# Patient Record
Sex: Female | Born: 1990 | Race: White | Hispanic: No | Marital: Married | Smoking: Never smoker
Health system: Southern US, Community
[De-identification: ages and names within clinical notes are randomized; demographics above are authoritative.]

## PROBLEM LIST (undated history)

## (undated) ENCOUNTER — Inpatient Hospital Stay (HOSPITAL_COMMUNITY): Payer: Self-pay

## (undated) DIAGNOSIS — M797 Fibromyalgia: Secondary | ICD-10-CM

## (undated) DIAGNOSIS — O039 Complete or unspecified spontaneous abortion without complication: Secondary | ICD-10-CM

## (undated) DIAGNOSIS — K589 Irritable bowel syndrome without diarrhea: Secondary | ICD-10-CM

## (undated) DIAGNOSIS — F53 Postpartum depression: Secondary | ICD-10-CM

## (undated) DIAGNOSIS — F3181 Bipolar II disorder: Secondary | ICD-10-CM

## (undated) DIAGNOSIS — J45909 Unspecified asthma, uncomplicated: Secondary | ICD-10-CM

## (undated) DIAGNOSIS — O009 Unspecified ectopic pregnancy without intrauterine pregnancy: Secondary | ICD-10-CM

## (undated) DIAGNOSIS — M1990 Unspecified osteoarthritis, unspecified site: Secondary | ICD-10-CM

## (undated) DIAGNOSIS — E282 Polycystic ovarian syndrome: Secondary | ICD-10-CM

## (undated) DIAGNOSIS — F32A Depression, unspecified: Secondary | ICD-10-CM

## (undated) DIAGNOSIS — F329 Major depressive disorder, single episode, unspecified: Secondary | ICD-10-CM

## (undated) DIAGNOSIS — I7300 Raynaud's syndrome without gangrene: Secondary | ICD-10-CM

## (undated) DIAGNOSIS — F419 Anxiety disorder, unspecified: Secondary | ICD-10-CM

## (undated) DIAGNOSIS — F4310 Post-traumatic stress disorder, unspecified: Secondary | ICD-10-CM

## (undated) HISTORY — DX: Postpartum depression: F53.0

## (undated) HISTORY — DX: Irritable bowel syndrome, unspecified: K58.9

## (undated) HISTORY — PX: OTHER SURGICAL HISTORY: SHX169

## (undated) HISTORY — PX: DIAGNOSTIC LAPAROSCOPY WITH REMOVAL OF ECTOPIC PREGNANCY: SHX6449

## (undated) HISTORY — DX: Complete or unspecified spontaneous abortion without complication: O03.9

## (undated) HISTORY — DX: Unspecified ectopic pregnancy without intrauterine pregnancy: O00.90

---
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
## 2011-08-08 DIAGNOSIS — E039 Hypothyroidism, unspecified: Secondary | ICD-10-CM | POA: Insufficient documentation

## 2012-03-19 ENCOUNTER — Emergency Department (HOSPITAL_COMMUNITY): Admission: EM | Admit: 2012-03-19 | Discharge: 2012-03-19 | Discharge status: Against Medical Advice | Payer: Self-pay

## 2012-03-19 ENCOUNTER — Emergency Department (HOSPITAL_COMMUNITY): Payer: BC Managed Care – PPO

## 2012-03-19 ENCOUNTER — Encounter (HOSPITAL_COMMUNITY): Payer: Self-pay | Admitting: *Deleted

## 2012-03-19 ENCOUNTER — Emergency Department (HOSPITAL_COMMUNITY)
Admission: EM | Admit: 2012-03-19 | Discharge: 2012-03-19 | Disposition: A | Discharge status: Home / Self Care | Payer: BC Managed Care – PPO | Attending: Emergency Medicine | Admitting: Emergency Medicine

## 2012-03-19 DIAGNOSIS — R1011 Right upper quadrant pain: Secondary | ICD-10-CM

## 2012-03-19 DIAGNOSIS — R079 Chest pain, unspecified: Secondary | ICD-10-CM | POA: Insufficient documentation

## 2012-03-19 DIAGNOSIS — J45909 Unspecified asthma, uncomplicated: Secondary | ICD-10-CM | POA: Insufficient documentation

## 2012-03-19 HISTORY — DX: Unspecified asthma, uncomplicated: J45.909

## 2012-03-19 LAB — POCT PREGNANCY, URINE: Preg Test, Ur: NEGATIVE

## 2012-03-19 MED ORDER — SODIUM CHLORIDE 0.9 % IV BOLUS (SEPSIS): 1000.0000 mL | Freq: Once | INTRAVENOUS | Status: DC

## 2012-03-19 MED ORDER — ONDANSETRON HCL 4 MG/2ML IJ SOLN: 4.0000 mg | Freq: Once | INTRAMUSCULAR | Status: DC

## 2012-03-19 MED ORDER — MORPHINE SULFATE 4 MG/ML IJ SOLN: 4.0000 mg | Freq: Once | INTRAMUSCULAR | Status: DC

## 2012-03-19 NOTE — ED Notes (Signed)
Onset 3 days ago, c/o right rib pain esp with movement, no cough per pt.

## 2012-03-19 NOTE — ED Provider Notes (Signed)
History   This chart was scribed for Jamie Octave, MD by Jamie Waters . The patient was seen in room APA11/APA11. Patient's care was started at 1842.    CSN: [id number]  Arrival date & time 03/19/12  1605   First MD Initiated Contact with Patient 03/19/12 1842      Chief Complaint  Patient presents with  . Chest Pain    right rib pain    (Consider location/radiation/quality/duration/timing/severity/associated sxs/prior treatment) HPI Jamie Waters is a 21 y.o. female who presents to the Emergency Department complaining of intermittent, moderate RUQ abdominal pain for the past 3 days. Pt states that the current episode of abdominal pain has been constant since last night. Pt states that her symptoms are aggravated with sitting and relieved with laying down. Pt states that her symptoms are worsening. Patient reports associated constipation and diarrhea that go back and forth. Pt denies any nausea, vomiting, chest pain, back pain, coughing, fever, SOB, dysuria or rashes. Pt states that it doesn't hurt to take a deep breath. Pt denies any b/c or h/o abdominal surgeries. Pt reports a h/o asthma.   Past Medical History  Diagnosis Date  . Asthma     Past Surgical History  Procedure Date  . Cyst removed from throat     History reviewed. No pertinent family history.  History  Substance Use Topics  . Smoking status: Never Smoker   . Smokeless tobacco: Not on file  . Alcohol Use: No    OB History    Grav Para Term Preterm Abortions TAB SAB Ect Mult Living                  Review of Systems A complete 10 system review of systems was obtained and all systems are negative except as noted in the HPI and PMH.   Allergies  Review of patient's allergies indicates no known allergies.  Home Medications   Current Outpatient Rx  Name Route Sig Dispense Refill  . ALBUTEROL SULFATE HFA 108 (90 BASE) MCG/ACT IN AERS Inhalation Inhale 2 puffs into the lungs every 6 (six)  hours as needed.      BP 134/87  Pulse 117  Temp 98.4 F (36.9 C) (Oral)  Resp 18  Ht 5' (1.524 m)  Wt 186 lb (84.369 kg)  BMI 36.33 kg/m2  SpO2 100%  Physical Exam  Nursing note and vitals reviewed. Constitutional: She is oriented to person, place, and time. She appears well-developed and well-nourished. No distress.  HENT:  Head: Normocephalic and atraumatic.  Eyes: EOM are normal. Pupils are equal, round, and reactive to light.  Neck: Normal range of motion. Neck supple. No tracheal deviation present.  Cardiovascular: Normal rate, regular rhythm and normal heart sounds.   Pulmonary/Chest: Effort normal and breath sounds normal. No respiratory distress. She has no wheezes. She exhibits no tenderness.       No rib tenderness.   Abdominal: Soft. Bowel sounds are normal. She exhibits no distension. There is tenderness in the right upper quadrant. There is positive Murphy's sign. There is no rebound, no guarding and no CVA tenderness.       RUQ tenderness with positive Murphy's sign. No CVA tenderness.   Musculoskeletal: Normal range of motion. She exhibits no edema.  Neurological: She is alert and oriented to person, place, and time. No sensory deficit.  Skin: Skin is warm and dry.  Psychiatric: She has a normal mood and affect. Her behavior is normal.    ED  Course  Procedures (including critical care time)  DIAGNOSTIC STUDIES: Oxygen Saturation is 100% on room air, normal by my interpretation.    COORDINATION OF CARE:  18:58-Discussed planned course of treatment with the patient including lab work, CT scan, IV fluids and pain medication, who is agreeable at this time.      Labs Reviewed  POCT PREGNANCY, URINE   Dg Ribs Unilateral W/chest Right  03/19/2012  *RADIOLOGY REPORT*  Clinical Data: Dull chest pain in the right lateral ribs, no injury  RIGHT RIBS AND CHEST - 3+ VIEW  Comparison: None.  Findings: The lungs are clear.  Mediastinal contours appear normal. The  heart is within normal limits in size.  Surgical clips overlie the region of the left thyroid gland.  Right rib detail films show no acute right rib fracture.  IMPRESSION:  1.  No active lung disease. 2.  Negative right rib detail.  Original Report Authenticated By: Juline Patch, M.D.     No diagnosis found.    MDM  Right upper quadrant pain for the past 3 days worse with palpation and movement. Contrary to triage note no chest pain, rib pain, shortness of breath.  Concern for gallbladder pathology. Discussed with patient plan to obtain labs, urinalysis, CT scan as ultrasound is not available.  When nurse went in to start treatment, patient stated she wanted to go home and return tomorrow. She left before I could discuss the situation with her and has left AGAINST MEDICAL ADVICE.   I personally performed the services described in this documentation, which was scribed in my presence.  The recorded information has been reviewed and considered.     Jamie Octave, MD 03/19/12 1924

## 2012-03-19 NOTE — ED Notes (Signed)
Went into patient's room to assess patient and start IV, patient stated she wanted to go home and wanted to wait to have blood work and further testing done in the morning. Notified Dr. Manus Gunning, patient signed out AMA. Notified patient of risks and benefits of the possible treatment tonight and that she would have to check in to the ER again tomorrow if she wanted to be seen. Patient verbalized understanding.

## 2013-09-03 DIAGNOSIS — I7300 Raynaud's syndrome without gangrene: Secondary | ICD-10-CM

## 2013-09-03 DIAGNOSIS — E282 Polycystic ovarian syndrome: Secondary | ICD-10-CM

## 2013-09-03 HISTORY — DX: Polycystic ovarian syndrome: E28.2

## 2013-09-03 HISTORY — DX: Raynaud's syndrome without gangrene: I73.00

## 2013-12-31 ENCOUNTER — Emergency Department (HOSPITAL_COMMUNITY)
Admission: EM | Admit: 2013-12-31 | Discharge: 2013-12-31 | Disposition: A | Discharge status: Home / Self Care | Payer: BC Managed Care – PPO | Attending: Emergency Medicine | Admitting: Emergency Medicine

## 2013-12-31 ENCOUNTER — Encounter (HOSPITAL_COMMUNITY): Payer: Self-pay | Admitting: Emergency Medicine

## 2013-12-31 ENCOUNTER — Emergency Department (HOSPITAL_COMMUNITY): Payer: BC Managed Care – PPO

## 2013-12-31 DIAGNOSIS — T50905A Adverse effect of unspecified drugs, medicaments and biological substances, initial encounter: Secondary | ICD-10-CM

## 2013-12-31 DIAGNOSIS — R0789 Other chest pain: Secondary | ICD-10-CM | POA: Insufficient documentation

## 2013-12-31 DIAGNOSIS — R062 Wheezing: Secondary | ICD-10-CM | POA: Insufficient documentation

## 2013-12-31 DIAGNOSIS — T380X5A Adverse effect of glucocorticoids and synthetic analogues, initial encounter: Secondary | ICD-10-CM | POA: Insufficient documentation

## 2013-12-31 DIAGNOSIS — Z3202 Encounter for pregnancy test, result negative: Secondary | ICD-10-CM | POA: Insufficient documentation

## 2013-12-31 DIAGNOSIS — Z79899 Other long term (current) drug therapy: Secondary | ICD-10-CM | POA: Insufficient documentation

## 2013-12-31 DIAGNOSIS — T443X5A Adverse effect of other parasympatholytics [anticholinergics and antimuscarinics] and spasmolytics, initial encounter: Secondary | ICD-10-CM | POA: Insufficient documentation

## 2013-12-31 DIAGNOSIS — J45909 Unspecified asthma, uncomplicated: Secondary | ICD-10-CM | POA: Insufficient documentation

## 2013-12-31 DIAGNOSIS — T486X5A Adverse effect of antiasthmatics, initial encounter: Secondary | ICD-10-CM | POA: Insufficient documentation

## 2013-12-31 DIAGNOSIS — I7300 Raynaud's syndrome without gangrene: Secondary | ICD-10-CM | POA: Insufficient documentation

## 2013-12-31 HISTORY — DX: Raynaud's syndrome without gangrene: I73.00

## 2013-12-31 LAB — POC URINE PREG, ED: Preg Test, Ur: NEGATIVE

## 2013-12-31 MED ORDER — PREDNISONE 50 MG PO TABS: ORAL_TABLET | ORAL | Status: DC

## 2013-12-31 MED ORDER — ALBUTEROL SULFATE (2.5 MG/3ML) 0.083% IN NEBU
2.5000 mg | INHALATION_SOLUTION | Freq: Once | RESPIRATORY_TRACT | Status: AC
  Administered 2013-12-31: 2.5 mg via RESPIRATORY_TRACT
  Filled 2013-12-31: qty 3

## 2013-12-31 MED ORDER — DIPHENHYDRAMINE HCL 25 MG PO CAPS
25.0000 mg | ORAL_CAPSULE | Freq: Once | ORAL | Status: AC
  Administered 2013-12-31: 25 mg via ORAL
  Filled 2013-12-31: qty 1

## 2013-12-31 MED ORDER — ALBUTEROL SULFATE HFA 108 (90 BASE) MCG/ACT IN AERS
2.0000 | INHALATION_SPRAY | RESPIRATORY_TRACT | Status: DC
  Administered 2013-12-31: 2 via RESPIRATORY_TRACT
  Filled 2013-12-31: qty 6.7

## 2013-12-31 MED ORDER — IPRATROPIUM-ALBUTEROL 0.5-2.5 (3) MG/3ML IN SOLN
3.0000 mL | Freq: Once | RESPIRATORY_TRACT | Status: AC
  Administered 2013-12-31: 3 mL via RESPIRATORY_TRACT
  Filled 2013-12-31: qty 3

## 2013-12-31 MED ORDER — PREDNISONE 10 MG PO TABS
60.0000 mg | ORAL_TABLET | Freq: Once | ORAL | Status: AC
  Administered 2013-12-31: 60 mg via ORAL
  Filled 2013-12-31 (×2): qty 1

## 2013-12-31 NOTE — ED Provider Notes (Signed)
CSN: [id number]     Arrival date & time 12/31/13  1439 History   First MD Initiated Contact with Patient 12/31/13 1638     Chief Complaint  Patient presents with  . Shortness of Breath     (Consider location/radiation/quality/duration/timing/severity/associated sxs/prior Treatment) HPI Comments: Jamie Waters is a 23 y.o. Female presenting with probable allergic reaction to norvasc which she started 2 days ago for treatment of her Reynauds disease.  She took her first dose 2 nights ago and within 15 minutes noticed chest tightness, shortness of breath and wheezing.  The symptoms resolved until last night when she took her next dose.  Her wheezing is currently resolved, but still feels chest tightness.  She does have a distant history of asthma but not in recent years.  She contacted her rheumatologist who advised to present here and to dc her norvasc.  She denies chest pain,  Peripheral edema and lower extremity pain.  Also denies fevers or chills, nausea or vomiting.  She has had no mouth, lip or throat swelling.     The history is provided by the patient.    Past Medical History  Diagnosis Date  . Asthma   . Raynauds syndrome    Past Surgical History  Procedure Laterality Date  . Cyst removed from throat     No family history on file. History  Substance Use Topics  . Smoking status: Never Smoker   . Smokeless tobacco: Not on file  . Alcohol Use: No   OB History   Grav Para Term Preterm Abortions TAB SAB Ect Mult Living                 Review of Systems  Constitutional: Negative for fever.  HENT: Negative for congestion and sore throat.   Eyes: Negative.   Respiratory: Positive for chest tightness, shortness of breath and wheezing.   Cardiovascular: Negative for chest pain, palpitations and leg swelling.  Gastrointestinal: Negative for nausea and abdominal pain.  Genitourinary: Negative.   Musculoskeletal: Negative for arthralgias, joint swelling and neck pain.   Skin: Negative.  Negative for rash and wound.  Neurological: Negative for dizziness, weakness, light-headedness, numbness and headaches.  Psychiatric/Behavioral: Negative.       Allergies  Review of patient's allergies indicates no known allergies.  Home Medications   Prior to Admission medications   Medication Sig Start Date End Date Taking? Authorizing Provider  ALPRAZolam Prudy Feeler(XANAX) 0.5 MG tablet Take 0.5 mg by mouth at bedtime.   Yes Historical Provider, MD  amLODipine (NORVASC) 5 MG tablet Take 5 mg by mouth daily.   Yes Historical Provider, MD  buPROPion (WELLBUTRIN SR) 150 MG 12 hr tablet Take 300 mg by mouth daily.   Yes Historical Provider, MD  cyclobenzaprine (FLEXERIL) 10 MG tablet Take 5 mg by mouth at bedtime.   Yes Historical Provider, MD  SUMAtriptan (IMITREX) 100 MG tablet Take 100 mg by mouth every 2 (two) hours as needed for migraine or headache. May repeat in 2 hours if headache persists or recurs.   Yes Historical Provider, MD  topiramate (TOPAMAX) 50 MG tablet Take 200 mg by mouth at bedtime.   Yes Historical Provider, MD  Vitamin D, Ergocalciferol, (DRISDOL) 50000 UNITS CAPS capsule Take 50,000 Units by mouth every 7 (seven) days. Takes on Thursday   Yes Historical Provider, MD   BP 123/76  Pulse 107  Temp(Src) 97.7 F (36.5 C) (Oral)  Resp 18  SpO2 99% Physical Exam  Nursing note and  vitals reviewed. Constitutional: She appears well-developed and well-nourished.  HENT:  Head: Normocephalic and atraumatic.  Mouth/Throat: Uvula is midline, oropharynx is clear and moist and mucous membranes are normal. No posterior oropharyngeal edema.  Eyes: Conjunctivae are normal.  Neck: Normal range of motion.  Cardiovascular: Normal rate, regular rhythm, normal heart sounds and intact distal pulses.   Pulmonary/Chest: Effort normal and breath sounds normal. No respiratory distress. She has no wheezes. She exhibits no tenderness.  Slightly prolonged expiratory phase  without wheeze.  Abdominal: Soft. Bowel sounds are normal. There is no tenderness.  Musculoskeletal: Normal range of motion.  Neurological: She is alert.  Skin: Skin is warm and dry.  Psychiatric: She has a normal mood and affect.    ED Course  Procedures (including critical care time) Labs Review Labs Reviewed  POC URINE PREG, ED    Imaging Review Dg Chest 2 View  12/31/2013   CLINICAL DATA:  sob  EXAM: CHEST  2 VIEW  COMPARISON:  DG RIBS UNILATERAL W/CHEST*R* dated 03/19/2012  FINDINGS: The heart size and mediastinal contours are within normal limits. Both lungs are clear. The visualized skeletal structures are unremarkable.  IMPRESSION: No active cardiopulmonary disease.   Electronically Signed   By: Salome HolmesHector  Cooper M.D.   On: 12/31/2013 18:26     EKG Interpretation None      MDM   Final diagnoses:  Medication adverse effect    Pt was given albuterol/atrovent neb, prednisone 60 mg PO with resolution of chest tightness and sob.  At re-exam her respiratory exam was normal with no wheeze, no prolonged expirations, good air movement.  Short course prednisone pulse dose,  Albuterol mdi, dc norvasc,  Call her rheumatologist tomorrow for further guidance regarding alternative medication.      Burgess AmorJulie Jala Dundon, PA-C 12/31/13 1845

## 2013-12-31 NOTE — ED Notes (Signed)
Allergic reaction to Norvasc per pt.  Reports 2 days . Feels tightness in chest and " cant catch my breath" speak complete sentences. nad noted

## 2013-12-31 NOTE — Discharge Instructions (Signed)

## 2013-12-31 NOTE — ED Provider Notes (Signed)
Medical screening examination/treatment/procedure(s) were performed by non-physician practitioner and as supervising physician I was immediately available for consultation/collaboration.   EKG Interpretation None      Devoria AlbeIva Norvel Wenker, MD, Armando GangFACEP   Ward GivensIva L Hiroko Tregre, MD 12/31/13 [phone]

## 2014-10-27 ENCOUNTER — Inpatient Hospital Stay (HOSPITAL_COMMUNITY)
Admission: AD | Admit: 2014-10-27 | Discharge: 2014-10-27 | Disposition: A | Discharge status: Home / Self Care | Payer: BLUE CROSS/BLUE SHIELD | Source: Ambulatory Visit | Attending: Obstetrics | Admitting: Obstetrics

## 2014-10-27 ENCOUNTER — Inpatient Hospital Stay (HOSPITAL_COMMUNITY): Payer: BLUE CROSS/BLUE SHIELD

## 2014-10-27 ENCOUNTER — Encounter (HOSPITAL_COMMUNITY): Payer: Self-pay | Admitting: *Deleted

## 2014-10-27 DIAGNOSIS — O4691 Antepartum hemorrhage, unspecified, first trimester: Secondary | ICD-10-CM | POA: Diagnosis not present

## 2014-10-27 DIAGNOSIS — Z3A01 Less than 8 weeks gestation of pregnancy: Secondary | ICD-10-CM | POA: Diagnosis not present

## 2014-10-27 DIAGNOSIS — O2341 Unspecified infection of urinary tract in pregnancy, first trimester: Secondary | ICD-10-CM | POA: Diagnosis not present

## 2014-10-27 DIAGNOSIS — N390 Urinary tract infection, site not specified: Secondary | ICD-10-CM

## 2014-10-27 DIAGNOSIS — O209 Hemorrhage in early pregnancy, unspecified: Secondary | ICD-10-CM | POA: Diagnosis present

## 2014-10-27 HISTORY — DX: Fibromyalgia: M79.7

## 2014-10-27 HISTORY — DX: Polycystic ovarian syndrome: E28.2

## 2014-10-27 HISTORY — DX: Unspecified osteoarthritis, unspecified site: M19.90

## 2014-10-27 LAB — CBC
HCT: 39.4 % (ref 36.0–46.0)
Hemoglobin: 13 g/dL (ref 12.0–15.0)
MCH: 30.5 pg (ref 26.0–34.0)
MCHC: 33 g/dL (ref 30.0–36.0)
MCV: 92.5 fL (ref 78.0–100.0)
Platelets: 337 10*3/uL (ref 150–400)
RBC: 4.26 MIL/uL (ref 3.87–5.11)
RDW: 13.2 % (ref 11.5–15.5)
WBC: 12 10*3/uL — ABNORMAL HIGH (ref 4.0–10.5)

## 2014-10-27 LAB — URINALYSIS, ROUTINE W REFLEX MICROSCOPIC
Bilirubin Urine: NEGATIVE
Glucose, UA: NEGATIVE mg/dL
Ketones, ur: 15 mg/dL — AB
Nitrite: NEGATIVE
Protein, ur: NEGATIVE mg/dL
Specific Gravity, Urine: 1.025 (ref 1.005–1.030)
Urobilinogen, UA: 0.2 mg/dL (ref 0.0–1.0)
pH: 6 (ref 5.0–8.0)

## 2014-10-27 LAB — WET PREP, GENITAL
Trich, Wet Prep: NONE SEEN
Yeast Wet Prep HPF POC: NONE SEEN

## 2014-10-27 LAB — URINE MICROSCOPIC-ADD ON

## 2014-10-27 LAB — POCT PREGNANCY, URINE: Preg Test, Ur: POSITIVE — AB

## 2014-10-27 LAB — HCG, QUANTITATIVE, PREGNANCY: hCG, Beta Chain, Quant, S: 401 m[IU]/mL — ABNORMAL HIGH (ref <5)

## 2014-10-27 MED ORDER — RHO D IMMUNE GLOBULIN 1500 UNIT/2ML IJ SOSY
300.0000 ug | PREFILLED_SYRINGE | Freq: Once | INTRAMUSCULAR | Status: AC
  Administered 2014-10-27: 300 ug via INTRAMUSCULAR
  Filled 2014-10-27: qty 2

## 2014-10-27 MED ORDER — CEPHALEXIN 500 MG PO CAPS: 500.0000 mg | ORAL_CAPSULE | Freq: Four times a day (QID) | ORAL | Status: DC

## 2014-10-27 MED ORDER — PHENAZOPYRIDINE HCL 200 MG PO TABS: 200.0000 mg | ORAL_TABLET | Freq: Three times a day (TID) | ORAL | Status: DC

## 2014-10-27 NOTE — MAU Provider Note (Signed)
History     CSN: [id number]  Arrival date and time: 10/27/14 1611   First Provider Initiated Contact with Patient 10/27/14 1822      Chief Complaint  Patient presents with  . Abdominal Pain  . Vaginal Bleeding   HPI   Jamie Waters is a 24 y.o. female G1P0 at [redacted]w[redacted]d who presents with vaginal bleeding. The bleeding started last night around 9:00 pm, she called the Dr. Isidore Moos today and described it as light. This afternoon the bleeding picked up to heavier than spotting and it is now bright red.   She denies abdominal pain however does have some lower back pain.   OB History    Gravida Para Term Preterm AB TAB SAB Ectopic Multiple Living   1         0      Past Medical History  Diagnosis Date  . Asthma   . Raynauds syndrome   . Raynauds syndrome 2015  . Fibromyalgia   . Arthritis   . PCOS (polycystic ovarian syndrome) 2015    Past Surgical History  Procedure Laterality Date  . Cyst removed from throat    . Thyroidectomy, partial  2010    History reviewed. No pertinent family history.  History  Substance Use Topics  . Smoking status: Never Smoker   . Smokeless tobacco: Not on file  . Alcohol Use: No    Allergies:  Allergies  Allergen Reactions  . Norvasc [Amlodipine Besylate] Shortness Of Breath and Other (See Comments)    Caused tightness in chest.    No prescriptions prior to admission   Results for orders placed or performed during the hospital encounter of 10/27/14 (from the past 48 hour(s))  Urinalysis, Routine w reflex microscopic     Status: Abnormal   Collection Time: 10/27/14  4:40 PM  Result Value Ref Range   Color, Urine YELLOW YELLOW   APPearance CLEAR CLEAR   Specific Gravity, Urine 1.025 1.005 - 1.030   pH 6.0 5.0 - 8.0   Glucose, UA NEGATIVE NEGATIVE mg/dL   Hgb urine dipstick LARGE (A) NEGATIVE   Bilirubin Urine NEGATIVE NEGATIVE   Ketones, ur 15 (A) NEGATIVE mg/dL   Protein, ur NEGATIVE NEGATIVE mg/dL   Urobilinogen, UA 0.2  0.0 - 1.0 mg/dL   Nitrite NEGATIVE NEGATIVE   Leukocytes, UA SMALL (A) NEGATIVE  Urine microscopic-add on     Status: Abnormal   Collection Time: 10/27/14  4:40 PM  Result Value Ref Range   Squamous Epithelial / LPF RARE RARE   WBC, UA 11-20 <3 WBC/hpf   RBC / HPF 21-50 <3 RBC/hpf   Bacteria, UA MANY (A) RARE  Pregnancy, urine POC     Status: Abnormal   Collection Time: 10/27/14  4:46 PM  Result Value Ref Range   Preg Test, Ur POSITIVE (A) NEGATIVE    Comment:        THE SENSITIVITY OF THIS METHODOLOGY IS >24 mIU/mL   Wet prep, genital     Status: Abnormal   Collection Time: 10/27/14  6:27 PM  Result Value Ref Range   Yeast Wet Prep HPF POC NONE SEEN NONE SEEN   Trich, Wet Prep NONE SEEN NONE SEEN   Clue Cells Wet Prep HPF POC FEW (A) NONE SEEN   WBC, Wet Prep HPF POC FEW (A) NONE SEEN    Comment: FEW BACTERIA SEEN  CBC     Status: Abnormal   Collection Time: 10/27/14  9:15 PM  Result  Value Ref Range   WBC 12.0 (H) 4.0 - 10.5 K/uL   RBC 4.26 3.87 - 5.11 MIL/uL   Hemoglobin 13.0 12.0 - 15.0 g/dL   HCT 21.3 08.6 - 57.8 %   MCV 92.5 78.0 - 100.0 fL   MCH 30.5 26.0 - 34.0 pg   MCHC 33.0 30.0 - 36.0 g/dL   RDW 46.9 62.9 - 52.8 %   Platelets 337 150 - 400 K/uL  hCG, quantitative, pregnancy     Status: Abnormal   Collection Time: 10/27/14  9:15 PM  Result Value Ref Range   hCG, Beta Chain, Quant, S 401 (H) <5 mIU/mL    Comment:          GEST. AGE      CONC.  (mIU/mL)   <=1 WEEK        5 - 50     2 WEEKS       50 - 500     3 WEEKS       100 - 10,000     4 WEEKS     1,000 - 30,000     5 WEEKS     3,500 - 115,000   6-8 WEEKS     12,000 - 270,000    12 WEEKS     15,000 - 220,000        FEMALE AND NON-PREGNANT FEMALE:     LESS THAN 5 mIU/mL   ABO/Rh     Status: None (Preliminary result)   Collection Time: 10/27/14  9:15 PM  Result Value Ref Range   ABO/RH(D) A NEG   Rh IG workup (includes ABO/Rh)     Status: None (Preliminary result)   Collection Time: 10/27/14  9:15 PM   Result Value Ref Range   Gestational Age(Wks) 5    ABO/RH(D) A NEG    Antibody Screen NEG    Unit Number [id number]    Blood Component Type RHIG    Unit division 00    Status of Unit ISSUED    Transfusion Status OK TO TRANSFUSE    US Ob Comp Less 14 Wks  10/27/2014   CLINICAL DATA:  Spotting last night and today. Bleeding heavier today. Mild cramping. Positive urine pregnancy test. Estimated gestational age by LMP is 5 weeks 4 days.  EXAM: OBSTETRIC <14 WK [place] AND TRANSVAGINAL OB US  TECHNIQUE: Both transabdominal and transvaginal ultrasound examinations were performed for complete evaluation of the gestation as well as the maternal uterus, adnexal regions, and pelvic cul-de-sac. Transvaginal technique was performed to assess early pregnancy.  COMPARISON:  None.  FINDINGS: Intrauterine gestational sac: There is a tiny intrauterine fluid collection which may represent an early intrauterine gestational sac. This is equivocal. No definite decidual reaction is noted.  Yolk sac:  Not identified  Embryo:  Not identified  Cardiac Activity: Not identified  MSD: 3.5  mm   4 w   6  d                [place] EDC: 06/30/2015  Maternal uterus/adnexae: Uterus is anteverted. No myometrial masses. Cervix is normal. Both ovaries are identified. Complex cystic structure in the right ovary with surrounding flow on color flow Doppler imaging likely represents a corpus luteal cyst. No free fluid.  IMPRESSION: Possible early intrauterine gestational sac, but no yolk sac, fetal pole, or cardiac activity yet visualized. Recommend follow-up quantitative B-HCG levels and follow-up US in 14 days to confirm and assess viability. This recommendation follows SRU consensus guidelines:  Diagnostic Criteria for Nonviable Pregnancy Early in the First Trimester. Malva Limes Engl J Med 2013; 161:0960-45; 369:1443-51.   Electronically Signed   By: Burman NievesWilliam  Stevens M.D.   On: 10/27/2014 20:07   [place] Ob Transvaginal  10/27/2014   CLINICAL DATA:  Spotting last night  and today. Bleeding heavier today. Mild cramping. Positive urine pregnancy test. Estimated gestational age by LMP is 5 weeks 4 days.  EXAM: OBSTETRIC <14 WK US AND TRANSVAGINAL OB US  TECHNIQUE: Both transabdominal and transvaginal ultrasound examinations were performed for complete evaluation of the gestation as well as the maternal uterus, adnexal regions, and pelvic cul-de-sac. Transvaginal technique was performed to assess early pregnancy.  COMPARISON:  None.  FINDINGS: Intrauterine gestational sac: There is a tiny intrauterine fluid collection which may represent an early intrauterine gestational sac. This is equivocal. No definite decidual reaction is noted.  Yolk sac:  Not identified  Embryo:  Not identified  Cardiac Activity: Not identified  MSD: 3.5  mm   4 w   6  d                [place] EDC: 06/30/2015  Maternal uterus/adnexae: Uterus is anteverted. No myometrial masses. Cervix is normal. Both ovaries are identified. Complex cystic structure in the right ovary with surrounding flow on color flow Doppler imaging likely represents a corpus luteal cyst. No free fluid.  IMPRESSION: Possible early intrauterine gestational sac, but no yolk sac, fetal pole, or cardiac activity yet visualized. Recommend follow-up quantitative B-HCG levels and follow-up US in 14 days to confirm and assess viability. This recommendation follows SRU consensus guidelines: Diagnostic Criteria for Nonviable Pregnancy Early in the First Trimester. Malva Limes Engl J Med 2013; 409:8119-14; 369:1443-51.   Electronically Signed   By: Burman NievesWilliam  Stevens M.D.   On: 10/27/2014 20:07     Review of Systems  Constitutional: Negative for fever and chills.  Gastrointestinal: Positive for nausea. Negative for abdominal pain.  Genitourinary: Negative for dysuria, urgency and frequency.  Musculoskeletal: Positive for back pain (Both sides of lower back. ).   Physical Exam   Blood pressure 129/71, pulse 125, temperature 98.9 F (37.2 C), temperature source Oral, resp.  rate 16, height 5' (1.524 m), weight 177 lb (80.287 kg), last menstrual period 09/18/2014, SpO2 100 %.  Physical Exam  Constitutional: She is oriented to person, place, and time. She appears well-developed and well-nourished. No distress.  HENT:  Head: Normocephalic.  Eyes: Pupils are equal, round, and reactive to light.  Neck: Neck supple.  Respiratory: Effort normal.  GI: Soft. She exhibits no distension. There is tenderness in the suprapubic area. There is no rigidity and no guarding.  Genitourinary:  Speculum exam: Vagina - Moderate amount of dark red blood pooling in the vagina.  Cervix -+ active bleeding from cervix  Bimanual exam: Cervix closed, no CMT  Uterus non tender, normal size Adnexa non tender, no masses bilaterally, +suprapubic tenderness  GC/Chlam, wet prep done Chaperone present for exam.   Musculoskeletal: Normal range of motion.  Neurological: She is alert and oriented to person, place, and time.  Skin: Skin is warm. She is not diaphoretic.  Psychiatric: Her behavior is normal.    MAU Course  Procedures  None  MDM UA Urine culture sent  US ABO Quant CBC Wet prep GC HIV   Report given to Vonzella NippleJulie Katianne Barre [facility] who resumes care of the patient; patient is currently in US.   Iona HansenJennifer Irene Rasch, NP 10/27/2014 7:51 PM  MDM Discussed patient with Dr. Chestine Sporelark,  advised of delay in lab due to difficult draw. She recommends that we at least check ABO/Rh and quant hCG.  A negative blood type - Rhogam work-up and injection ordered Rhogam given today  Assessment and Plan  A: [redacted]w[redacted]d IUGS without YS or FP Vaginal bleeding in pregnancy UTI in pregnancy   P: Discharge home Rhogam given in MAU today  Bleeding precautions discussed Patient advised to return to MAU for follow-up labs on Saturday morning or sooner PRN  Marny Lowenstein, PA-C 10/28/2014 12:32 AM

## 2014-10-27 NOTE — MAU Note (Signed)
Noted light spotting last night, still spotting this a.m.  Mild cramping today.  States bleeding is heavier this afternoon.  Nauseated, but no vomiting or diarrhea today.  Feels flushed, hasn't taken temp @ home.

## 2014-10-27 NOTE — Discharge Instructions (Signed)
Pelvic Rest °Pelvic rest is sometimes recommended for women when:  °· The placenta is partially or completely covering the opening of the cervix (placenta previa). °· There is bleeding between the uterine wall and the amniotic sac in the first trimester (subchorionic hemorrhage). °· The cervix begins to open without labor starting (incompetent cervix, cervical insufficiency). °· The labor is too early (preterm labor). °HOME CARE INSTRUCTIONS °· Do not have sexual intercourse, stimulation, or an orgasm. °· Do not use tampons, douche, or put anything in the vagina. °· Do not lift anything over 10 pounds (4.5 kg). °· Avoid strenuous activity or straining your pelvic muscles. °SEEK MEDICAL CARE IF:  °· You have any vaginal bleeding during pregnancy. Treat this as a potential emergency. °· You have cramping pain felt low in the stomach (stronger than menstrual cramps). °· You notice vaginal discharge (watery, mucus, or bloody). °· You have a low, dull backache. °· There are regular contractions or uterine tightening. °SEEK IMMEDIATE MEDICAL CARE IF: °You have vaginal bleeding and have placenta previa.  °Document Released: 12/15/2010 Document Revised: 11/12/2011 Document Reviewed: 12/15/2010 °ExitCare® Patient Information ©2015 [employer]. This information is not intended to replace advice given to you by your health care provider. Make sure you discuss any questions you have with your health care provider. ° °Vaginal Bleeding During Pregnancy, First Trimester °A small amount of bleeding (spotting) from the vagina is common in early pregnancy. Sometimes the bleeding is normal and is not a problem, and sometimes it is a sign of something serious. Be sure to tell your doctor about any bleeding from your vagina right away. °HOME CARE °· Watch your condition for any changes. °· Follow your doctor's instructions about how active you can be. °· If you are on bed rest: °¨ You may need to stay in bed and only get up to use the  bathroom. °¨ You may be allowed to do some activities. °¨ If you need help, make plans for someone to help you. °· Write down: °¨ The number of pads you use each day. °¨ How often you change pads. °¨ How soaked (saturated) your pads are. °· Do not use tampons. °· Do not douche. °· Do not have sex or orgasms until your doctor says it is okay. °· If you pass any tissue from your vagina, save the tissue so you can show it to your doctor. °· Only take medicines as told by your doctor. °· Do not take aspirin because it can make you bleed. °· Keep all follow-up visits as told by your doctor. °GET HELP IF:  °· You bleed from your vagina. °· You have cramps. °· You have labor pains. °· You have a fever that does not go away after you take medicine. °GET HELP RIGHT AWAY IF:  °· You have very bad cramps in your back or belly (abdomen). °· You pass large clots or tissue from your vagina. °· You bleed more. °· You feel light-headed or weak. °· You pass out (faint). °· You have chills. °· You are leaking fluid or have a gush of fluid from your vagina. °· You pass out while pooping (having a bowel movement). °MAKE SURE YOU: °· Understand these instructions. °· Will watch your condition. °· Will get help right away if you are not doing well or get worse. °Document Released: 01/04/2014 Document Reviewed: 04/27/2013 °ExitCare® Patient Information ©2015 [employer]. This information is not intended to replace advice given to you by your health care provider. Make   sure you discuss any questions you have with your health care provider. ° °

## 2014-10-27 NOTE — MAU Note (Signed)
Pos HPT on Sunday, also pos UPT @ PCP office.

## 2014-10-28 LAB — GC/CHLAMYDIA PROBE AMP (~~LOC~~) NOT AT ARMC
Chlamydia: NEGATIVE
Neisseria Gonorrhea: NEGATIVE

## 2014-10-28 LAB — ABO/RH: ABO/RH(D): A NEG

## 2014-10-29 ENCOUNTER — Observation Stay (HOSPITAL_COMMUNITY)
Admission: AD | Admit: 2014-10-29 | Discharge: 2014-10-30 | Disposition: A | Discharge status: Home / Self Care | Payer: BLUE CROSS/BLUE SHIELD | Source: Ambulatory Visit | Attending: Obstetrics and Gynecology | Admitting: Obstetrics and Gynecology

## 2014-10-29 ENCOUNTER — Encounter (HOSPITAL_COMMUNITY): Payer: Self-pay | Admitting: *Deleted

## 2014-10-29 DIAGNOSIS — R109 Unspecified abdominal pain: Secondary | ICD-10-CM | POA: Insufficient documentation

## 2014-10-29 DIAGNOSIS — O99511 Diseases of the respiratory system complicating pregnancy, first trimester: Secondary | ICD-10-CM | POA: Insufficient documentation

## 2014-10-29 DIAGNOSIS — J45909 Unspecified asthma, uncomplicated: Secondary | ICD-10-CM | POA: Insufficient documentation

## 2014-10-29 DIAGNOSIS — O9989 Other specified diseases and conditions complicating pregnancy, childbirth and the puerperium: Secondary | ICD-10-CM | POA: Insufficient documentation

## 2014-10-29 DIAGNOSIS — Z3A01 Less than 8 weeks gestation of pregnancy: Secondary | ICD-10-CM | POA: Insufficient documentation

## 2014-10-29 DIAGNOSIS — I7300 Raynaud's syndrome without gangrene: Secondary | ICD-10-CM | POA: Diagnosis not present

## 2014-10-29 DIAGNOSIS — R0602 Shortness of breath: Secondary | ICD-10-CM | POA: Diagnosis not present

## 2014-10-29 DIAGNOSIS — O99281 Endocrine, nutritional and metabolic diseases complicating pregnancy, first trimester: Secondary | ICD-10-CM | POA: Diagnosis not present

## 2014-10-29 DIAGNOSIS — E282 Polycystic ovarian syndrome: Secondary | ICD-10-CM | POA: Insufficient documentation

## 2014-10-29 DIAGNOSIS — M1990 Unspecified osteoarthritis, unspecified site: Secondary | ICD-10-CM | POA: Insufficient documentation

## 2014-10-29 DIAGNOSIS — O26891 Other specified pregnancy related conditions, first trimester: Secondary | ICD-10-CM | POA: Diagnosis not present

## 2014-10-29 DIAGNOSIS — R102 Pelvic and perineal pain: Secondary | ICD-10-CM | POA: Insufficient documentation

## 2014-10-29 DIAGNOSIS — O99411 Diseases of the circulatory system complicating pregnancy, first trimester: Secondary | ICD-10-CM | POA: Insufficient documentation

## 2014-10-29 DIAGNOSIS — O209 Hemorrhage in early pregnancy, unspecified: Secondary | ICD-10-CM | POA: Diagnosis not present

## 2014-10-29 DIAGNOSIS — O009 Unspecified ectopic pregnancy without intrauterine pregnancy: Secondary | ICD-10-CM | POA: Diagnosis present

## 2014-10-29 LAB — CBC
HCT: 36.8 % (ref 36.0–46.0)
Hemoglobin: 12.3 g/dL (ref 12.0–15.0)
MCH: 30.7 pg (ref 26.0–34.0)
MCHC: 33.4 g/dL (ref 30.0–36.0)
MCV: 91.8 fL (ref 78.0–100.0)
Platelets: 332 10*3/uL (ref 150–400)
RBC: 4.01 MIL/uL (ref 3.87–5.11)
RDW: 13.1 % (ref 11.5–15.5)
WBC: 12.9 10*3/uL — ABNORMAL HIGH (ref 4.0–10.5)

## 2014-10-29 LAB — URINALYSIS, ROUTINE W REFLEX MICROSCOPIC
Bilirubin Urine: NEGATIVE
Glucose, UA: 100 mg/dL — AB
Ketones, ur: NEGATIVE mg/dL
Nitrite: POSITIVE — AB
Protein, ur: 30 mg/dL — AB
Specific Gravity, Urine: 1.015 (ref 1.005–1.030)
Urobilinogen, UA: 1 mg/dL (ref 0.0–1.0)
pH: 7 (ref 5.0–8.0)

## 2014-10-29 LAB — CULTURE, OB URINE
Colony Count: 10000
Special Requests: NORMAL

## 2014-10-29 LAB — RH IG WORKUP (INCLUDES ABO/RH)
ABO/RH(D): A NEG
Antibody Screen: NEGATIVE
Gestational Age(Wks): 5
Unit division: 0

## 2014-10-29 LAB — URINE MICROSCOPIC-ADD ON

## 2014-10-29 LAB — HCG, QUANTITATIVE, PREGNANCY: hCG, Beta Chain, Quant, S: 449 m[IU]/mL — ABNORMAL HIGH (ref <5)

## 2014-10-29 MED ORDER — HYDROXYZINE HCL 50 MG/ML IM SOLN
50.0000 mg | Freq: Once | INTRAMUSCULAR | Status: AC
  Administered 2014-10-29: 50 mg via INTRAMUSCULAR
  Filled 2014-10-29: qty 1

## 2014-10-29 NOTE — MAU Provider Note (Signed)
History     CSN: [id number]  Arrival date and time: 10/29/14 2211   First Provider Initiated Contact with Patient 10/29/14 2236      Chief Complaint  Patient presents with  . Shortness of Breath  . Abdominal Cramping  . Vaginal Bleeding  . Back Pain   HPI   Ms. Jamie Waters is a 24 y.o. female G1P0 at [redacted]w[redacted]d who presents with worsening abdominal pain.  She sas seen 2 days ago with abdominal pain and vaginal bleeding; she was sent home with ectopic precautions.  She presents with pain that has worsened in her right side; it radiates to her back. The pain worsened earlier today. She describes the pain as "come and go." She currently rates her pain 8/10; pain is worse in her lower back .  Her bleeding is light; she notices it only when she wipes.  She also complains of shortness of breath that she has had all day. She feels like she cannot catch her breath. She has a history of anxiety and anxiety attacks. She feels the SOB is related to anxiety because she is very worried about the pregnancy.   OB History    Gravida Para Term Preterm AB TAB SAB Ectopic Multiple Living   1         0      Past Medical History  Diagnosis Date  . Asthma   . Raynauds syndrome   . Raynauds syndrome 2015  . Fibromyalgia   . Arthritis   . PCOS (polycystic ovarian syndrome) 2015  . Raynaud disease     Past Surgical History  Procedure Laterality Date  . Cyst removed from throat    . Thyroidectomy, partial  2010    Family History  Problem Relation Age of Onset  . Lupus Maternal Grandmother   . Cancer Maternal Grandmother   . Cancer Paternal Grandfather     History  Substance Use Topics  . Smoking status: Never Smoker   . Smokeless tobacco: Not on file  . Alcohol Use: No    Allergies:  Allergies  Allergen Reactions  . Norvasc [Amlodipine Besylate] Shortness Of Breath and Other (See Comments)    Caused tightness in chest.    Prescriptions prior to admission  Medication Sig  Dispense Refill Last Dose  . ALPRAZolam (XANAX) 0.5 MG tablet Take 0.5 mg by mouth at bedtime.   Past Week at Unknown time  . cephALEXin (KEFLEX) 500 MG capsule Take 1 capsule (500 mg total) by mouth 4 (four) times daily. 20 capsule 0 10/29/2014 at Unknown time  . cyclobenzaprine (FLEXERIL) 10 MG tablet Take 5 mg by mouth at bedtime.   Past Week at Unknown time  . hydroxychloroquine (PLAQUENIL) 200 MG tablet Take 200 mg by mouth 2 (two) times daily.   Past Week at Unknown time  . phenazopyridine (PYRIDIUM) 200 MG tablet Take 1 tablet (200 mg total) by mouth 3 (three) times daily. 6 tablet 0 10/29/2014 at Unknown time  . Prenatal Vit-Fe Fumarate-FA (PRENATAL MULTIVITAMIN) TABS tablet Take 1 tablet by mouth daily at 12 noon.   10/29/2014 at Unknown time  . traMADol (ULTRAM) 50 MG tablet Take 50 mg by mouth 3 (three) times daily as needed for moderate pain.   Past Week at Unknown time  . medroxyPROGESTERone (PROVERA) 10 MG tablet Take 10 mg by mouth daily.   Past Month at Unknown time   Results for orders placed or performed during the hospital encounter of 10/29/14 (from the  past 48 hour(s))  Urinalysis, Routine w reflex microscopic     Status: Abnormal   Collection Time: 10/29/14 10:23 PM  Result Value Ref Range   Color, Urine YELLOW YELLOW   APPearance HAZY (A) CLEAR   Specific Gravity, Urine 1.015 1.005 - 1.030   pH 7.0 5.0 - 8.0   Glucose, UA 100 (A) NEGATIVE mg/dL   Hgb urine dipstick LARGE (A) NEGATIVE   Bilirubin Urine NEGATIVE NEGATIVE   Ketones, ur NEGATIVE NEGATIVE mg/dL   Protein, ur 30 (A) NEGATIVE mg/dL   Urobilinogen, UA 1.0 0.0 - 1.0 mg/dL   Nitrite POSITIVE (A) NEGATIVE   Leukocytes, UA MODERATE (A) NEGATIVE  Urine microscopic-add on     Status: Abnormal   Collection Time: 10/29/14 10:23 PM  Result Value Ref Range   Squamous Epithelial / LPF FEW (A) RARE   WBC, UA 7-10 <3 WBC/hpf   RBC / HPF 11-20 <3 RBC/hpf   Bacteria, UA MANY (A) RARE   Urine-Other AMORPHOUS  URATES/PHOSPHATES   hCG, quantitative, pregnancy     Status: Abnormal   Collection Time: 10/29/14 10:25 PM  Result Value Ref Range   hCG, Beta Chain, Quant, S 449 (H) <5 mIU/mL    Comment:          GEST. AGE      CONC.  (mIU/mL)   <=1 WEEK        5 - 50     2 WEEKS       50 - 500     3 WEEKS       100 - 10,000     4 WEEKS     1,000 - 30,000     5 WEEKS     3,500 - 115,000   6-8 WEEKS     12,000 - 270,000    12 WEEKS     15,000 - 220,000        FEMALE AND NON-PREGNANT FEMALE:     LESS THAN 5 mIU/mL   CBC     Status: Abnormal   Collection Time: 10/29/14 10:44 PM  Result Value Ref Range   WBC 12.9 (H) 4.0 - 10.5 K/uL   RBC 4.01 3.87 - 5.11 MIL/uL   Hemoglobin 12.3 12.0 - 15.0 g/dL   HCT 16.1 09.6 - 04.5 %   MCV 91.8 78.0 - 100.0 fL   MCH 30.7 26.0 - 34.0 pg   MCHC 33.4 30.0 - 36.0 g/dL   RDW 40.9 81.1 - 91.4 %   Platelets 332 150 - 400 K/uL    US Ob Comp Less 14 Wks  10/27/2014   CLINICAL DATA:  Spotting last night and today. Bleeding heavier today. Mild cramping. Positive urine pregnancy test. Estimated gestational age by LMP is 5 weeks 4 days.  EXAM: OBSTETRIC <14 WK [place] AND TRANSVAGINAL OB US  TECHNIQUE: Both transabdominal and transvaginal ultrasound examinations were performed for complete evaluation of the gestation as well as the maternal uterus, adnexal regions, and pelvic cul-de-sac. Transvaginal technique was performed to assess early pregnancy.  COMPARISON:  None.  FINDINGS: Intrauterine gestational sac: There is a tiny intrauterine fluid collection which may represent an early intrauterine gestational sac. This is equivocal. No definite decidual reaction is noted.  Yolk sac:  Not identified  Embryo:  Not identified  Cardiac Activity: Not identified  MSD: 3.5  mm   4 w   6  d                [place]  EDC: 06/30/2015  Maternal uterus/adnexae: Uterus is anteverted. No myometrial masses. Cervix is normal. Both ovaries are identified. Complex cystic structure in the right ovary with  surrounding flow on color flow Doppler imaging likely represents a corpus luteal cyst. No free fluid.  IMPRESSION: Possible early intrauterine gestational sac, but no yolk sac, fetal pole, or cardiac activity yet visualized. Recommend follow-up quantitative B-HCG levels and follow-up US in 14 days to confirm and assess viability. This recommendation follows SRU consensus guidelines: Diagnostic Criteria for Nonviable Pregnancy Early in the First Trimester. Malva Limes Engl J Med 2013; 454:0981-19; 369:1443-51.   Electronically Signed   By: Burman NievesWilliam  Stevens M.D.   On: 10/27/2014 20:07   [place] Ob Transvaginal  10/30/2014   CLINICAL DATA:  Worsening pain and vaginal bleeding since Wednesday. Estimated gestational age by LMP is 6 weeks 0 days. Quantitative beta HCG 3 days ago was 401 and today is 449.  EXAM: TRANSVAGINAL OB ULTRASOUND  TECHNIQUE: Transvaginal ultrasound was performed for complete evaluation of the gestation as well as the maternal uterus, adnexal regions, and pelvic cul-de-sac.  COMPARISON:  10/27/2014  FINDINGS: Intrauterine gestational sac: No intrauterine gestational sac identified.  Yolk sac:  Not identified  Embryo:  Not identified  Cardiac Activity: Not identified  Maternal uterus/adnexae: Uterine myometrium appears homogeneous. Endometrial stripe thickness is normal. Endometrium is uniform. The questionable gestational sac seen previously is not identified today. Small nabothian cysts in the cervix. Left ovary is visualized and appears normal. Left ovary measures 2.8 x 1.6 x 1.6 cm. Right ovary is visualized. Right ovary measures 3.2 x 2.9 x 2.6 cm. There is a focal complex cyst with surrounding flow on color flow Doppler imaging likely representing a corpus luteum cyst. In addition, there is a mass with central cystic change measuring 1.8 cm diameter, located lateral to the right ovary, and with limited central and peripheral flow. No ectopic fetal pole or yolk sac is identified. Appearance is worrisome for ectopic  pregnancy. No free pelvic fluid collections.  IMPRESSION: No intrauterine pregnancy demonstrated. Mass adjacent to the right ovary with central cystic change suggests ectopic pregnancy. Additional corpus luteum cyst on the right. No free pelvic fluid.  These results were called by telephone at the time of interpretation on 10/30/2014 at 12:33 am to Dr. Abundio MiuHorowitz , who verbally acknowledged these results.   Electronically Signed   By: Burman NievesWilliam  Stevens M.D.   On: 10/30/2014 00:38   [place] Ob Transvaginal  10/27/2014   CLINICAL DATA:  Spotting last night and today. Bleeding heavier today. Mild cramping. Positive urine pregnancy test. Estimated gestational age by LMP is 5 weeks 4 days.  EXAM: OBSTETRIC <14 WK US AND TRANSVAGINAL OB US  TECHNIQUE: Both transabdominal and transvaginal ultrasound examinations were performed for complete evaluation of the gestation as well as the maternal uterus, adnexal regions, and pelvic cul-de-sac. Transvaginal technique was performed to assess early pregnancy.  COMPARISON:  None.  FINDINGS: Intrauterine gestational sac: There is a tiny intrauterine fluid collection which may represent an early intrauterine gestational sac. This is equivocal. No definite decidual reaction is noted.  Yolk sac:  Not identified  Embryo:  Not identified  Cardiac Activity: Not identified  MSD: 3.5  mm   4 w   6  d                US EDC: 06/30/2015  Maternal uterus/adnexae: Uterus is anteverted. No myometrial masses. Cervix is normal. Both ovaries are identified. Complex cystic structure in the right ovary with  surrounding flow on color flow Doppler imaging likely represents a corpus luteal cyst. No free fluid.  IMPRESSION: Possible early intrauterine gestational sac, but no yolk sac, fetal pole, or cardiac activity yet visualized. Recommend follow-up quantitative B-HCG levels and follow-up US in 14 days to confirm and assess viability. This recommendation follows SRU consensus guidelines: Diagnostic Criteria  for Nonviable Pregnancy Early in the First Trimester. Malva Limes Med 2013; 161:0960-45.   Electronically Signed   By: Burman Nieves M.D.   On: 10/27/2014 20:07    Review of Systems  Constitutional: Negative for fever and chills.  Gastrointestinal: Positive for nausea and abdominal pain.  Genitourinary: Negative for dysuria, urgency and frequency.   Physical Exam   Blood pressure 146/74, pulse 110, temperature 98.2 F (36.8 C), resp. rate 18, height 5' (1.524 m), weight 81.92 kg (180 lb 9.6 oz), last menstrual period 09/18/2014, SpO2 100 %.  Physical Exam  Constitutional: She is oriented to person, place, and time. She appears well-developed and well-nourished. No distress.  Patient is tearful   HENT:  Head: Normocephalic.  Eyes: Pupils are equal, round, and reactive to light.  Cardiovascular: Normal rate and normal heart sounds.   Respiratory: Effort normal and breath sounds normal. No respiratory distress. She has no wheezes.  GI: Normal appearance. There is tenderness in the right lower quadrant and suprapubic area. There is rigidity. There is no rebound, no guarding and no CVA tenderness.  Musculoskeletal: Normal range of motion.  Neurological: She is alert and oriented to person, place, and time.  Skin: Skin is warm. She is not diaphoretic.  Psychiatric: Her behavior is normal.    MAU Course  Procedures  None  MDM  100% on RA UA Urine culture.   Discussed patient with Dr. Henderson Cloud 1205: patient in [place]  Dr .Henderson Cloud in MAU.   Assessment and Plan   A:  Right ovarian mass; ectopic pregnancy  P:  Admit to 3rd floor per Dr. Henderson Cloud.  MTX treatment    Iona Hansen Rasch, NP 10/30/2014 1:01 AM

## 2014-10-29 NOTE — MAU Note (Signed)
Was here Weds with mild cramping and vag bleeding. Was to return tomorrow for repeat labs. Bleeding today irreg but more tonight. SOB today and pain R side that goes to back.

## 2014-10-30 ENCOUNTER — Inpatient Hospital Stay (HOSPITAL_COMMUNITY): Payer: BLUE CROSS/BLUE SHIELD

## 2014-10-30 ENCOUNTER — Encounter (HOSPITAL_COMMUNITY): Payer: Self-pay | Admitting: *Deleted

## 2014-10-30 DIAGNOSIS — O009 Unspecified ectopic pregnancy without intrauterine pregnancy: Secondary | ICD-10-CM | POA: Diagnosis present

## 2014-10-30 LAB — DIFFERENTIAL
Basophils Absolute: 0 10*3/uL (ref 0.0–0.1)
Basophils Relative: 0 % (ref 0–1)
Eosinophils Absolute: 0.2 10*3/uL (ref 0.0–0.7)
Eosinophils Relative: 2 % (ref 0–5)
Lymphocytes Relative: 26 % (ref 12–46)
Lymphs Abs: 3.3 10*3/uL (ref 0.7–4.0)
Monocytes Absolute: 1 10*3/uL (ref 0.1–1.0)
Monocytes Relative: 8 % (ref 3–12)
Neutro Abs: 8.3 10*3/uL — ABNORMAL HIGH (ref 1.7–7.7)
Neutrophils Relative %: 64 % (ref 43–77)

## 2014-10-30 LAB — CBC WITH DIFFERENTIAL/PLATELET

## 2014-10-30 LAB — AST: AST: 22 U/L (ref 0–37)

## 2014-10-30 LAB — CREATININE, SERUM
Creatinine, Ser: 0.54 mg/dL (ref 0.50–1.10)
GFR calc Af Amer: >90 mL/min (ref >90)
GFR calc non Af Amer: >90 mL/min (ref >90)

## 2014-10-30 LAB — BUN: BUN: 7 mg/dL (ref 6–23)

## 2014-10-30 MED ORDER — PHENAZOPYRIDINE HCL 100 MG PO TABS
200.0000 mg | ORAL_TABLET | Freq: Three times a day (TID) | ORAL | Status: DC
Start: 2014-10-30 — End: 2014-10-30
  Administered 2014-10-30: 200 mg via ORAL
  Filled 2014-10-30: qty 2

## 2014-10-30 MED ORDER — CEPHALEXIN 500 MG PO CAPS
500.0000 mg | ORAL_CAPSULE | Freq: Four times a day (QID) | ORAL | Status: DC
  Administered 2014-10-30: 500 mg via ORAL
  Filled 2014-10-30 (×2): qty 1

## 2014-10-30 MED ORDER — KETOROLAC TROMETHAMINE 30 MG/ML IJ SOLN
30.0000 mg | Freq: Four times a day (QID) | INTRAMUSCULAR | Status: DC
  Filled 2014-10-30: qty 1

## 2014-10-30 MED ORDER — METHOTREXATE INJECTION FOR WOMEN'S HOSPITAL
50.0000 mg/m2 | Freq: Once | INTRAMUSCULAR | Status: AC
  Administered 2014-10-30: 95 mg via INTRAMUSCULAR
  Filled 2014-10-30: qty 1.9

## 2014-10-30 MED ORDER — OXYCODONE-ACETAMINOPHEN 5-325 MG PO TABS: 1.0000 | ORAL_TABLET | ORAL | Status: DC | PRN

## 2014-10-30 MED ORDER — ALPRAZOLAM 0.25 MG PO TABS: 0.5000 mg | ORAL_TABLET | Freq: Every day | ORAL | Status: DC

## 2014-10-30 MED ORDER — ONDANSETRON HCL 4 MG/2ML IJ SOLN: 4.0000 mg | Freq: Four times a day (QID) | INTRAMUSCULAR | Status: DC | PRN

## 2014-10-30 MED ORDER — SODIUM CHLORIDE 0.9 % IV SOLN: 250.0000 mL | INTRAVENOUS | Status: DC | PRN

## 2014-10-30 MED ORDER — SODIUM CHLORIDE 0.9 % IJ SOLN: 3.0000 mL | INTRAMUSCULAR | Status: DC | PRN

## 2014-10-30 MED ORDER — SODIUM CHLORIDE 0.9 % IJ SOLN
3.0000 mL | Freq: Two times a day (BID) | INTRAMUSCULAR | Status: DC
  Administered 2014-10-30: 3 mL via INTRAVENOUS

## 2014-10-30 MED ORDER — HYDROXYCHLOROQUINE SULFATE 200 MG PO TABS
200.0000 mg | ORAL_TABLET | Freq: Two times a day (BID) | ORAL | Status: DC
  Administered 2014-10-30: 200 mg via ORAL
  Filled 2014-10-30: qty 1

## 2014-10-30 MED ORDER — PRENATAL MULTIVITAMIN CH
1.0000 | ORAL_TABLET | Freq: Every day | ORAL | Status: DC
Start: 2014-10-30 — End: 2014-10-30

## 2014-10-30 MED ORDER — TRAMADOL HCL 50 MG PO TABS: 50.0000 mg | ORAL_TABLET | Freq: Three times a day (TID) | ORAL | Status: DC | PRN

## 2014-10-30 MED ORDER — CYCLOBENZAPRINE HCL 5 MG PO TABS: 5.0000 mg | ORAL_TABLET | Freq: Every day | ORAL | Status: DC

## 2014-10-30 MED ORDER — PNEUMOCOCCAL VAC POLYVALENT 25 MCG/0.5ML IJ INJ: 0.5000 mL | INJECTION | INTRAMUSCULAR | Status: DC

## 2014-10-30 MED ORDER — ONDANSETRON HCL 4 MG PO TABS: 4.0000 mg | ORAL_TABLET | Freq: Four times a day (QID) | ORAL | Status: DC | PRN

## 2014-10-30 MED ORDER — KETOROLAC TROMETHAMINE 30 MG/ML IJ SOLN: 30.0000 mg | Freq: Four times a day (QID) | INTRAMUSCULAR | Status: DC

## 2014-10-30 MED ORDER — PRENATAL MULTIVITAMIN CH: 1.0000 | ORAL_TABLET | Freq: Every day | ORAL | Status: DC

## 2014-10-30 MED ORDER — SIMETHICONE 80 MG PO CHEW: 80.0000 mg | CHEWABLE_TABLET | Freq: Four times a day (QID) | ORAL | Status: DC | PRN

## 2014-10-30 MED ORDER — ALUM & MAG HYDROXIDE-SIMETH 200-200-20 MG/5ML PO SUSP: 30.0000 mL | ORAL | Status: DC | PRN

## 2014-10-30 NOTE — Progress Notes (Signed)
Discharge teaching complete. Pt understood all instructions and did not have any questions. Pt ambulated out of the hospital and discharged home to family.  

## 2014-10-30 NOTE — Progress Notes (Signed)
Utilization Review completed.  

## 2014-10-30 NOTE — Progress Notes (Signed)
Pt to 305 via w/c. Update called to Liborio NixonJanice RN on Women's Unit.

## 2014-10-30 NOTE — Progress Notes (Signed)
Patient is eating, ambulating, and voiding.  Pain control is good.  BP 104/39 mmHg  Pulse 94  Temp(Src) 98.4 F (36.9 C) (Oral)  Resp 16  Ht 5' (1.524 m)  Wt 81.92 kg (180 lb 9.6 oz)  BMI 35.27 kg/m2  SpO2 98%  LMP 09/18/2014 (Approximate)  lungs:   clear to auscultation cor:    RRR Abdomen:  soft, no rebound or guarding. ex:    no cords   Results for orders placed or performed during the hospital encounter of 10/29/14 (from the past 24 hour(s))  Urinalysis, Routine w reflex microscopic     Status: Abnormal   Collection Time: 10/29/14 10:23 PM  Result Value Ref Range   Color, Urine YELLOW YELLOW   APPearance HAZY (A) CLEAR   Specific Gravity, Urine 1.015 1.005 - 1.030   pH 7.0 5.0 - 8.0   Glucose, UA 100 (A) NEGATIVE mg/dL   Hgb urine dipstick LARGE (A) NEGATIVE   Bilirubin Urine NEGATIVE NEGATIVE   Ketones, ur NEGATIVE NEGATIVE mg/dL   Protein, ur 30 (A) NEGATIVE mg/dL   Urobilinogen, UA 1.0 0.0 - 1.0 mg/dL   Nitrite POSITIVE (A) NEGATIVE   Leukocytes, UA MODERATE (A) NEGATIVE  Urine microscopic-add on     Status: Abnormal   Collection Time: 10/29/14 10:23 PM  Result Value Ref Range   Squamous Epithelial / LPF FEW (A) RARE   WBC, UA 7-10 <3 WBC/hpf   RBC / HPF 11-20 <3 RBC/hpf   Bacteria, UA MANY (A) RARE   Urine-Other AMORPHOUS URATES/PHOSPHATES   hCG, quantitative, pregnancy     Status: Abnormal   Collection Time: 10/29/14 10:25 PM  Result Value Ref Range   hCG, Beta Chain, Quant, S 449 (H) <5 mIU/mL  CBC WITH DIFFERENTIAL     Status: None (Preliminary result)   Collection Time: 10/29/14 10:25 PM  Result Value Ref Range   WBC  4.0 - 10.5 K/uL    DUPLICATE REQUEST  SEE [id number] FROM 2244 ON 10/30/2014   RBC  3.87 - 5.11 MIL/uL    DUPLICATE REQUEST  SEE [id number] FROM 2244 ON 10/30/2014   Hemoglobin  12.0 - 15.0 g/dL    DUPLICATE REQUEST  SEE [id number] FROM 2244 ON 10/30/2014   HCT  36.0 - 46.0 %    DUPLICATE REQUEST  SEE [id number] FROM 2244 ON 10/30/2014   MCV  78.0 -  100.0 fL    DUPLICATE REQUEST  SEE [id number] FROM 2244 ON 10/30/2014   MCH  26.0 - 34.0 pg    DUPLICATE REQUEST  SEE [id number] FROM 2244 ON 10/30/2014   MCHC  30.0 - 36.0 g/dL    DUPLICATE REQUEST  SEE [id number] FROM 2244 ON 10/30/2014   RDW  11.5 - 15.5 %    DUPLICATE REQUEST  SEE [id number] FROM 2244 ON 10/30/2014   Platelets  150 - 400 K/uL    DUPLICATE REQUEST  SEE [id number] FROM 2244 ON 10/30/2014   Neutrophils Relative % PENDING 43 - 77 %   Neutro Abs PENDING 1.7 - 7.7 K/uL   Band Neutrophils PENDING 0 - 10 %   Lymphocytes Relative PENDING 12 - 46 %   Lymphs Abs PENDING 0.7 - 4.0 K/uL   Monocytes Relative PENDING 3 - 12 %   Monocytes Absolute PENDING 0.1 - 1.0 K/uL   Eosinophils Relative PENDING 0 - 5 %   Eosinophils Absolute PENDING 0.0 - 0.7 K/uL   Basophils Relative PENDING 0 - 1 %   Basophils Absolute PENDING  0.0 - 0.1 K/uL   LUCs, % PENDING 0 - 4 %   LUC, Absolute PENDING 0.0 - 0.5 K/uL   WBC Morphology PENDING    RBC Morphology PENDING    Smear Review PENDING    Other PENDING %   Other 2 PENDING %   nRBC PENDING 0 /100 WBC   Metamyelocytes Relative PENDING %   Myelocytes PENDING %   Promyelocytes Absolute PENDING %   Blasts PENDING %  BUN     Status: None   Collection Time: 10/29/14 10:25 PM  Result Value Ref Range   BUN 7 6 - 23 mg/dL  AST     Status: None   Collection Time: 10/29/14 10:25 PM  Result Value Ref Range   AST 22 0 - 37 U/L  Creatinine, serum     Status: None   Collection Time: 10/29/14 10:25 PM  Result Value Ref Range   Creatinine, Ser 0.54 0.50 - 1.10 mg/dL   GFR calc non Af Amer >90 >90 mL/min   GFR calc Af Amer >90 >90 mL/min  CBC     Status: Abnormal   Collection Time: 10/29/14 10:44 PM  Result Value Ref Range   WBC 12.9 (H) 4.0 - 10.5 K/uL   RBC 4.01 3.87 - 5.11 MIL/uL   Hemoglobin 12.3 12.0 - 15.0 g/dL   HCT 16.136.8 09.636.0 - 04.546.0 %   MCV 91.8 78.0 - 100.0 fL   MCH 30.7 26.0 - 34.0 pg   MCHC 33.4 30.0 - 36.0 g/dL   RDW 40.913.1 81.111.5 - 91.415.5 %   Platelets 332  150 - 400 K/uL  Differential     Status: Abnormal   Collection Time: 10/29/14 10:55 PM  Result Value Ref Range   Neutrophils Relative % 64 43 - 77 %   Neutro Abs 8.3 (H) 1.7 - 7.7 K/uL   Lymphocytes Relative 26 12 - 46 %   Lymphs Abs 3.3 0.7 - 4.0 K/uL   Monocytes Relative 8 3 - 12 %   Monocytes Absolute 1.0 0.1 - 1.0 K/uL   Eosinophils Relative 2 0 - 5 %   Eosinophils Absolute 0.2 0.0 - 0.7 K/uL   Basophils Relative 0 0 - 1 %   Basophils Absolute 0.0 0.0 - 0.1 K/uL  CBC WITH DIFFERENTIAL     Status: None (Preliminary result)   Collection Time: 10/30/14  1:10 AM  Result Value Ref Range   WBC DUPLICATE  SEE [id number] PER RN JOP K/uL   RBC DUPLICATE  SEE [id number] PER RN JOP MIL/uL   Hemoglobin DUPLICATE  SEE [id number] PER RN JOP g/dL   HCT DUPLICATE  SEE [id number] PER RN JOP %   MCV DUPLICATE  SEE [id number] PER RN JOP fL   MCH DUPLICATE  SEE [id number] PER RN JOP pg   MCHC DUPLICATE  SEE [id number] PER RN JOP g/dL   RDW DUPLICATE  SEE [id number] PER RN JOP %   Platelets DUPLICATE  SEE [id number] PER RN JOP K/uL   Neutrophils Relative % PENDING %   Neutro Abs PENDING K/uL   Band Neutrophils PENDING %   Lymphocytes Relative PENDING %   Lymphs Abs PENDING K/uL   Monocytes Relative PENDING %   Monocytes Absolute PENDING K/uL   Eosinophils Relative PENDING %   Eosinophils Absolute PENDING K/uL   Basophils Relative PENDING %   Basophils Absolute PENDING K/uL   LUCs, % PENDING %   LUC, Absolute PENDING K/uL   WBC Morphology PENDING  RBC Morphology PENDING    Smear Review PENDING    Other PENDING %   Other 2 PENDING %   nRBC PENDING /100 WBC   Metamyelocytes Relative PENDING %   Myelocytes PENDING %   Promyelocytes Absolute PENDING %   Blasts PENDING %    A/P  Routine care.  Expect d/c with instructions for f/u for ectopic and MTX.

## 2014-10-30 NOTE — Discharge Summary (Signed)
Physician Discharge Summary  Patient ID: Jamie Waters MRN: [id number] DOB/AGE: September 06, 1990 24 y.o.  Admit date: 10/29/2014 Discharge date: 10/30/2014  Admission Diagnoses:ectopic pregnancy  Discharge Diagnoses: same s/p methotrexate Active Problems:   Ectopic pregnancy   Discharged Condition: good  Hospital Course: Uncompicated administration of MTX and obs ON for pain.  Consults: None  Significant Diagnostic Studies: labs:  Results for orders placed or performed during the hospital encounter of 10/29/14 (from the past 24 hour(s))  Urinalysis, Routine w reflex microscopic     Status: Abnormal   Collection Time: 10/29/14 10:23 PM  Result Value Ref Range   Color, Urine YELLOW YELLOW   APPearance HAZY (A) CLEAR   Specific Gravity, Urine 1.015 1.005 - 1.030   pH 7.0 5.0 - 8.0   Glucose, UA 100 (A) NEGATIVE mg/dL   Hgb urine dipstick LARGE (A) NEGATIVE   Bilirubin Urine NEGATIVE NEGATIVE   Ketones, ur NEGATIVE NEGATIVE mg/dL   Protein, ur 30 (A) NEGATIVE mg/dL   Urobilinogen, UA 1.0 0.0 - 1.0 mg/dL   Nitrite POSITIVE (A) NEGATIVE   Leukocytes, UA MODERATE (A) NEGATIVE  Urine microscopic-add on     Status: Abnormal   Collection Time: 10/29/14 10:23 PM  Result Value Ref Range   Squamous Epithelial / LPF FEW (A) RARE   WBC, UA 7-10 <3 WBC/hpf   RBC / HPF 11-20 <3 RBC/hpf   Bacteria, UA MANY (A) RARE   Urine-Other AMORPHOUS URATES/PHOSPHATES   hCG, quantitative, pregnancy     Status: Abnormal   Collection Time: 10/29/14 10:25 PM  Result Value Ref Range   hCG, Beta Chain, Quant, S 449 (H) <5 mIU/mL  CBC WITH DIFFERENTIAL     Status: None (Preliminary result)   Collection Time: 10/29/14 10:25 PM  Result Value Ref Range   WBC  4.0 - 10.5 K/uL    DUPLICATE REQUEST  SEE [id number] FROM 2244 ON 10/30/2014   RBC  3.87 - 5.11 MIL/uL    DUPLICATE REQUEST  SEE [id number] FROM 2244 ON 10/30/2014   Hemoglobin  12.0 - 15.0 g/dL    DUPLICATE REQUEST  SEE [id number] FROM 2244 ON 10/30/2014   HCT   36.0 - 46.0 %    DUPLICATE REQUEST  SEE [id number] FROM 2244 ON 10/30/2014   MCV  78.0 - 100.0 fL    DUPLICATE REQUEST  SEE [id number] FROM 2244 ON 10/30/2014   MCH  26.0 - 34.0 pg    DUPLICATE REQUEST  SEE [id number] FROM 2244 ON 10/30/2014   MCHC  30.0 - 36.0 g/dL    DUPLICATE REQUEST  SEE [id number] FROM 2244 ON 10/30/2014   RDW  11.5 - 15.5 %    DUPLICATE REQUEST  SEE [id number] FROM 2244 ON 10/30/2014   Platelets  150 - 400 K/uL    DUPLICATE REQUEST  SEE [id number] FROM 2244 ON 10/30/2014   Neutrophils Relative % PENDING 43 - 77 %   Neutro Abs PENDING 1.7 - 7.7 K/uL   Band Neutrophils PENDING 0 - 10 %   Lymphocytes Relative PENDING 12 - 46 %   Lymphs Abs PENDING 0.7 - 4.0 K/uL   Monocytes Relative PENDING 3 - 12 %   Monocytes Absolute PENDING 0.1 - 1.0 K/uL   Eosinophils Relative PENDING 0 - 5 %   Eosinophils Absolute PENDING 0.0 - 0.7 K/uL   Basophils Relative PENDING 0 - 1 %   Basophils Absolute PENDING 0.0 - 0.1 K/uL   LUCs, % PENDING 0 - 4 %  LUC, Absolute PENDING 0.0 - 0.5 K/uL   WBC Morphology PENDING    RBC Morphology PENDING    Smear Review PENDING    Other PENDING %   Other 2 PENDING %   nRBC PENDING 0 /100 WBC   Metamyelocytes Relative PENDING %   Myelocytes PENDING %   Promyelocytes Absolute PENDING %   Blasts PENDING %  BUN     Status: None   Collection Time: 10/29/14 10:25 PM  Result Value Ref Range   BUN 7 6 - 23 mg/dL  AST     Status: None   Collection Time: 10/29/14 10:25 PM  Result Value Ref Range   AST 22 0 - 37 U/L  Creatinine, serum     Status: None   Collection Time: 10/29/14 10:25 PM  Result Value Ref Range   Creatinine, Ser 0.54 0.50 - 1.10 mg/dL   GFR calc non Af Amer >90 >90 mL/min   GFR calc Af Amer >90 >90 mL/min  CBC     Status: Abnormal   Collection Time: 10/29/14 10:44 PM  Result Value Ref Range   WBC 12.9 (H) 4.0 - 10.5 K/uL   RBC 4.01 3.87 - 5.11 MIL/uL   Hemoglobin 12.3 12.0 - 15.0 g/dL   HCT 30.8 65.7 - 84.6 %   MCV 91.8 78.0 - 100.0 fL   MCH 30.7  26.0 - 34.0 pg   MCHC 33.4 30.0 - 36.0 g/dL   RDW 96.2 95.2 - 84.1 %   Platelets 332 150 - 400 K/uL  Differential     Status: Abnormal   Collection Time: 10/29/14 10:55 PM  Result Value Ref Range   Neutrophils Relative % 64 43 - 77 %   Neutro Abs 8.3 (H) 1.7 - 7.7 K/uL   Lymphocytes Relative 26 12 - 46 %   Lymphs Abs 3.3 0.7 - 4.0 K/uL   Monocytes Relative 8 3 - 12 %   Monocytes Absolute 1.0 0.1 - 1.0 K/uL   Eosinophils Relative 2 0 - 5 %   Eosinophils Absolute 0.2 0.0 - 0.7 K/uL   Basophils Relative 0 0 - 1 %   Basophils Absolute 0.0 0.0 - 0.1 K/uL  CBC WITH DIFFERENTIAL     Status: None (Preliminary result)   Collection Time: 10/30/14  1:10 AM  Result Value Ref Range   WBC DUPLICATE  SEE [id number] PER RN JOP K/uL   RBC DUPLICATE  SEE [id number] PER RN JOP MIL/uL   Hemoglobin DUPLICATE  SEE [id number] PER RN JOP g/dL   HCT DUPLICATE  SEE [id number] PER RN JOP %   MCV DUPLICATE  SEE [id number] PER RN JOP fL   MCH DUPLICATE  SEE [id number] PER RN JOP pg   MCHC DUPLICATE  SEE [id number] PER RN JOP g/dL   RDW DUPLICATE  SEE [id number] PER RN JOP %   Platelets DUPLICATE  SEE [id number] PER RN JOP K/uL   Neutrophils Relative % PENDING %   Neutro Abs PENDING K/uL   Band Neutrophils PENDING %   Lymphocytes Relative PENDING %   Lymphs Abs PENDING K/uL   Monocytes Relative PENDING %   Monocytes Absolute PENDING K/uL   Eosinophils Relative PENDING %   Eosinophils Absolute PENDING K/uL   Basophils Relative PENDING %   Basophils Absolute PENDING K/uL   LUCs, % PENDING %   LUC, Absolute PENDING K/uL   WBC Morphology PENDING    RBC Morphology PENDING    Smear Review PENDING    Other  PENDING %   Other 2 PENDING %   nRBC PENDING /100 WBC   Metamyelocytes Relative PENDING %   Myelocytes PENDING %   Promyelocytes Absolute PENDING %   Blasts PENDING %    Treatments: MTX  Discharge Exam: Blood pressure 104/39, pulse 94, temperature 98.4 F (36.9 C), temperature source Oral, resp. rate 16, height 5' (1.524 m),  weight 81.92 kg (180 lb 9.6 oz), last menstrual period 09/18/2014, SpO2 98 %.   Disposition: 01-Home or Self Care  Discharge Instructions    Call MD for:  temperature >100.4    Complete by:  As directed      Diet - low sodium heart healthy    Complete by:  As directed      Discharge instructions    Complete by:  As directed   No driving on narcotics, no sexual activity for 2 weeks.     Increase activity slowly    Complete by:  As directed      May shower / Bathe    Complete by:  As directed   Shower, no bath for 2 weeks.     Remove dressing in 24 hours    Complete by:  As directed      Sexual Activity Restrictions    Complete by:  As directed   No sexual activity for 2 weeks.            Medication List    TAKE these medications        ALPRAZolam 0.5 MG tablet  Commonly known as:  XANAX  Take 0.5 mg by mouth at bedtime.     cephALEXin 500 MG capsule  Commonly known as:  KEFLEX  Take 1 capsule (500 mg total) by mouth 4 (four) times daily.     cyclobenzaprine 10 MG tablet  Commonly known as:  FLEXERIL  Take 5 mg by mouth at bedtime.     hydroxychloroquine 200 MG tablet  Commonly known as:  PLAQUENIL  Take 200 mg by mouth 2 (two) times daily.     medroxyPROGESTERone 10 MG tablet  Commonly known as:  PROVERA  Take 10 mg by mouth daily.     oxyCODONE-acetaminophen 5-325 MG per tablet  Commonly known as:  PERCOCET/ROXICET  Take 1-2 tablets by mouth every 3 (three) hours as needed for severe pain (moderate to severe pain (when tolerating fluids)).     phenazopyridine 200 MG tablet  Commonly known as:  PYRIDIUM  Take 1 tablet (200 mg total) by mouth 3 (three) times daily.     prenatal multivitamin Tabs tablet  Take 1 tablet by mouth daily at 12 noon.     traMADol 50 MG tablet  Commonly known as:  ULTRAM  Take 50 mg by mouth 3 (three) times daily as needed for moderate pain.           Follow-up Information    Follow up with mau In 4 days.   Contact  information:   need to return to [facility]U for blood work in 4 days.        Signed: Tomaz Janis A 10/30/2014, 8:54 AM

## 2014-10-30 NOTE — H&P (Signed)
Chief Complaint  Patient presents with  . Shortness of Breath  . Abdominal Cramping  . Vaginal Bleeding  . Back Pain   Please see NP note below for full details:   Briefly this is a 24 y.o. G1P0 [redacted]w[redacted]d who had presented 2 days ago with vaginal bleeding and abdominal pain.  HCG at that time was 400 and only a potential GS was seen in uterus.  Today she returns with worsening pain.  HCG today is still 400.  Pt sent for another [place] and now no sac in endometrium and possible small cystic structure in R adnexa suspicious for ectopic.  He reported no free fluid or blood in the pelvis.  Pt received Rhogam a few days ago.  She was also treated for a UTI then as well.  Filed Vitals:   10/29/14 2217  BP: 146/74  Pulse: 110  Temp: 98.2 F (36.8 C)  Resp: 18  Height: 5' (1.524 m)  Weight: 81.92 kg (180 lb 9.6 oz)  SpO2: 100%    Lungs CTA Cor RRR Abd soft, no rebound or guarding. Pelvic deferred  A/p  Probable early ectopic.  R/B/Alt of methotrexate d/w pt and she agrees.  Will observe over night to make sure pain does not substantially worsen.   HPI   Ms. KALEISHA BHARGAVA is a 24 y.o. female G1P0 at [redacted]w[redacted]d who presents with worsening abdominal pain.  Was seen 2 days ago with abdominal pain and vaginal bleeding She presents with pain that has worsened in her right side; it radiates to her back. The pain worsened earlier today. She describes the pain as "come and go." She currently rates her pain 8/10; pain is worse in her lower back .  Her bleeding is light; she notices it only when she wipes.  She also complains of shortness of breath that she has had all day. She feels like she cannot catch her breath. She has a history of anxiety and anxiety attacks. She feels the SOB is related to anxiety because she is very worried about   the pregnancy.   OB History    Gravida Para Term Preterm AB TAB SAB Ectopic Multiple Living   1         0      Past  Medical History  Diagnosis Date  . Asthma   . Raynauds syndrome   . Raynauds syndrome 2015  . Fibromyalgia   . Arthritis   . PCOS (polycystic ovarian syndrome) 2015  . Raynaud disease     Past Surgical History  Procedure Laterality Date  . Cyst removed from throat    . Thyroidectomy, partial  2010    Family History  Problem Relation Age of Onset  . Lupus Maternal Grandmother   . Cancer Maternal Grandmother   . Cancer Paternal Grandfather     History  Substance Use Topics  . Smoking status: Never Smoker   . Smokeless tobacco: Not on file  . Alcohol Use: No    Allergies:  Allergies  Allergen Reactions  . Norvasc [Amlodipine Besylate] Shortness Of Breath and Other (See Comments)    Caused tightness in chest.    Prescriptions prior to admission  Medication Sig Dispense Refill Last Dose  . ALPRAZolam (XANAX) 0.5 MG tablet Take 0.5 mg by mouth at bedtime.   Past Week at Unknown time  . cephALEXin (KEFLEX) 500 MG capsule Take 1 capsule (500 mg total) by mouth 4 (four) times daily. 20 capsule 0 10/29/2014 at Unknown  time  . cyclobenzaprine (FLEXERIL) 10 MG tablet Take 5 mg by mouth at bedtime.   Past Week at Unknown time  . hydroxychloroquine (PLAQUENIL) 200 MG tablet Take 200 mg by mouth 2 (two) times daily.   Past Week at Unknown time  . phenazopyridine (PYRIDIUM) 200 MG tablet Take 1 tablet (200 mg total) by mouth 3 (three) times daily. 6 tablet 0 10/29/2014 at Unknown time  . Prenatal Vit-Fe Fumarate-FA (PRENATAL MULTIVITAMIN) TABS tablet Take 1 tablet by mouth daily at 12 noon.   10/29/2014 at Unknown time  . traMADol (ULTRAM) 50 MG tablet Take 50 mg by mouth 3 (three) times daily as needed for moderate pain.   Past Week at Unknown time  . medroxyPROGESTERone (PROVERA) 10 MG tablet Take 10 mg by mouth daily.   Past Month at Unknown time     Lab Results Last 48 Hours    Results for orders placed or performed during the hospital encounter of 10/29/14 (from the past 48 hour(s))  Urinalysis, Routine w reflex microscopic Status: Abnormal   Collection Time: 10/29/14 10:23 PM  Result Value Ref Range   Color, Urine YELLOW YELLOW   APPearance HAZY (A) CLEAR   Specific Gravity, Urine 1.015 1.005 - 1.030   pH 7.0 5.0 - 8.0   Glucose, UA 100 (A) NEGATIVE mg/dL   Hgb urine dipstick LARGE (A) NEGATIVE   Bilirubin Urine NEGATIVE NEGATIVE   Ketones, ur NEGATIVE NEGATIVE mg/dL   Protein, ur 30 (A) NEGATIVE mg/dL   Urobilinogen, UA 1.0 0.0 - 1.0 mg/dL   Nitrite POSITIVE (A) NEGATIVE   Leukocytes, UA MODERATE (A) NEGATIVE  Urine microscopic-add on Status: Abnormal   Collection Time: 10/29/14 10:23 PM  Result Value Ref Range   Squamous Epithelial / LPF FEW (A) RARE   WBC, UA 7-10 <3 WBC/hpf   RBC / HPF 11-20 <3 RBC/hpf   Bacteria, UA MANY (A) RARE   Urine-Other AMORPHOUS URATES/PHOSPHATES       Review of Systems  Constitutional: Negative for fever and chills.  Gastrointestinal: Positive for nausea and abdominal pain.  Genitourinary: Negative for dysuria, urgency and frequency.   Physical Exam   Blood pressure 146/74, pulse 110, temperature 98.2 F (36.8 C), resp. rate 18, height 5' (1.524 m), weight 81.92 kg (180 lb 9.6 oz), last menstrual period 09/18/2014, SpO2 100 %.  Physical Exam  MAU Course  Procedures  MDM UA Urine culture.   Discussed patient with Dr. Henderson CloudHorvath 1205: patient in [place]

## 2014-10-31 LAB — CULTURE, OB URINE
Colony Count: NO GROWTH
Culture: NO GROWTH
Special Requests: NORMAL

## 2014-11-02 ENCOUNTER — Inpatient Hospital Stay (HOSPITAL_COMMUNITY)
Admission: AD | Admit: 2014-11-02 | Discharge: 2014-11-02 | Disposition: A | Discharge status: Home / Self Care | Payer: BLUE CROSS/BLUE SHIELD | Source: Ambulatory Visit | Attending: Obstetrics and Gynecology | Admitting: Obstetrics and Gynecology

## 2014-11-02 DIAGNOSIS — O001 Tubal pregnancy: Secondary | ICD-10-CM | POA: Diagnosis not present

## 2014-11-02 DIAGNOSIS — O009 Unspecified ectopic pregnancy without intrauterine pregnancy: Secondary | ICD-10-CM

## 2014-11-02 LAB — COMPREHENSIVE METABOLIC PANEL
ALT: 77 U/L — ABNORMAL HIGH (ref 0–35)
AST: 36 U/L (ref 0–37)
Albumin: 4.1 g/dL (ref 3.5–5.2)
Alkaline Phosphatase: 48 U/L (ref 39–117)
Anion gap: 2 — ABNORMAL LOW (ref 5–15)
BUN: 7 mg/dL (ref 6–23)
CO2: 26 mmol/L (ref 19–32)
Calcium: 8.6 mg/dL (ref 8.4–10.5)
Chloride: 106 mmol/L (ref 96–112)
Creatinine, Ser: 0.42 mg/dL — ABNORMAL LOW (ref 0.50–1.10)
GFR calc Af Amer: >90 mL/min (ref >90)
GFR calc non Af Amer: >90 mL/min (ref >90)
Glucose, Bld: 93 mg/dL (ref 70–99)
Potassium: 4 mmol/L (ref 3.5–5.1)
Sodium: 134 mmol/L — ABNORMAL LOW (ref 135–145)
Total Bilirubin: 0.6 mg/dL (ref 0.3–1.2)
Total Protein: 7.2 g/dL (ref 6.0–8.3)

## 2014-11-02 LAB — CBC
HCT: 36.5 % (ref 36.0–46.0)
Hemoglobin: 11.9 g/dL — ABNORMAL LOW (ref 12.0–15.0)
MCH: 30.4 pg (ref 26.0–34.0)
MCHC: 32.6 g/dL (ref 30.0–36.0)
MCV: 93.1 fL (ref 78.0–100.0)
Platelets: 300 10*3/uL (ref 150–400)
RBC: 3.92 MIL/uL (ref 3.87–5.11)
RDW: 13.2 % (ref 11.5–15.5)
WBC: 6.7 10*3/uL (ref 4.0–10.5)

## 2014-11-02 LAB — HCG, QUANTITATIVE, PREGNANCY: hCG, Beta Chain, Quant, S: 121 m[IU]/mL — ABNORMAL HIGH (ref <5)

## 2014-11-02 NOTE — MAU Provider Note (Signed)
Results for orders placed or performed during the hospital encounter of 11/02/14 (from the past 24 hour(s))  hCG, quantitative, pregnancy     Status: Abnormal   Collection Time: 11/02/14  8:40 AM  Result Value Ref Range   hCG, Beta Chain, Quant, S 121 (H) <5 mIU/mL  CBC     Status: Abnormal   Collection Time: 11/02/14  8:41 AM  Result Value Ref Range   WBC 6.7 4.0 - 10.5 K/uL   RBC 3.92 3.87 - 5.11 MIL/uL   Hemoglobin 11.9 (L) 12.0 - 15.0 g/dL   HCT 13.236.5 44.036.0 - 10.246.0 %   MCV 93.1 78.0 - 100.0 fL   MCH 30.4 26.0 - 34.0 pg   MCHC 32.6 30.0 - 36.0 g/dL   RDW 72.513.2 36.611.5 - 44.015.5 %   Platelets 300 150 - 400 K/uL   Results for orders placed or performed during the hospital encounter of 11/02/14 (from the past 24 hour(s))  hCG, quantitative, pregnancy     Status: Abnormal   Collection Time: 11/02/14  8:40 AM  Result Value Ref Range   hCG, Beta Chain, Quant, S 121 (H) <5 mIU/mL  CBC     Status: Abnormal   Collection Time: 11/02/14  8:41 AM  Result Value Ref Range   WBC 6.7 4.0 - 10.5 K/uL   RBC 3.92 3.87 - 5.11 MIL/uL   Hemoglobin 11.9 (L) 12.0 - 15.0 g/dL   HCT 34.736.5 42.536.0 - 95.646.0 %   MCV 93.1 78.0 - 100.0 fL   MCH 30.4 26.0 - 34.0 pg   MCHC 32.6 30.0 - 36.0 g/dL   RDW 38.713.2 56.411.5 - 33.215.5 %   Platelets 300 150 - 400 K/uL  Comprehensive metabolic panel     Status: Abnormal (Preliminary result)   Collection Time: 11/02/14  8:41 AM  Result Value Ref Range   Sodium 134 (L) 135 - 145 mmol/L   Potassium PENDING 3.5 - 5.1 mmol/L   Chloride 106 96 - 112 mmol/L   CO2 26 19 - 32 mmol/L   Glucose, Bld 93 70 - 99 mg/dL   BUN 7 6 - 23 mg/dL   Creatinine, Ser 9.510.42 (L) 0.50 - 1.10 mg/dL   Calcium 8.6 8.4 - 88.410.5 mg/dL   Total Protein 7.2 6.0 - 8.3 g/dL   Albumin 4.1 3.5 - 5.2 g/dL   AST 36 0 - 37 U/L   ALT 77 (H) 0 - 35 U/L   Alkaline Phosphatase 48 39 - 117 U/L   Total Bilirubin 0.6 0.3 - 1.2 mg/dL   GFR calc non Af Amer >90 >90 mL/min   GFR calc Af Amer >90 >90 mL/min   Anion gap PENDING 5 -  15      S: 24 y.o. G1P0 @[redacted]w[redacted]d  with ectopic pregnancy presents for 4 day follow up after Methotrexate  O. BP 112/43 mmHg  Pulse 95  Temp(Src) 98.5 F (36.9 C) (Oral)  Resp 18  LMP 09/18/2014 (Approximate)  A:  Ectopic Pregnancy  P: Return to MAU on Friday for repeat Beta HCG and CMP- Spoke with Dr Henderson CloudHorvath

## 2014-11-02 NOTE — Discharge Instructions (Signed)
°Ectopic Pregnancy °An ectopic pregnancy is when the fertilized egg attaches (implants) outside the uterus. Most ectopic pregnancies occur in the fallopian tube. Rarely do ectopic pregnancies occur on the ovary, intestine, pelvis, or cervix. In an ectopic pregnancy, the fertilized egg does not have the ability to develop into a normal, healthy baby.  °A ruptured ectopic pregnancy is one in which the fallopian tube gets torn or bursts and results in internal bleeding. Often there is intense abdominal pain, and sometimes, vaginal bleeding. Having an ectopic pregnancy can be life threatening. If left untreated, this dangerous condition can lead to a blood transfusion, abdominal surgery, or even death. °CAUSES  °Damage to the fallopian tubes is the suspected cause in most ectopic pregnancies.  °RISK FACTORS °Depending on your circumstances, the risk of having an ectopic pregnancy will vary. The level of risk can be divided into three categories. °High Risk °· You have gone through infertility treatment. °· You have had a previous ectopic pregnancy. °· You have had previous tubal surgery. °· You have had previous surgery to have the fallopian tubes tied (tubal ligation). °· You have tubal problems or diseases. °· You have been exposed to DES. DES is a medicine that was used until 1971 and had effects on babies whose mothers took the medicine. °· You become pregnant while using an intrauterine device (IUD) for birth control.  °Moderate Risk °· You have a history of infertility. °· You have a history of a sexually transmitted infection (STI). °· You have a history of pelvic inflammatory disease (PID). °· You have scarring from endometriosis. °· You have multiple sexual partners. °· You smoke.  °Low Risk °· You have had previous pelvic surgery. °· You use vaginal douching. °· You became sexually active before 23 years of age. °SIGNS AND SYMPTOMS  °An ectopic pregnancy should be suspected in anyone who has missed a period  and has abdominal pain or bleeding. °· You may experience normal pregnancy symptoms, such as: °¨ Nausea. °¨ Tiredness. °¨ Breast tenderness. °· Other symptoms may include: °¨ Pain with intercourse. °¨ Irregular vaginal bleeding or spotting. °¨ Cramping or pain on one side or in the lower abdomen. °¨ Fast heartbeat. °¨ Passing out while having a bowel movement. °· Symptoms of a ruptured ectopic pregnancy and internal bleeding may include: °¨ Sudden, severe pain in the abdomen and pelvis. °¨ Dizziness or fainting. °¨ Pain in the shoulder area. °DIAGNOSIS  °Tests that may be performed include: °· A pregnancy test. °· An ultrasound test. °· Testing the specific level of pregnancy hormone in the bloodstream. °· Taking a sample of uterus tissue (dilation and curettage, D&C). °· Surgery to perform a visual exam of the inside of the abdomen using a thin, lighted tube with a tiny camera on the end (laparoscope). °TREATMENT  °An injection of a medicine called methotrexate may be given. This medicine causes the pregnancy tissue to be absorbed. It is given if: °· The diagnosis is made early. °· The fallopian tube has not ruptured. °· You are considered to be a good candidate for the medicine. °Usually, pregnancy hormone blood levels are checked after methotrexate treatment. This is to be sure the medicine is effective. It may take 4-6 weeks for the pregnancy to be absorbed (though most pregnancies will be absorbed by 3 weeks). °Surgical treatment may be needed. A laparoscope may be used to remove the pregnancy tissue. If severe internal bleeding occurs, a cut (incision) may be made in the lower abdomen (laparotomy), and the ectopic   pregnancy is removed. This stops the bleeding. Part of the fallopian tube, or the whole tube, may be removed as well (salpingectomy). After surgery, pregnancy hormone tests may be done to be sure there is no pregnancy tissue left. You may receive a Rho (D) immune globulin shot if you are Rh negative  and the father is Rh positive, or if you do not know the Rh type of the father. This is to prevent problems with any future pregnancy. °SEEK IMMEDIATE MEDICAL CARE IF:  °You have any symptoms of an ectopic pregnancy. This is a medical emergency. °MAKE SURE YOU: °· Understand these instructions. °· Will watch your condition. °· Will get help right away if you are not doing well or get worse. °Document Released: 09/27/2004 Document Revised: 01/04/2014 Document Reviewed: 03/19/2013 °ExitCare® Patient Information ©2015 [employer]. This information is not intended to replace advice given to you by your health care provider. Make sure you discuss any questions you have with your health care provider. ° ° °

## 2014-11-02 NOTE — MAU Note (Signed)
Pt here for Day 4 F/U BHCG post MTX.  Pt states she had pain last night & took percocet, no pain this a.m.  Pt states she is bleeding like a period, has had on one pad this morning.

## 2014-11-05 ENCOUNTER — Inpatient Hospital Stay (HOSPITAL_COMMUNITY)
Admission: AD | Admit: 2014-11-05 | Discharge: 2014-11-05 | Disposition: A | Discharge status: Home / Self Care | Payer: BLUE CROSS/BLUE SHIELD | Source: Ambulatory Visit | Attending: Obstetrics and Gynecology | Admitting: Obstetrics and Gynecology

## 2014-11-05 DIAGNOSIS — O001 Tubal pregnancy: Secondary | ICD-10-CM | POA: Insufficient documentation

## 2014-11-05 DIAGNOSIS — O009 Unspecified ectopic pregnancy without intrauterine pregnancy: Secondary | ICD-10-CM

## 2014-11-05 LAB — COMPREHENSIVE METABOLIC PANEL
ALT: 47 U/L — ABNORMAL HIGH (ref 0–35)
AST: 21 U/L (ref 0–37)
Albumin: 3.9 g/dL (ref 3.5–5.2)
Alkaline Phosphatase: 48 U/L (ref 39–117)
Anion gap: 7 (ref 5–15)
BUN: 8 mg/dL (ref 6–23)
CO2: 29 mmol/L (ref 19–32)
Calcium: 9.1 mg/dL (ref 8.4–10.5)
Chloride: 103 mmol/L (ref 96–112)
Creatinine, Ser: 0.47 mg/dL — ABNORMAL LOW (ref 0.50–1.10)
GFR calc Af Amer: >90 mL/min (ref >90)
GFR calc non Af Amer: >90 mL/min (ref >90)
Glucose, Bld: 88 mg/dL (ref 70–99)
Potassium: 3.8 mmol/L (ref 3.5–5.1)
Sodium: 139 mmol/L (ref 135–145)
Total Bilirubin: 0.4 mg/dL (ref 0.3–1.2)
Total Protein: 6.5 g/dL (ref 6.0–8.3)

## 2014-11-05 LAB — HCG, QUANTITATIVE, PREGNANCY: hCG, Beta Chain, Quant, S: 27 m[IU]/mL — ABNORMAL HIGH (ref <5)

## 2014-11-05 NOTE — Discharge Instructions (Signed)
Methotrexate Treatment for an Ectopic Pregnancy, Care After °Refer to this sheet in the next few weeks. These instructions provide you with information on caring for yourself after your procedure. Your health care provider may also give you more specific instructions. Your treatment has been planned according to current medical practices, but problems sometimes occur. Call your health care provider if you have any problems or questions after your procedure. °WHAT TO EXPECT AFTER THE PROCEDURE °You may have some abdominal cramping, vaginal bleeding, and fatigue in the first few days after taking methotrexate. Some other possible side effects of methotrexate include: °· Nausea. °· Vomiting. °· Diarrhea. °· Mouth sores. °· Swelling or irritation of the lining of your lungs (pneumonitis). °· Liver damage. °· Hair loss. °HOME CARE INSTRUCTIONS  °After you have received the methotrexate medicine, you need to be careful of your activities and watch your condition for several weeks. It may take 1 week before your hormone levels return to normal. °· Keep all follow-up appointments as directed by your health care provider. °· Avoid traveling too far away from your health care provider. °· Do not have sexual intercourse until your health care provider says it is safe to do so. °· You may resume your usual diet. °· Limit strenuous activity. °· Do not take folic acid, prenatal vitamins, or other vitamins that contain folic acid. °· Do not take aspirin, ibuprofen, or naproxen (nonsteroidal anti-inflammatory drugs [NSAIDs]). °· Do not drink alcohol. °SEEK MEDICAL CARE IF:  °· You cannot control your nausea and vomiting. °· You cannot control your diarrhea. °· You have sores in your mouth and want treatment. °· You need pain medicine for your abdominal pain. °· You have a rash. °· You are having a reaction to the medicine. °SEEK IMMEDIATE MEDICAL CARE IF:  °· You have increasing abdominal or pelvic pain. °· You notice increased  bleeding. °· You feel light-headed, or you faint. °· You have shortness of breath. °· Your heart rate increases. °· You have a cough. °· You have chills. °· You have a fever. °Document Released: 08/09/2011 Document Revised: 08/25/2013 Document Reviewed: 06/08/2013 °ExitCare® Patient Information ©2015 [employer]. This information is not intended to replace advice given to you by your health care provider. Make sure you discuss any questions you have with your health care provider. ° °

## 2014-11-05 NOTE — MAU Provider Note (Signed)
Ms. Jamie Waters is a 24 y.o. G1P0 at [id number] who presents to MAU today for Day #7 MTX follow up labs. The patient states that she has noted a slight increase in bleeding with few small clots. She denies pain now. She states some cramping like with a period last night that resolved with Percocet. She denies fever.   BP 108/65 mmHg  Pulse 87  Temp(Src) 98.6 F (37 C) (Oral)  Resp 16  LMP 09/18/2014 (Approximate) GENERAL: Well-developed, well-nourished female in no acute distress.  HEENT: Normocephalic, atraumatic.   LUNGS: Effort normal HEART: Regular rate  SKIN: Warm, dry and without erythema PSYCH: Normal mood and affect  Results for Jamie ReadyMANUEL, Jamie Waters (MRN [id number]) as of 11/05/2014 08:34  Ref. Range 10/29/2014 22:25 10/29/2014 22:30 10/29/2014 22:44 10/29/2014 22:55 10/30/2014 00:20 10/30/2014 01:10 11/02/2014 08:40 11/02/2014 08:41 11/05/2014 07:28  hCG, Beta Chain, Quant, S Latest Range: <5 mIU/mL 449 (H)      121 (H)  27 (H)   MDM Discussed patient and labs with Dr. Tenny Crawoss. Ok to follow-up for labs only in the office next Friday.   A: Ectopic pregnancy s/p MTX  P: Discharge home Bleeding precautions discussed Patient advised to follow-up for labs only in the office next Friday or sooner PRN Patient may return to MAU as needed or if her condition were to change or worsen,  Marny LowensteinJulie N Wenzel, PA-C 11/05/2014 8:35 AM

## 2014-11-05 NOTE — MAU Note (Signed)
Pt here for F/U BHCG post MTX.  Pt denies pain this a.m. But states she had pain last night which was relieved by percocet.  States she is bleeding more than previously passing small clots.  Changed one pad during the night.

## 2014-12-14 ENCOUNTER — Emergency Department (HOSPITAL_COMMUNITY): Payer: BLUE CROSS/BLUE SHIELD

## 2014-12-14 ENCOUNTER — Encounter (HOSPITAL_COMMUNITY): Payer: Self-pay

## 2014-12-14 ENCOUNTER — Emergency Department (HOSPITAL_COMMUNITY)
Admission: EM | Admit: 2014-12-14 | Discharge: 2014-12-15 | Disposition: A | Discharge status: Home / Self Care | Payer: BLUE CROSS/BLUE SHIELD | Attending: Emergency Medicine | Admitting: Emergency Medicine

## 2014-12-14 DIAGNOSIS — Z8639 Personal history of other endocrine, nutritional and metabolic disease: Secondary | ICD-10-CM | POA: Diagnosis not present

## 2014-12-14 DIAGNOSIS — N201 Calculus of ureter: Secondary | ICD-10-CM | POA: Diagnosis not present

## 2014-12-14 DIAGNOSIS — M1990 Unspecified osteoarthritis, unspecified site: Secondary | ICD-10-CM | POA: Diagnosis not present

## 2014-12-14 DIAGNOSIS — R102 Pelvic and perineal pain: Secondary | ICD-10-CM

## 2014-12-14 DIAGNOSIS — R109 Unspecified abdominal pain: Secondary | ICD-10-CM | POA: Diagnosis present

## 2014-12-14 DIAGNOSIS — Z79899 Other long term (current) drug therapy: Secondary | ICD-10-CM | POA: Insufficient documentation

## 2014-12-14 DIAGNOSIS — Z3202 Encounter for pregnancy test, result negative: Secondary | ICD-10-CM | POA: Diagnosis not present

## 2014-12-14 DIAGNOSIS — M797 Fibromyalgia: Secondary | ICD-10-CM | POA: Insufficient documentation

## 2014-12-14 DIAGNOSIS — Z8679 Personal history of other diseases of the circulatory system: Secondary | ICD-10-CM | POA: Insufficient documentation

## 2014-12-14 DIAGNOSIS — J45909 Unspecified asthma, uncomplicated: Secondary | ICD-10-CM | POA: Diagnosis not present

## 2014-12-14 DIAGNOSIS — Z791 Long term (current) use of non-steroidal anti-inflammatories (NSAID): Secondary | ICD-10-CM | POA: Insufficient documentation

## 2014-12-14 DIAGNOSIS — N23 Unspecified renal colic: Secondary | ICD-10-CM

## 2014-12-14 HISTORY — DX: Unspecified ectopic pregnancy without intrauterine pregnancy: O00.90

## 2014-12-14 LAB — COMPREHENSIVE METABOLIC PANEL
ALT: 13 U/L (ref 0–35)
AST: 19 U/L (ref 0–37)
Albumin: 4.6 g/dL (ref 3.5–5.2)
Alkaline Phosphatase: 59 U/L (ref 39–117)
Anion gap: 12 (ref 5–15)
BUN: 10 mg/dL (ref 6–23)
CO2: 17 mmol/L — ABNORMAL LOW (ref 19–32)
Calcium: 9.2 mg/dL (ref 8.4–10.5)
Chloride: 108 mmol/L (ref 96–112)
Creatinine, Ser: 0.57 mg/dL (ref 0.50–1.10)
GFR calc Af Amer: >90 mL/min (ref >90)
GFR calc non Af Amer: >90 mL/min (ref >90)
Glucose, Bld: 83 mg/dL (ref 70–99)
Potassium: 3.4 mmol/L — ABNORMAL LOW (ref 3.5–5.1)
Sodium: 137 mmol/L (ref 135–145)
Total Bilirubin: 0.4 mg/dL (ref 0.3–1.2)
Total Protein: 8 g/dL (ref 6.0–8.3)

## 2014-12-14 LAB — URINALYSIS, ROUTINE W REFLEX MICROSCOPIC
Bilirubin Urine: NEGATIVE
Glucose, UA: NEGATIVE mg/dL
Ketones, ur: NEGATIVE mg/dL
Nitrite: NEGATIVE
Protein, ur: NEGATIVE mg/dL
Specific Gravity, Urine: >1.03 — ABNORMAL HIGH (ref 1.005–1.030)
Urobilinogen, UA: 0.2 mg/dL (ref 0.0–1.0)
pH: 5.5 (ref 5.0–8.0)

## 2014-12-14 LAB — URINE MICROSCOPIC-ADD ON

## 2014-12-14 LAB — CBC WITH DIFFERENTIAL/PLATELET
Basophils Absolute: 0 10*3/uL (ref 0.0–0.1)
Basophils Relative: 0 % (ref 0–1)
Eosinophils Absolute: 0.3 10*3/uL (ref 0.0–0.7)
Eosinophils Relative: 2 % (ref 0–5)
HCT: 37.9 % (ref 36.0–46.0)
Hemoglobin: 12.8 g/dL (ref 12.0–15.0)
Lymphocytes Relative: 30 % (ref 12–46)
Lymphs Abs: 3.1 10*3/uL (ref 0.7–4.0)
MCH: 30.5 pg (ref 26.0–34.0)
MCHC: 33.8 g/dL (ref 30.0–36.0)
MCV: 90.5 fL (ref 78.0–100.0)
Monocytes Absolute: 0.6 10*3/uL (ref 0.1–1.0)
Monocytes Relative: 6 % (ref 3–12)
Neutro Abs: 6.4 10*3/uL (ref 1.7–7.7)
Neutrophils Relative %: 62 % (ref 43–77)
Platelets: 362 10*3/uL (ref 150–400)
RBC: 4.19 MIL/uL (ref 3.87–5.11)
RDW: 12.7 % (ref 11.5–15.5)
WBC: 10.4 10*3/uL (ref 4.0–10.5)

## 2014-12-14 LAB — HCG, QUANTITATIVE, PREGNANCY: hCG, Beta Chain, Quant, S: <1 m[IU]/mL (ref <5)

## 2014-12-14 LAB — PREGNANCY, URINE: Preg Test, Ur: NEGATIVE

## 2014-12-14 LAB — LIPASE, BLOOD: Lipase: 32 U/L (ref 11–59)

## 2014-12-14 MED ORDER — OXYCODONE-ACETAMINOPHEN 5-325 MG PO TABS
2.0000 | ORAL_TABLET | Freq: Once | ORAL | Status: AC
  Administered 2014-12-15: 2 via ORAL
  Filled 2014-12-14: qty 2

## 2014-12-14 MED ORDER — KETOROLAC TROMETHAMINE 60 MG/2ML IM SOLN
60.0000 mg | Freq: Once | INTRAMUSCULAR | Status: AC
  Administered 2014-12-15: 60 mg via INTRAMUSCULAR
  Filled 2014-12-14: qty 2

## 2014-12-14 NOTE — ED Notes (Signed)
Patient had an ectopic pregnancy on the right side in February and had a medication assisted procedure done to end the pregnancy.

## 2014-12-14 NOTE — ED Provider Notes (Signed)
CSN: [id number]     Arrival date & time 12/14/14  1945 History   First MD Initiated Contact with Patient 12/14/14 2030     Chief Complaint  Patient presents with  . Abdominal Pain      HPI Pt was seen at 2045. Per pt, c/o sudden onset and persistence of waxing and waning right sided low back "pain" that began last night.  Pt describes the pain as "sharp," and radiating into the right side of her abd.  Has been associated with nausea. Pain worsens with body position changes. Pt currently has menses. Pt endorses tx of ectopic pregnancy in 10/2014, but states she did f/u with her OB/GYN MD and was told "everything was ok." Denies vaginal discharge, no dysuria/hematuria, no abd pain, no diarrhea, no black or blood in emesis, no CP/SOB.    Past Medical History  Diagnosis Date  . Asthma   . Raynauds syndrome   . Raynauds syndrome 2015  . Fibromyalgia   . Arthritis   . PCOS (polycystic ovarian syndrome) 2015  . Raynaud disease   . Ectopic fetus    Past Surgical History  Procedure Laterality Date  . Cyst removed from throat    . Thyroidectomy, partial  2010   Family History  Problem Relation Age of Onset  . Lupus Maternal Grandmother   . Cancer Maternal Grandmother   . Cancer Paternal Grandfather    History  Substance Use Topics  . Smoking status: Never Smoker   . Smokeless tobacco: Not on file  . Alcohol Use: No   OB History    Gravida Para Term Preterm AB TAB SAB Ectopic Multiple Living   1         0     Review of Systems ROS: Statement: All systems negative except as marked or noted in the HPI; Constitutional: Negative for fever and chills. ; ; Eyes: Negative for eye pain, redness and discharge. ; ; ENMT: Negative for ear pain, hoarseness, nasal congestion, sinus pressure and sore throat. ; ; Cardiovascular: Negative for chest pain, palpitations, diaphoresis, dyspnea and peripheral edema. ; ; Respiratory: Negative for cough, wheezing and stridor. ; ; Gastrointestinal: +abd  pain, nausea. Negative for vomiting, diarrhea, blood in stool, hematemesis, jaundice and rectal bleeding. . ; ; Genitourinary: Negative for dysuria, flank pain and hematuria. ; ; GYN:  +menses, no vaginal discharge, no vulvar pain. ;; Musculoskeletal: +back pain. Negative for neck pain. Negative for swelling and trauma.; ; Skin: Negative for pruritus, rash, abrasions, blisters, bruising and skin lesion.; ; Neuro: Negative for headache, lightheadedness and neck stiffness. Negative for weakness, altered level of consciousness , altered mental status, extremity weakness, paresthesias, involuntary movement, seizure and syncope.      Allergies  Norvasc  Home Medications   Prior to Admission medications   Medication Sig Start Date End Date Taking? Authorizing Provider  ALPRAZolam Prudy Feeler) 0.5 MG tablet Take 0.5 mg by mouth 2 (two) times daily as needed. For sleep/anxiety   Yes Historical Provider, MD  cyclobenzaprine (FLEXERIL) 10 MG tablet Take 5 mg by mouth at bedtime. 11/18/14  Yes Historical Provider, MD  hydroxychloroquine (PLAQUENIL) 200 MG tablet Take 200 mg by mouth 2 (two) times daily. 10/13/14  Yes Historical Provider, MD  MINASTRIN 24 FE 1-20 MG-MCG(24) CHEW Chew 1 tablet by mouth daily. 11/16/14  Yes Historical Provider, MD  naproxen sodium (ANAPROX) 550 MG tablet Take 550 mg by mouth 2 (two) times daily. *May take twice daily 11/16/14  Yes Historical Provider,  MD  SUMAtriptan (IMITREX) 100 MG tablet Take 100 mg by mouth every 2 (two) hours as needed for migraine.    Yes Historical Provider, MD  topiramate (TOPAMAX) 100 MG tablet Take 100-200 mg by mouth daily.  11/18/14  Yes Historical Provider, MD   BP 122/89 mmHg  Pulse 104  Temp(Src) 98.8 F (37.1 C) (Oral)  Resp 16  Ht 5' (1.524 m)  Wt 180 lb (81.647 kg)  BMI 35.15 kg/m2  SpO2 100%  LMP 12/03/2014 (Exact Date)  Breastfeeding? Unknown Physical Exam  2050: Physical examination:  Nursing notes reviewed; Vital signs and O2 SAT  reviewed;  Constitutional: Well developed, Well nourished, Well hydrated, In no acute distress; Head:  Normocephalic, atraumatic; Eyes: EOMI, PERRL, No scleral icterus; ENMT: Mouth and pharynx normal, Mucous membranes moist; Neck: Supple, Full range of motion, No lymphadenopathy; Cardiovascular: Regular rate and rhythm, No murmur, rub, or gallop; Respiratory: Breath sounds clear & equal bilaterally, No rales, rhonchi, wheezes.  Speaking full sentences with ease, Normal respiratory effort/excursion; Chest: Nontender, Movement normal; Abdomen: Soft, Nontender, Nondistended, Normal bowel sounds; Genitourinary: No CVA tenderness; Spine:  No midline CS, TS, LS tenderness.;; Extremities: Pulses normal, No tenderness, No edema, No calf edema or asymmetry.; Neuro: AA&Ox3, Major CN grossly intact.  Speech clear. No gross focal motor or sensory deficits in extremities.; Skin: Color normal, Warm, Dry.   ED Course  Procedures     EKG Interpretation None      MDM  MDM Reviewed: previous chart, nursing note and vitals Reviewed previous: labs Interpretation: labs, CT scan and ultrasound     Results for orders placed or performed during the hospital encounter of 12/14/14  Urinalysis, Routine w reflex microscopic  Result Value Ref Range   Color, Urine YELLOW YELLOW   APPearance CLEAR CLEAR   Specific Gravity, Urine >1.030 (H) 1.005 - 1.030   pH 5.5 5.0 - 8.0   Glucose, UA NEGATIVE NEGATIVE mg/dL   Hgb urine dipstick LARGE (A) NEGATIVE   Bilirubin Urine NEGATIVE NEGATIVE   Ketones, ur NEGATIVE NEGATIVE mg/dL   Protein, ur NEGATIVE NEGATIVE mg/dL   Urobilinogen, UA 0.2 0.0 - 1.0 mg/dL   Nitrite NEGATIVE NEGATIVE   Leukocytes, UA MODERATE (A) NEGATIVE  Pregnancy, urine  Result Value Ref Range   Preg Test, Ur NEGATIVE NEGATIVE  Comprehensive metabolic panel  Result Value Ref Range   Sodium 137 135 - 145 mmol/L   Potassium 3.4 (L) 3.5 - 5.1 mmol/L   Chloride 108 96 - 112 mmol/L   CO2 17 (L) 19 -  32 mmol/L   Glucose, Bld 83 70 - 99 mg/dL   BUN 10 6 - 23 mg/dL   Creatinine, Ser 4.09 0.50 - 1.10 mg/dL   Calcium 9.2 8.4 - 81.1 mg/dL   Total Protein 8.0 6.0 - 8.3 g/dL   Albumin 4.6 3.5 - 5.2 g/dL   AST 19 0 - 37 U/L   ALT 13 0 - 35 U/L   Alkaline Phosphatase 59 39 - 117 U/L   Total Bilirubin 0.4 0.3 - 1.2 mg/dL   GFR calc non Af Amer >90 >90 mL/min   GFR calc Af Amer >90 >90 mL/min   Anion gap 12 5 - 15  Lipase, blood  Result Value Ref Range   Lipase 32 11 - 59 U/L  CBC with Differential  Result Value Ref Range   WBC 10.4 4.0 - 10.5 K/uL   RBC 4.19 3.87 - 5.11 MIL/uL   Hemoglobin 12.8 12.0 - 15.0  g/dL   HCT 16.137.9 09.636.0 - 04.546.0 %   MCV 90.5 78.0 - 100.0 fL   MCH 30.5 26.0 - 34.0 pg   MCHC 33.8 30.0 - 36.0 g/dL   RDW 40.912.7 81.111.5 - 91.415.5 %   Platelets 362 150 - 400 K/uL   Neutrophils Relative % 62 43 - 77 %   Neutro Abs 6.4 1.7 - 7.7 K/uL   Lymphocytes Relative 30 12 - 46 %   Lymphs Abs 3.1 0.7 - 4.0 K/uL   Monocytes Relative 6 3 - 12 %   Monocytes Absolute 0.6 0.1 - 1.0 K/uL   Eosinophils Relative 2 0 - 5 %   Eosinophils Absolute 0.3 0.0 - 0.7 K/uL   Basophils Relative 0 0 - 1 %   Basophils Absolute 0.0 0.0 - 0.1 K/uL  hCG, quantitative, pregnancy  Result Value Ref Range   hCG, Beta Chain, Quant, S <1 <5 mIU/mL  Urine microscopic-add on  Result Value Ref Range   Squamous Epithelial / LPF MANY (A) RARE   WBC, UA 21-50 <3 WBC/hpf   RBC / HPF 21-50 <3 RBC/hpf   Bacteria, UA MANY (A) RARE   [place] Ob Transvaginal 12/14/2014   CLINICAL DATA:  Pelvic pain. Recent ectopic pregnancy. Urine and quantitative HCG have not been collected.  EXAM: OBSTETRIC <14 WK US AND TRANSVAGINAL OB  US DOPPLER ULTRASOUND OF OVARIES  TECHNIQUE: Both transabdominal and transvaginal ultrasound examinations were performed for complete evaluation of the gestation as well as the maternal uterus, adnexal regions, and pelvic cul-de-sac. Transvaginal technique was performed to assess early pregnancy.  Color and  duplex Doppler ultrasound was utilized to evaluate blood flow to the ovaries.  COMPARISON:  10/30/2014  FINDINGS: No intra or extrauterine gestational sac is identified. No adnexal mass or free pelvic fluid. The uterus appears normal.  Pulsed Doppler evaluation of both ovaries demonstrates normal appearing low-resistance arterial and venous waveforms.  IMPRESSION: 1. Normal pelvic ultrasound.  No ovarian torsion. 2. Presumed ectopic pregnancy on the prior study is no long visible. Noted pending pregnancy test. If the patient is currently pregnant, differential considerations for this scan include intrauterine gestation too early to be sonographically visualized, spontaneous abortion, or ectopic pregnancy. Consider follow-up ultrasound in 10 days and serial quantitative beta HCG follow-up.   Electronically Signed   By: Marnee SpringJonathon  Watts M.D.   On: 12/14/2014 22:22   Ct Abdomen Pelvis Wo Contrast 12/14/2014   CLINICAL DATA:  Right flank pain with hematuria.  EXAM: CT ABDOMEN AND PELVIS WITHOUT CONTRAST  TECHNIQUE: Multidetector CT imaging of the abdomen and pelvis was performed following the standard protocol without IV contrast.  COMPARISON:  None.  FINDINGS: BODY WALL: No contributory findings.  LOWER CHEST: No contributory findings.  ABDOMEN/PELVIS:  Liver: No focal abnormality.  Biliary: No evidence of biliary obstruction or stone.  Pancreas: Unremarkable.  Spleen: Unremarkable.  Adrenals: Unremarkable.  Kidneys and ureters: 2 x 4 mm stone in the upper right ureter with moderate hydronephrosis. No additional urolithiasis.  Bladder: Unremarkable.  Reproductive: No pathologic findings.  Bowel: No obstruction. Normal appendix.  Retroperitoneum: No mass or adenopathy.  Peritoneum: No ascites or pneumoperitoneum.  Vascular: No acute abnormality.  OSSEOUS: No acute abnormalities.  IMPRESSION: Obstructing 2 x 4 mm stone in the upper right ureter.   Electronically Signed   By: Marnee SpringJonathon  Watts M.D.   On: 12/14/2014 23:57      2355:  Udip contaminated; UC pending. Will tx for ureteral colic, f/u Uro MD. Pt  states she is ready to go home now.  Dx and testing d/w pt and family.  Questions answered.  Verb understanding, agreeable to d/c home with outpt f/u.      Samuel Jester, DO 12/16/14 1717

## 2014-12-14 NOTE — ED Notes (Signed)
Patient states that she started having back pain to begin with, then she was cooking supper and started having right sided abdominal pain that was sharp and doubled her over. States that it is worse when standing. That it eases off when she sits. Nauseated at this time. Patient states that she is having chills also.

## 2014-12-15 MED ORDER — OXYCODONE-ACETAMINOPHEN 5-325 MG PO TABS: ORAL_TABLET | ORAL | Status: DC

## 2014-12-15 MED ORDER — NAPROXEN 250 MG PO TABS
250.0000 mg | ORAL_TABLET | Freq: Two times a day (BID) | ORAL | Status: DC | PRN
Start: 2014-12-15 — End: 2015-09-12

## 2014-12-15 MED ORDER — TAMSULOSIN HCL 0.4 MG PO CAPS: 0.4000 mg | ORAL_CAPSULE | Freq: Every day | ORAL | Status: DC

## 2014-12-15 MED ORDER — ONDANSETRON HCL 4 MG PO TABS: 4.0000 mg | ORAL_TABLET | Freq: Three times a day (TID) | ORAL | Status: DC | PRN

## 2014-12-15 NOTE — Discharge Instructions (Signed)
°Emergency Department Resource Guide °1) Find a Doctor and Pay Out of Pocket °Although you won't have to find out who is covered by your insurance plan, it is a good idea to ask around and get recommendations. You will then need to call the office and see if the doctor you have chosen will accept you as a new patient and what types of options they offer for patients who are self-pay. Some doctors offer discounts or will set up payment plans for their patients who do not have insurance, but you will need to ask so you aren't surprised when you get to your appointment. ° °2) Contact Your Local Health Department °Not all health departments have doctors that can see patients for sick visits, but many do, so it is worth a call to see if yours does. If you don't know where your local health department is, you can check in your phone book. The CDC also has a tool to help you locate your state's health department, and many state websites also have listings of all of their local health departments. ° °3) Find a Walk-in Clinic °If your illness is not likely to be very severe or complicated, you may want to try a walk in clinic. These are popping up all over the country in pharmacies, drugstores, and shopping centers. They're usually staffed by nurse practitioners or physician assistants that have been trained to treat common illnesses and complaints. They're usually fairly quick and inexpensive. However, if you have serious medical issues or chronic medical problems, these are probably not your best option. ° °No Primary Care Doctor: °- Call [facility] at  832-8000 - they can help you locate a primary care doctor that  accepts your insurance, provides certain services, etc. °- Physician Referral Service- 1-800-533-3463 ° °Chronic Pain Problems: °Organization         Address  Phone   Notes  °[facility]  (336) 297-2271 Patients need to be referred by their primary care doctor.  ° °Medication  Assistance: °Organization         Address  Phone   Notes  °[place] [facility] [address] °[place], [place] [address] (336) 641-8030 --Must be a resident of [place] °-- Must have NO insurance coverage whatsoever (no Medicaid/ Medicare, etc.) °-- The pt. MUST have a primary care doctor that directs their care regularly and follows them in the community °  °[facility]  (866) 331-1348   °[employer]  (888) 892-1162   ° °Agencies that provide inexpensive medical care: °Organization         Address  Phone   Notes  °[facility]  (336) 832-8035   °[facility]    (336) 832-7272   °[facility] [address] °[place], [place] [address] (336) 832-4777   °[facility] [address], °[place] (336) 271-4999   °[facility]    (336) 373-0678   °[facility]    (336) 272-1050   °[facility] ° [address], [place] Phone:  (336) 832-4444, Fax:  (336) 832-4440 Hours of Operation:  9 am - 6 pm, M-F.  Also accepts Medicaid/Medicare and self-pay.  °[facility] ° [address], [place] Phone: (336) 832-3150, Fax: (336) 832-3151. Hours of Operation:  8:30 am - 5:30 pm, M-F.  Also accepts Medicaid and self-pay.  °[facility] [place] [address], [place] Phone: (336) 878-6027   °[facility] [address], [place], [place] (336)723-1848, Ext. 123 Mondays & Thursdays: 7-9 AM.  First 15 patients are seen on a first come, first serve basis. °  ° °Medicaid-accepting [place] Providers: ° °Organization         Address  Phone   Notes  °[facility] [address], [place] (336) 641-2100 Also accepts self-pay patients.  °[facility] [address], [place] ° (336) 856-9996   °[facility] [address], [place]  (336) 288-8857   °[facility] [address] [place] [address], [place] (336) 299-7000   °Veita Bland [address], [place]  ° (336) 373-1557 Only accepts [place] Access Medicaid patients after they have their name applied to their card.  ° °Self-Pay (no insurance) in [place]: ° °Organization         Address  Phone   Notes  °Sickle Cell Patients, [facility] [address], [place] (336) 832-1970   °[facility] [address], [place] (336) 832-4400   °[facility] ° [address], [place] (336) 992-4800   °[facility]/Dr. Osei-Bonsu ° [address] [place] [address], [place] or [address], [place] (336) 841-8500 Phone number for both [place] and [place] locations is the same.  °[facility] [address], [place] (336) 299-0000   °[facility] [address] [place] [address],  [place] or [address] (336) 852-7530 °(336) 878-2260   °[facility] [address], [place] (336) 350-1642, phone; (336) 294-5005, fax Sees patients 1st and 3rd Saturday of every month.  Must not qualify for public or private insurance (i.e. Medicaid, Medicare, [place] Health Choice, Veterans' Benefits) • Household income should be no more than 200% of the poverty level •The clinic cannot treat you if you are pregnant or think you are pregnant • Sexually transmitted diseases are not treated at the clinic.  ° ° °Dental Care: °Organization         Address  Phone  Notes  °[place] [facility] [address], [place] (336) 641-6152 Accepts children up to age 21 who are enrolled in Medicaid or [place] Health Choice; pregnant women with a Medicaid card; and children who have applied for Medicaid or [place] Health Choice, but were declined, whose parents can pay a reduced fee at time of service.  °[place]  [facility] [place]  [address], [place] (336) 641-7733 Accepts children up to age 21 who are enrolled in Medicaid or [place] Health Choice; pregnant women with a Medicaid card; and children who have applied for Medicaid or [place] Health Choice, but were declined, whose parents can pay a reduced fee at time of service.  °[facility] ° [address],  (336) 641-4533 Patients are seen by appointment only. Walk-ins are not accepted. [facility] will see patients 18 years of age and older. °Monday - Tuesday (8am-5pm) °Most Wednesdays (8:30-5pm) °$30 per visit, cash only  °[facility] ° [address], [place] (336) 641-4533 Patients are seen by appointment only. Walk-ins are not accepted. [facility] will see patients 18 years of age and older. °One   Wednesday Evening (Monthly: Volunteer Based).  $30 per visit, cash only  °[facility]  (919) 537-3737 for adults; Children under age 4, call Graduate Pediatric Dentistry at (919) [phone]. Children aged 4-14, please call (919) [phone] to request a pediatric application. ° Dental services are provided in all areas of dental care including fillings, crowns and bridges, complete and partial dentures, implants, gum treatment, root canals, and extractions. Preventive care is also provided. Treatment is provided to both adults and children. °Patients are selected via a lottery and there is often a waiting list. °  °[facility] [address], °[place] ° (336) 763-8833 [email or web address] °  °[facility] [address], [place], [place] (336)723-1848, Ext. 123 Second and Fourth Thursday of each month, opens at 6:30 AM; Clinic ends at 9 AM.  Patients are seen on a first-come first-served basis, and a limited number are seen during each clinic.  ° °[facility] ° [address], [place], [place] (336) 723-7904    Eligibility Requirements °You must have lived in [place], [place], or [place] counties for at least the last three months. °  You cannot be eligible for state or federal sponsored healthcare insurance, including Veterans Administration, Medicaid, or Medicare. °  You generally cannot be eligible for healthcare insurance through your employer.  °  How to apply: °Eligibility screenings are held every Tuesday and Wednesday afternoon from 1:00 pm until 4:00 pm. You do not need an appointment for the interview!  °[facility] [address], [place], [place] 336-631-2330   °[facility]  336-342-8273   °[facility]  336-703-3100   °[facility]  336-570-6415   ° °Behavioral Health Resources in the Community: °Intensive Outpatient Programs °Organization         Address  Phone  Notes  °[facility] [address], [place], [place] 336-878-6098   °[facility] [address], [place], [place] 336-832-9800   °[facility] [address], [place], [place] ° 336-882-2125   °[facility] [address],  °[place], [place] 1-800-853-5163 or 336-641-4981   °Substance Abuse Resources °Organization         Address  Phone  Notes  °[facility]  336-882-2125   °[facility]  336-784-9470   °[facility]  336-285-9073   °[facility]  336-845-3988   °Residential & Outpatient Substance Abuse Program  1-800-659-3381   °Psychological Services °Organization         Address  Phone  Notes  °[facility]  336- 832-9600   °[facility]  336- 378-7881   °[facility] [address], [place] 1-800-853-5163 or 336-641-4981   ° °Mobile Crisis Teams °Organization         Address  Phone  Notes  °[facility], Mobile Crisis Care Unit  1-877-626-1772   °Assertive °[facility] ° [address]  [place], [place] 336-834-9664   °Sharon DeEsch [address] °[place] 336-554-5454   ° °Self-Help/Support Groups °Organization         Address  Phone             Notes  °[facility] [place] [facility] - variety of support groups  336- 373-1402 Call for more information  °Narcotics Anonymous (NA), [facility] [address], °[place]  2 meetings at this location  ° °  Residential Treatment Programs °Organization         Address  Phone  Notes  °[facility] [address],    °[place]  1-866-801-8205   °[facility] ° [address], [place], [place] 704-293-8524   °[facility] [address], [place] 336-845-3988 Admissions: 8am-3pm M-F  °[facility] [address]    °[place], [place] 336-841-1104   °[facility] [address], [place], [place] 336-379-7146   °[facility] [address]  °[place], [place] 336-285-9073   °[facility] - Intensive Outpatient [address], [place], [place] 336-852-3033   °[facility] [address]  °[place], [place] 1-877-615-2722 or 336-784-9470   °[facility] [address] [place], [place] 336-227-7417 Accepts Medicaid  °[facility] [address]  °[place] 1-800-659-3381 Substance Abuse/Addiction Treatment  ° °[place] Behavioral Health Resources °Organization         Address  Phone  Notes  °[facility]  (888) 581-9988   °Julie Brannon, PhD [address] [place], [place]   (336) 349-5553 or (336) 951-0000   °[facility]   [address] °[place], [place] (336) 349-4454   °[facility] [address], [place], [place] (336) 342-8316 Insurance/Medicaid/sponsorship through [facility]  °[facility] [address]                                    [place], [place] (336) 342-8316 Therapy/tele-psych/case    °[facility] [address]  ° [place], [place] (336) 349-2233    °Dr. Arfeen  (336) 349-4544   °[facility] [place]  [employer] [place] [facility] 1) [address], [place] °2) [address], [place] °3)  [address], [place] (336) 349-3220 °(336) 342-7768 ° °(336) 342-8140   °[place] [facility] (336) 342-1394 or (336) 342-3537 (After Hours)    ° ° °Take the prescriptions as directed. Call the Urologist tomorrow to schedule a follow up appointment this week.  Return to the Emergency Department immediately if worsening. ° °

## 2014-12-16 LAB — URINE CULTURE

## 2015-03-01 ENCOUNTER — Other Ambulatory Visit (HOSPITAL_COMMUNITY): Payer: Self-pay | Admitting: Obstetrics and Gynecology

## 2015-03-01 DIAGNOSIS — Z8759 Personal history of other complications of pregnancy, childbirth and the puerperium: Secondary | ICD-10-CM

## 2015-03-03 ENCOUNTER — Ambulatory Visit (HOSPITAL_COMMUNITY): Admission: RE | Admit: 2015-03-03 | Payer: BLUE CROSS/BLUE SHIELD | Source: Ambulatory Visit

## 2015-03-08 ENCOUNTER — Ambulatory Visit (HOSPITAL_COMMUNITY)
Admission: RE | Admit: 2015-03-08 | Discharge: 2015-03-08 | Disposition: A | Discharge status: Home / Self Care | Payer: BLUE CROSS/BLUE SHIELD | Source: Ambulatory Visit | Attending: Obstetrics and Gynecology | Admitting: Obstetrics and Gynecology

## 2015-03-08 DIAGNOSIS — Z8759 Personal history of other complications of pregnancy, childbirth and the puerperium: Secondary | ICD-10-CM

## 2015-03-08 MED ORDER — IOHEXOL 300 MG/ML  SOLN: 30.0000 mL | Freq: Once | INTRAMUSCULAR | Status: AC | PRN

## 2015-04-25 ENCOUNTER — Encounter (HOSPITAL_COMMUNITY): Payer: Self-pay | Admitting: *Deleted

## 2015-04-25 ENCOUNTER — Emergency Department (HOSPITAL_COMMUNITY)
Admission: EM | Admit: 2015-04-25 | Discharge: 2015-04-25 | Disposition: A | Discharge status: Home / Self Care | Payer: BLUE CROSS/BLUE SHIELD | Attending: Emergency Medicine | Admitting: Emergency Medicine

## 2015-04-25 ENCOUNTER — Emergency Department (HOSPITAL_COMMUNITY): Payer: BLUE CROSS/BLUE SHIELD

## 2015-04-25 DIAGNOSIS — J45909 Unspecified asthma, uncomplicated: Secondary | ICD-10-CM | POA: Diagnosis not present

## 2015-04-25 DIAGNOSIS — R0789 Other chest pain: Secondary | ICD-10-CM | POA: Diagnosis not present

## 2015-04-25 DIAGNOSIS — R079 Chest pain, unspecified: Secondary | ICD-10-CM | POA: Diagnosis present

## 2015-04-25 DIAGNOSIS — R1011 Right upper quadrant pain: Secondary | ICD-10-CM | POA: Insufficient documentation

## 2015-04-25 DIAGNOSIS — M1990 Unspecified osteoarthritis, unspecified site: Secondary | ICD-10-CM | POA: Diagnosis not present

## 2015-04-25 DIAGNOSIS — Z3202 Encounter for pregnancy test, result negative: Secondary | ICD-10-CM | POA: Diagnosis not present

## 2015-04-25 DIAGNOSIS — Z79899 Other long term (current) drug therapy: Secondary | ICD-10-CM | POA: Diagnosis not present

## 2015-04-25 DIAGNOSIS — Z8679 Personal history of other diseases of the circulatory system: Secondary | ICD-10-CM | POA: Insufficient documentation

## 2015-04-25 DIAGNOSIS — Z8639 Personal history of other endocrine, nutritional and metabolic disease: Secondary | ICD-10-CM | POA: Diagnosis not present

## 2015-04-25 DIAGNOSIS — R1013 Epigastric pain: Secondary | ICD-10-CM | POA: Insufficient documentation

## 2015-04-25 DIAGNOSIS — R0781 Pleurodynia: Secondary | ICD-10-CM

## 2015-04-25 LAB — CBC WITH DIFFERENTIAL/PLATELET
Basophils Absolute: 0 10*3/uL (ref 0.0–0.1)
Basophils Relative: 0 % (ref 0–1)
Eosinophils Absolute: 0.3 10*3/uL (ref 0.0–0.7)
Eosinophils Relative: 4 % (ref 0–5)
HCT: 40 % (ref 36.0–46.0)
Hemoglobin: 13.1 g/dL (ref 12.0–15.0)
Lymphocytes Relative: 35 % (ref 12–46)
Lymphs Abs: 2.7 10*3/uL (ref 0.7–4.0)
MCH: 30.8 pg (ref 26.0–34.0)
MCHC: 32.8 g/dL (ref 30.0–36.0)
MCV: 94.1 fL (ref 78.0–100.0)
Monocytes Absolute: 0.7 10*3/uL (ref 0.1–1.0)
Monocytes Relative: 10 % (ref 3–12)
Neutro Abs: 4 10*3/uL (ref 1.7–7.7)
Neutrophils Relative %: 51 % (ref 43–77)
Platelets: 299 10*3/uL (ref 150–400)
RBC: 4.25 MIL/uL (ref 3.87–5.11)
RDW: 12.9 % (ref 11.5–15.5)
WBC: 7.7 10*3/uL (ref 4.0–10.5)

## 2015-04-25 LAB — URINALYSIS, ROUTINE W REFLEX MICROSCOPIC
Bilirubin Urine: NEGATIVE
Glucose, UA: NEGATIVE mg/dL
Ketones, ur: NEGATIVE mg/dL
Nitrite: NEGATIVE
Protein, ur: NEGATIVE mg/dL
Specific Gravity, Urine: >1.03 — ABNORMAL HIGH (ref 1.005–1.030)
Urobilinogen, UA: 0.2 mg/dL (ref 0.0–1.0)
pH: 5.5 (ref 5.0–8.0)

## 2015-04-25 LAB — LIPASE, BLOOD: Lipase: 34 U/L (ref 22–51)

## 2015-04-25 LAB — URINE MICROSCOPIC-ADD ON

## 2015-04-25 LAB — COMPREHENSIVE METABOLIC PANEL
ALT: 39 U/L (ref 14–54)
AST: 20 U/L (ref 15–41)
Albumin: 4.2 g/dL (ref 3.5–5.0)
Alkaline Phosphatase: 39 U/L (ref 38–126)
Anion gap: 8 (ref 5–15)
BUN: 14 mg/dL (ref 6–20)
CO2: 25 mmol/L (ref 22–32)
Calcium: 8.8 mg/dL — ABNORMAL LOW (ref 8.9–10.3)
Chloride: 104 mmol/L (ref 101–111)
Creatinine, Ser: 0.53 mg/dL (ref 0.44–1.00)
GFR calc Af Amer: >60 mL/min (ref >60)
GFR calc non Af Amer: >60 mL/min (ref >60)
Glucose, Bld: 92 mg/dL (ref 65–99)
Potassium: 3.7 mmol/L (ref 3.5–5.1)
Sodium: 137 mmol/L (ref 135–145)
Total Bilirubin: 0.5 mg/dL (ref 0.3–1.2)
Total Protein: 7.4 g/dL (ref 6.5–8.1)

## 2015-04-25 LAB — D-DIMER, QUANTITATIVE: D-Dimer, Quant: <0.27 ug/mL-FEU (ref 0.00–0.48)

## 2015-04-25 LAB — PREGNANCY, URINE: Preg Test, Ur: NEGATIVE

## 2015-04-25 MED ORDER — KETOROLAC TROMETHAMINE 60 MG/2ML IM SOLN: 60.0000 mg | Freq: Once | INTRAMUSCULAR | Status: DC

## 2015-04-25 MED ORDER — IBUPROFEN 600 MG PO TABS: 600.0000 mg | ORAL_TABLET | Freq: Three times a day (TID) | ORAL | Status: DC

## 2015-04-25 MED ORDER — KETOROLAC TROMETHAMINE 30 MG/ML IJ SOLN
30.0000 mg | Freq: Once | INTRAMUSCULAR | Status: AC
  Administered 2015-04-25: 30 mg via INTRAVENOUS
  Filled 2015-04-25: qty 1

## 2015-04-25 NOTE — Discharge Instructions (Signed)

## 2015-04-25 NOTE — ED Notes (Signed)
Pt c/o right rib pain x 2 weeks with worsening of sx over the last couple of days; pt states it hurts to take a deep breath

## 2015-04-25 NOTE — ED Notes (Signed)
Pt states right side rib pain worse over the past 2 weeks. Pain worse when she takes a deep breath.

## 2015-04-25 NOTE — ED Provider Notes (Signed)
CSN: [id number]     Arrival date & time 04/25/15  0527 History   First MD Initiated Contact with Patient 04/25/15 0541     Chief Complaint  Patient presents with  . Chest Pain     (Consider location/radiation/quality/duration/timing/severity/associated sxs/prior Treatment) HPI Patient presents with 2 weeks of right lower chest pain. She denies any history of trauma. Pain is worse with deep breathing. She denies shortness of breath or cough. No fever or chills. No nausea vomiting or diarrhea. Pain is not exacerbated by eating. No new lower extremity swelling or pain. No recent extended travel. Past Medical History  Diagnosis Date  . Asthma   . Raynauds syndrome   . Raynauds syndrome 2015  . Fibromyalgia   . Arthritis   . PCOS (polycystic ovarian syndrome) 2015  . Raynaud disease   . Ectopic fetus    Past Surgical History  Procedure Laterality Date  . Cyst removed from throat    . Thyroidectomy, partial  2010   Family History  Problem Relation Age of Onset  . Lupus Maternal Grandmother   . Cancer Maternal Grandmother   . Cancer Paternal Grandfather    Social History  Substance Use Topics  . Smoking status: Never Smoker   . Smokeless tobacco: None  . Alcohol Use: No   OB History    Gravida Para Term Preterm AB TAB SAB Ectopic Multiple Living   1         0     Review of Systems  Constitutional: Negative for fever and chills.  Respiratory: Negative for cough and shortness of breath.   Cardiovascular: Positive for chest pain.  Gastrointestinal: Positive for abdominal pain. Negative for nausea, vomiting, diarrhea and constipation.  Musculoskeletal: Negative for back pain, neck pain and neck stiffness.  Skin: Negative for rash and wound.  Neurological: Negative for dizziness, weakness, light-headedness and numbness.  All other systems reviewed and are negative.     Allergies  Norvasc  Home Medications   Prior to Admission medications   Medication Sig Start Date  End Date Taking? Authorizing Provider  hydroxychloroquine (PLAQUENIL) 200 MG tablet Take 200 mg by mouth 2 (two) times daily. 10/13/14  Yes Historical Provider, MD  ibuprofen (ADVIL,MOTRIN) 600 MG tablet Take 1 tablet (600 mg total) by mouth 3 (three) times daily after meals. 04/25/15   Loren Racer, MD  naproxen (NAPROSYN) 250 MG tablet Take 1 tablet (250 mg total) by mouth 2 (two) times daily as needed for mild pain or moderate pain (take with food). 12/15/14   Samuel Jester, DO  ondansetron (ZOFRAN) 4 MG tablet Take 1 tablet (4 mg total) by mouth every 8 (eight) hours as needed for nausea or vomiting. 12/15/14   Samuel Jester, DO  oxyCODONE-acetaminophen (PERCOCET/ROXICET) 5-325 MG per tablet 1 or 2 tabs PO q6h prn pain 12/15/14   Samuel Jester, DO  tamsulosin (FLOMAX) 0.4 MG CAPS capsule Take 1 capsule (0.4 mg total) by mouth at bedtime. 12/15/14   Samuel Jester, DO   BP 102/58 mmHg  Pulse 65  Temp(Src) 98.4 F (36.9 C) (Oral)  Resp 18  Ht 5' (1.524 m)  Wt 168 lb (76.204 kg)  BMI 32.81 kg/m2  SpO2 99%  LMP 04/24/2015 Physical Exam  Constitutional: She is oriented to person, place, and time. She appears well-developed and well-nourished. No distress.  HENT:  Head: Normocephalic and atraumatic.  Mouth/Throat: Oropharynx is clear and moist.  Eyes: EOM are normal. Pupils are equal, round, and reactive to light.  Neck: Normal range of motion. Neck supple.  Cardiovascular: Normal rate and regular rhythm.  Exam reveals no gallop and no friction rub.   No murmur heard. Pulmonary/Chest: Effort normal and breath sounds normal. No respiratory distress. She has no wheezes. She has no rales. She exhibits tenderness (tenderness to palpation over the anterior inferior right sided ribs. There is no crepitance or deformity.).  Abdominal: Soft. Bowel sounds are normal. She exhibits no distension and no mass. There is tenderness (patient with tenderness to palpation in the right upper quadrants  and the epigastric regions. There is no rebound or guarding.). There is no rebound and no guarding.  Musculoskeletal: Normal range of motion. She exhibits no edema or tenderness.  No CVA tenderness bilaterally. Patient has no calf swelling or tenderness.  Neurological: She is alert and oriented to person, place, and time.  Skin: Skin is warm and dry. No rash noted. No erythema.  Psychiatric: She has a normal mood and affect. Her behavior is normal.  Nursing note and vitals reviewed.   ED Course  Procedures (including critical care time) Labs Review Labs Reviewed  COMPREHENSIVE METABOLIC PANEL - Abnormal; Notable for the following:    Calcium 8.8 (*)    All other components within normal limits  URINALYSIS, ROUTINE W REFLEX MICROSCOPIC (NOT AT [facility]) - Abnormal; Notable for the following:    Specific Gravity, Urine >1.030 (*)    Hgb urine dipstick LARGE (*)    Leukocytes, UA SMALL (*)    All other components within normal limits  URINE MICROSCOPIC-ADD ON - Abnormal; Notable for the following:    Squamous Epithelial / LPF FEW (*)    Bacteria, UA MANY (*)    All other components within normal limits  CBC WITH DIFFERENTIAL/PLATELET  LIPASE, BLOOD  PREGNANCY, URINE  D-DIMER, QUANTITATIVE (NOT AT [facility])    Imaging Review Dg Chest 2 View  04/25/2015   CLINICAL DATA:  Inspiratory right-sided chest pain  EXAM: CHEST  2 VIEW  COMPARISON:  12/31/2013  FINDINGS: The heart size and mediastinal contours are within normal limits. Both lungs are clear. The visualized skeletal structures are unremarkable.  IMPRESSION: No active cardiopulmonary disease.   Electronically Signed   By: Christiana Pellant M.D.   On: 04/25/2015 07:07   I have personally reviewed and evaluated these images and lab results as part of my medical decision-making.   EKG Interpretation None      MDM   Final diagnoses:  Pleuritic chest pain    Patient with recent CT abdomen pelvis 4/16 without any abnormality  visualized. Normal gallbladder.   Patient is currently on her period. She denies any dysuria, frequency, urgency. Questionable UTI on UA but given the patient is asymptomatic will culture and hold antibiotic treatment.  Patient likely has pleurisy versus costochondritis. We'll start on anti-inflammatory medication. Have instructed patient in use. She's been advised to follow-up with her primary physician should her symptoms not be improving. She's been encouraged to return immediately should she have worsening shortness of breath, fever, worsening pain or any concerns.  Loren Racer, MD 04/26/15 [phone]

## 2015-08-19 ENCOUNTER — Emergency Department (HOSPITAL_COMMUNITY): Payer: BLUE CROSS/BLUE SHIELD

## 2015-08-19 ENCOUNTER — Encounter (HOSPITAL_COMMUNITY): Payer: Self-pay | Admitting: *Deleted

## 2015-08-19 ENCOUNTER — Emergency Department (HOSPITAL_COMMUNITY)
Admission: EM | Admit: 2015-08-19 | Discharge: 2015-08-19 | Disposition: A | Discharge status: Home / Self Care | Payer: BLUE CROSS/BLUE SHIELD | Attending: Emergency Medicine | Admitting: Emergency Medicine

## 2015-08-19 DIAGNOSIS — Z8679 Personal history of other diseases of the circulatory system: Secondary | ICD-10-CM | POA: Diagnosis not present

## 2015-08-19 DIAGNOSIS — R1084 Generalized abdominal pain: Secondary | ICD-10-CM | POA: Insufficient documentation

## 2015-08-19 DIAGNOSIS — R112 Nausea with vomiting, unspecified: Secondary | ICD-10-CM | POA: Diagnosis not present

## 2015-08-19 DIAGNOSIS — J45909 Unspecified asthma, uncomplicated: Secondary | ICD-10-CM | POA: Insufficient documentation

## 2015-08-19 DIAGNOSIS — Z3202 Encounter for pregnancy test, result negative: Secondary | ICD-10-CM | POA: Insufficient documentation

## 2015-08-19 DIAGNOSIS — Z791 Long term (current) use of non-steroidal anti-inflammatories (NSAID): Secondary | ICD-10-CM | POA: Insufficient documentation

## 2015-08-19 DIAGNOSIS — Z79899 Other long term (current) drug therapy: Secondary | ICD-10-CM | POA: Insufficient documentation

## 2015-08-19 DIAGNOSIS — M1990 Unspecified osteoarthritis, unspecified site: Secondary | ICD-10-CM | POA: Diagnosis not present

## 2015-08-19 DIAGNOSIS — R197 Diarrhea, unspecified: Secondary | ICD-10-CM | POA: Insufficient documentation

## 2015-08-19 DIAGNOSIS — Z8639 Personal history of other endocrine, nutritional and metabolic disease: Secondary | ICD-10-CM | POA: Insufficient documentation

## 2015-08-19 DIAGNOSIS — R109 Unspecified abdominal pain: Secondary | ICD-10-CM | POA: Diagnosis present

## 2015-08-19 LAB — COMPREHENSIVE METABOLIC PANEL
ALT: 33 U/L (ref 14–54)
AST: 25 U/L (ref 15–41)
Albumin: 3.8 g/dL (ref 3.5–5.0)
Alkaline Phosphatase: 43 U/L (ref 38–126)
Anion gap: 9 (ref 5–15)
BUN: 6 mg/dL (ref 6–20)
CO2: 22 mmol/L (ref 22–32)
Calcium: 8.6 mg/dL — ABNORMAL LOW (ref 8.9–10.3)
Chloride: 107 mmol/L (ref 101–111)
Creatinine, Ser: 0.48 mg/dL (ref 0.44–1.00)
GFR calc Af Amer: >60 mL/min (ref >60)
GFR calc non Af Amer: >60 mL/min (ref >60)
Glucose, Bld: 96 mg/dL (ref 65–99)
Potassium: 3.5 mmol/L (ref 3.5–5.1)
Sodium: 138 mmol/L (ref 135–145)
Total Bilirubin: 0.4 mg/dL (ref 0.3–1.2)
Total Protein: 6.5 g/dL (ref 6.5–8.1)

## 2015-08-19 LAB — CBC
HCT: 37.1 % (ref 36.0–46.0)
Hemoglobin: 12.2 g/dL (ref 12.0–15.0)
MCH: 30.5 pg (ref 26.0–34.0)
MCHC: 32.9 g/dL (ref 30.0–36.0)
MCV: 92.8 fL (ref 78.0–100.0)
Platelets: 333 10*3/uL (ref 150–400)
RBC: 4 MIL/uL (ref 3.87–5.11)
RDW: 12.7 % (ref 11.5–15.5)
WBC: 7.7 10*3/uL (ref 4.0–10.5)

## 2015-08-19 LAB — URINALYSIS, ROUTINE W REFLEX MICROSCOPIC
Bilirubin Urine: NEGATIVE
Glucose, UA: NEGATIVE mg/dL
Hgb urine dipstick: NEGATIVE
Leukocytes, UA: NEGATIVE
Nitrite: NEGATIVE
Protein, ur: NEGATIVE mg/dL
Specific Gravity, Urine: 1.01 (ref 1.005–1.030)
pH: 7 (ref 5.0–8.0)

## 2015-08-19 LAB — PREGNANCY, URINE: Preg Test, Ur: NEGATIVE

## 2015-08-19 LAB — LIPASE, BLOOD: Lipase: 29 U/L (ref 11–51)

## 2015-08-19 MED ORDER — FENTANYL CITRATE (PF) 100 MCG/2ML IJ SOLN
50.0000 ug | Freq: Once | INTRAMUSCULAR | Status: AC
  Administered 2015-08-19: 50 ug via INTRAVENOUS
  Filled 2015-08-19: qty 2

## 2015-08-19 MED ORDER — MORPHINE SULFATE (PF) 2 MG/ML IV SOLN
2.0000 mg | Freq: Once | INTRAVENOUS | Status: AC
  Administered 2015-08-19: 2 mg via INTRAVENOUS
  Filled 2015-08-19: qty 1

## 2015-08-19 MED ORDER — IOHEXOL 300 MG/ML  SOLN
100.0000 mL | Freq: Once | INTRAMUSCULAR | Status: AC | PRN
  Administered 2015-08-19: 100 mL via INTRAVENOUS

## 2015-08-19 MED ORDER — IOHEXOL 300 MG/ML  SOLN
50.0000 mL | Freq: Once | INTRAMUSCULAR | Status: AC | PRN
  Administered 2015-08-19: 50 mL via ORAL

## 2015-08-19 MED ORDER — SODIUM CHLORIDE 0.9 % IV BOLUS (SEPSIS)
1000.0000 mL | Freq: Once | INTRAVENOUS | Status: AC
  Administered 2015-08-19: 1000 mL via INTRAVENOUS

## 2015-08-19 MED ORDER — SODIUM CHLORIDE 0.9 % IV SOLN
INTRAVENOUS | Status: DC
  Administered 2015-08-19: 20:00:00 via INTRAVENOUS

## 2015-08-19 MED ORDER — TRAMADOL HCL 50 MG PO TABS: 50.0000 mg | ORAL_TABLET | Freq: Four times a day (QID) | ORAL | Status: DC | PRN

## 2015-08-19 MED ORDER — ONDANSETRON HCL 4 MG/2ML IJ SOLN
4.0000 mg | Freq: Once | INTRAMUSCULAR | Status: AC
  Administered 2015-08-19: 4 mg via INTRAVENOUS
  Filled 2015-08-19: qty 2

## 2015-08-19 NOTE — ED Provider Notes (Addendum)
CSN: [id number]     Arrival date & time 08/19/15  1859 History   First MD Initiated Contact with Patient 08/19/15 1907     Chief Complaint  Patient presents with  . Abdominal Pain     (Consider location/radiation/quality/duration/timing/severity/associated sxs/prior Treatment) Patient is a 24 y.o. female presenting with abdominal pain. The history is provided by the patient.  Abdominal Pain Associated symptoms: diarrhea, nausea and vomiting   Associated symptoms: no chest pain, no dysuria, no fever and no shortness of breath    patient with the nausea vomiting and diarrhea on Sunday and Monday. Then that resolved. Was associated with abdominal cramping at that time. Here to day restarted with some abdominal cramping. Patient is concerned that she is constipated. No further vomiting or diarrhea did take a laxative to have a bowel movement. With abdominal cramping has continued. Patient by history no blood in the bowel movements no blood in the vomit. Denies any fevers.  Past Medical History  Diagnosis Date  . Asthma   . Raynauds syndrome   . Raynauds syndrome 2015  . Fibromyalgia   . Arthritis   . PCOS (polycystic ovarian syndrome) 2015  . Raynaud disease   . Ectopic fetus    Past Surgical History  Procedure Laterality Date  . Cyst removed from throat    . Thyroidectomy, partial  2010   Family History  Problem Relation Age of Onset  . Lupus Maternal Grandmother   . Cancer Maternal Grandmother   . Cancer Paternal Grandfather    Social History  Substance Use Topics  . Smoking status: Never Smoker   . Smokeless tobacco: None  . Alcohol Use: No   OB History    Gravida Para Term Preterm AB TAB SAB Ectopic Multiple Living   1         0     Review of Systems  Constitutional: Negative for fever.  HENT: Negative for congestion.   Eyes: Negative for visual disturbance.  Respiratory: Negative for shortness of breath.   Cardiovascular: Negative for chest pain.   Gastrointestinal: Positive for nausea, vomiting, abdominal pain and diarrhea.  Genitourinary: Negative for dysuria.  Musculoskeletal: Negative for back pain.  Skin: Negative for rash.  Neurological: Negative for headaches.  Hematological: Does not bruise/bleed easily.  Psychiatric/Behavioral: Negative for confusion.      Allergies  Norvasc  Home Medications   Prior to Admission medications   Medication Sig Start Date End Date Taking? Authorizing Provider  hydroxychloroquine (PLAQUENIL) 200 MG tablet Take 200 mg by mouth 2 (two) times daily. 10/13/14   Historical Provider, MD  ibuprofen (ADVIL,MOTRIN) 600 MG tablet Take 1 tablet (600 mg total) by mouth 3 (three) times daily after meals. 04/25/15   Loren Racer, MD  naproxen (NAPROSYN) 250 MG tablet Take 1 tablet (250 mg total) by mouth 2 (two) times daily as needed for mild pain or moderate pain (take with food). 12/15/14   Samuel Jester, DO  ondansetron (ZOFRAN) 4 MG tablet Take 1 tablet (4 mg total) by mouth every 8 (eight) hours as needed for nausea or vomiting. 12/15/14   Samuel Jester, DO  oxyCODONE-acetaminophen (PERCOCET/ROXICET) 5-325 MG per tablet 1 or 2 tabs PO q6h prn pain 12/15/14   Samuel Jester, DO  tamsulosin (FLOMAX) 0.4 MG CAPS capsule Take 1 capsule (0.4 mg total) by mouth at bedtime. 12/15/14   Samuel Jester, DO  traMADol (ULTRAM) 50 MG tablet Take 1 tablet (50 mg total) by mouth every 6 (six) hours as needed.  08/19/15   Vanetta MuldersScott Shawndell Schillaci, MD   BP 134/92 mmHg  Pulse 100  Temp(Src) 97.8 F (36.6 C) (Oral)  Resp 16  Ht 5' (1.524 m)  Wt 75.297 kg  BMI 32.42 kg/m2  SpO2 100%  LMP 08/03/2015 Physical Exam  Constitutional: She is oriented to person, place, and time. She appears well-developed and well-nourished. No distress.  HENT:  Head: Normocephalic and atraumatic.  Mouth/Throat: Oropharynx is clear and moist.  Eyes: Conjunctivae and EOM are normal. Pupils are equal, round, and reactive to light.   Neck: Normal range of motion. Neck supple.  Cardiovascular: Normal rate, regular rhythm and normal heart sounds.   No murmur heard. Pulmonary/Chest: Effort normal and breath sounds normal. No respiratory distress.  Abdominal: Soft. Bowel sounds are normal. She exhibits no distension.  Musculoskeletal: Normal range of motion.  Neurological: She is alert and oriented to person, place, and time. No cranial nerve deficit.  Skin: Skin is warm. No rash noted.  Nursing note and vitals reviewed.   ED Course  Procedures (including critical care time) Labs Review Labs Reviewed  COMPREHENSIVE METABOLIC PANEL - Abnormal; Notable for the following:    Calcium 8.6 (*)    All other components within normal limits  URINALYSIS, ROUTINE W REFLEX MICROSCOPIC (NOT AT [facility]) - Abnormal; Notable for the following:    APPearance HAZY (*)    Ketones, ur TRACE (*)    All other components within normal limits  LIPASE, BLOOD  CBC  PREGNANCY, URINE  I-STAT BETA HCG BLOOD, ED ([facility], [facility], [facility] ONLY)   Results for orders placed or performed during the hospital encounter of 08/19/15  Lipase, blood  Result Value Ref Range   Lipase 29 11 - 51 U/L  Comprehensive metabolic panel  Result Value Ref Range   Sodium 138 135 - 145 mmol/L   Potassium 3.5 3.5 - 5.1 mmol/L   Chloride 107 101 - 111 mmol/L   CO2 22 22 - 32 mmol/L   Glucose, Bld 96 65 - 99 mg/dL   BUN 6 6 - 20 mg/dL   Creatinine, Ser 6.570.48 0.44 - 1.00 mg/dL   Calcium 8.6 (L) 8.9 - 10.3 mg/dL   Total Protein 6.5 6.5 - 8.1 g/dL   Albumin 3.8 3.5 - 5.0 g/dL   AST 25 15 - 41 U/L   ALT 33 14 - 54 U/L   Alkaline Phosphatase 43 38 - 126 U/L   Total Bilirubin 0.4 0.3 - 1.2 mg/dL   GFR calc non Af Amer >60 >60 mL/min   GFR calc Af Amer >60 >60 mL/min   Anion gap 9 5 - 15  CBC  Result Value Ref Range   WBC 7.7 4.0 - 10.5 K/uL   RBC 4.00 3.87 - 5.11 MIL/uL   Hemoglobin 12.2 12.0 - 15.0 g/dL   HCT 84.637.1 96.236.0 - 95.246.0 %   MCV 92.8 78.0 - 100.0 fL   MCH 30.5  26.0 - 34.0 pg   MCHC 32.9 30.0 - 36.0 g/dL   RDW 84.112.7 32.411.5 - 40.115.5 %   Platelets 333 150 - 400 K/uL  Urinalysis, Routine w reflex microscopic (not at [facility])  Result Value Ref Range   Color, Urine YELLOW YELLOW   APPearance HAZY (A) CLEAR   Specific Gravity, Urine 1.010 1.005 - 1.030   pH 7.0 5.0 - 8.0   Glucose, UA NEGATIVE NEGATIVE mg/dL   Hgb urine dipstick NEGATIVE NEGATIVE   Bilirubin Urine NEGATIVE NEGATIVE   Ketones, ur TRACE (A) NEGATIVE mg/dL  Protein, ur NEGATIVE NEGATIVE mg/dL   Nitrite NEGATIVE NEGATIVE   Leukocytes, UA NEGATIVE NEGATIVE  Pregnancy, urine  Result Value Ref Range   Preg Test, Ur NEGATIVE NEGATIVE     Imaging Review Ct Abdomen Pelvis W Contrast  08/19/2015  CLINICAL DATA:  Abdominal pain and nausea. EXAM: CT ABDOMEN AND PELVIS WITH CONTRAST TECHNIQUE: Multidetector CT imaging of the abdomen and pelvis was performed using the standard protocol following bolus administration of intravenous contrast. CONTRAST:  50mL OMNIPAQUE IOHEXOL 300 MG/ML SOLN, OMNIPAQUE IOHEXOL 300 MG/ML SOLN COMPARISON:  12/14/2014 FINDINGS: Lower chest: No pleural or pericardial effusion. The lung bases appear clear. Hepatobiliary: No suspicious liver abnormalities identified. The gallbladder appears normal. There is no biliary dilatation. Pancreas: Negative Spleen: Negative Adrenals/Urinary Tract: Normal adrenal glands. Bilateral pelvocaliectasis is identified. No obstructing stone or mass noted. The urinary bladder appears normal. Stomach/Bowel: The stomach is normal. The small bowel loops are normal. The appendix is visualized and appears normal. Unremarkable appearance of the colon. Vascular/Lymphatic: Normal appearance of the abdominal aorta. No enlarged retroperitoneal or mesenteric adenopathy. No enlarged pelvic or inguinal lymph nodes. Reproductive: The uterus and the adnexal structures have a normal appearance. Other: No free fluid or fluid collections identified within the  abdomen or pelvis. Musculoskeletal: No acute bone abnormality. IMPRESSION: 1. No acute findings identified within the abdomen or pelvis. 2. Bilateral pelvocaliectasis without obstructing stone identified. Electronically Signed   By: Signa Kell M.D.   On: 08/19/2015 21:54   I have personally reviewed and evaluated these images and lab results as part of my medical decision-making.   EKG Interpretation None      MDM   Final diagnoses:  Generalized abdominal pain    Patient over the weekend and early first part of the week with symptoms consistent with a viral gastroenteritis on Sunday he had abdominal cramps nausea Sunday and Monday had some diarrhea and vomiting. That resolved. Patient now feels as if she's constipated still having some abdominal cramping. No blood in the bowel movements. Sort of generalized abdominal pain.  Patient's CBC without evidence of any leukocytosis.  Labs to include liver function test and lipase without evidence of any acute abnormalities. No evidence of pancreatitis. Urinalysis is negative pregnancy test is negative no evidence of urinary tract infection.  CT scan of the abdomen of has no acute findings patient can be discharged home if it shows evidence of constipation which was the patient's main concern she could be treated with the Mira lax.  CT scan is negative. Most likely symptoms are residual from the gastroenteritis symptoms from Sunday and Monday. Patient stable for discharge home.   Vanetta Mulders, MD 08/19/15 1610  Vanetta Mulders, MD 08/19/15 2201

## 2015-08-19 NOTE — Discharge Instructions (Signed)
Workup for the abdominal pain without any acute findings. Labs are normal CT scan unremarkable. No evidence of constipation. Takes tramadol as needed for pain. Probably just residual symptoms from the stomach flu that she had earlier in the week. Return for any new or worse symptoms. Make an appointment follow-up with your regular doctor if you don't improve in 2 days.

## 2015-08-19 NOTE — ED Notes (Signed)
Pt c/o abdominal pain and nausea since Sunday. Pt denies any urinary symptoms. Reports 1 episode of vomiting/diarrhea Sunday. Pt now states she is constipated.

## 2015-09-11 ENCOUNTER — Inpatient Hospital Stay (HOSPITAL_COMMUNITY): Payer: BLUE CROSS/BLUE SHIELD

## 2015-09-11 ENCOUNTER — Ambulatory Visit (HOSPITAL_COMMUNITY)
Admission: EM | Admit: 2015-09-11 | Discharge: 2015-09-12 | Disposition: A | Discharge status: Home / Self Care | Payer: BLUE CROSS/BLUE SHIELD | Source: Ambulatory Visit | Attending: Obstetrics and Gynecology | Admitting: Obstetrics and Gynecology

## 2015-09-11 ENCOUNTER — Inpatient Hospital Stay (HOSPITAL_COMMUNITY): Payer: BLUE CROSS/BLUE SHIELD | Admitting: Anesthesiology

## 2015-09-11 ENCOUNTER — Encounter (HOSPITAL_COMMUNITY)
Admission: EM | Disposition: A | Discharge status: Home / Self Care | Payer: Self-pay | Source: Ambulatory Visit | Attending: Obstetrics and Gynecology

## 2015-09-11 ENCOUNTER — Encounter (HOSPITAL_COMMUNITY): Payer: Self-pay

## 2015-09-11 DIAGNOSIS — R109 Unspecified abdominal pain: Secondary | ICD-10-CM

## 2015-09-11 DIAGNOSIS — O00109 Unspecified tubal pregnancy without intrauterine pregnancy: Secondary | ICD-10-CM | POA: Diagnosis present

## 2015-09-11 DIAGNOSIS — Z23 Encounter for immunization: Secondary | ICD-10-CM | POA: Insufficient documentation

## 2015-09-11 DIAGNOSIS — O001 Tubal pregnancy without intrauterine pregnancy: Secondary | ICD-10-CM | POA: Insufficient documentation

## 2015-09-11 DIAGNOSIS — O26899 Other specified pregnancy related conditions, unspecified trimester: Secondary | ICD-10-CM

## 2015-09-11 DIAGNOSIS — O4690 Antepartum hemorrhage, unspecified, unspecified trimester: Secondary | ICD-10-CM

## 2015-09-11 HISTORY — PX: LAPAROSCOPY: SHX197

## 2015-09-11 LAB — CBC
HCT: 37.4 % (ref 36.0–46.0)
HCT: 31.2 % — ABNORMAL LOW (ref 36.0–46.0)
Hemoglobin: 12.4 g/dL (ref 12.0–15.0)
Hemoglobin: 10.3 g/dL — ABNORMAL LOW (ref 12.0–15.0)
MCH: 30.6 pg (ref 26.0–34.0)
MCH: 30.6 pg (ref 26.0–34.0)
MCHC: 33.2 g/dL (ref 30.0–36.0)
MCHC: 33 g/dL (ref 30.0–36.0)
MCV: 92.3 fL (ref 78.0–100.0)
MCV: 92.6 fL (ref 78.0–100.0)
Platelets: 298 10*3/uL (ref 150–400)
Platelets: 244 10*3/uL (ref 150–400)
RBC: 4.05 MIL/uL (ref 3.87–5.11)
RBC: 3.37 MIL/uL — ABNORMAL LOW (ref 3.87–5.11)
RDW: 13 % (ref 11.5–15.5)
RDW: 13 % (ref 11.5–15.5)
WBC: 12.2 10*3/uL — ABNORMAL HIGH (ref 4.0–10.5)
WBC: 15.9 10*3/uL — ABNORMAL HIGH (ref 4.0–10.5)

## 2015-09-11 LAB — WET PREP, GENITAL
Sperm: NONE SEEN
Trich, Wet Prep: NONE SEEN
Yeast Wet Prep HPF POC: NONE SEEN

## 2015-09-11 LAB — URINALYSIS, ROUTINE W REFLEX MICROSCOPIC
Bilirubin Urine: NEGATIVE
Glucose, UA: NEGATIVE mg/dL
Ketones, ur: >80 mg/dL — AB
Nitrite: NEGATIVE
Protein, ur: NEGATIVE mg/dL
Specific Gravity, Urine: >1.03 — ABNORMAL HIGH (ref 1.005–1.030)
pH: 5.5 (ref 5.0–8.0)

## 2015-09-11 LAB — TYPE AND SCREEN
ABO/RH(D): A NEG
Antibody Screen: NEGATIVE

## 2015-09-11 LAB — URINE MICROSCOPIC-ADD ON

## 2015-09-11 LAB — POCT PREGNANCY, URINE: Preg Test, Ur: POSITIVE — AB

## 2015-09-11 LAB — HCG, QUANTITATIVE, PREGNANCY: hCG, Beta Chain, Quant, S: 679 m[IU]/mL — ABNORMAL HIGH (ref <5)

## 2015-09-11 SURGERY — Surgical Case
Anesthesia: *Unknown

## 2015-09-11 SURGERY — LAPAROSCOPY OPERATIVE
Anesthesia: General | Site: Abdomen

## 2015-09-11 MED ORDER — PHENYLEPHRINE HCL 10 MG/ML IJ SOLN
INTRAMUSCULAR | Status: DC | PRN
  Administered 2015-09-11 (×2): 80 ug via INTRAVENOUS
  Administered 2015-09-11: 40 ug via INTRAVENOUS
  Administered 2015-09-11: 80 ug via INTRAVENOUS

## 2015-09-11 MED ORDER — METOCLOPRAMIDE HCL 5 MG/ML IJ SOLN
INTRAMUSCULAR | Status: DC | PRN
  Administered 2015-09-11 (×2): 5 mg via INTRAVENOUS

## 2015-09-11 MED ORDER — OXYCODONE-ACETAMINOPHEN 5-325 MG PO TABS: 1.0000 | ORAL_TABLET | ORAL | Status: DC | PRN

## 2015-09-11 MED ORDER — ONDANSETRON HCL 4 MG PO TABS: 4.0000 mg | ORAL_TABLET | Freq: Four times a day (QID) | ORAL | Status: DC | PRN

## 2015-09-11 MED ORDER — LACTATED RINGERS IV SOLN
INTRAVENOUS | Status: DC
  Administered 2015-09-11 – 2015-09-12 (×2): via INTRAVENOUS

## 2015-09-11 MED ORDER — BUPIVACAINE HCL (PF) 0.25 % IJ SOLN
INTRAMUSCULAR | Status: DC | PRN
  Administered 2015-09-11: 10 mL

## 2015-09-11 MED ORDER — FENTANYL CITRATE (PF) 250 MCG/5ML IJ SOLN
INTRAMUSCULAR | Status: AC
  Filled 2015-09-11: qty 5

## 2015-09-11 MED ORDER — GLYCOPYRROLATE 0.2 MG/ML IJ SOLN
INTRAMUSCULAR | Status: DC | PRN
  Administered 2015-09-11: 0.1 mg via INTRAVENOUS
  Administered 2015-09-11: 0.6 mg via INTRAVENOUS

## 2015-09-11 MED ORDER — RHO D IMMUNE GLOBULIN 1500 UNIT/2ML IJ SOSY
300.0000 ug | PREFILLED_SYRINGE | Freq: Once | INTRAMUSCULAR | Status: AC
  Administered 2015-09-11: 300 ug via INTRAMUSCULAR
  Filled 2015-09-11: qty 2

## 2015-09-11 MED ORDER — DEXAMETHASONE SODIUM PHOSPHATE 10 MG/ML IJ SOLN
INTRAMUSCULAR | Status: AC
  Filled 2015-09-11: qty 1

## 2015-09-11 MED ORDER — HYDROMORPHONE HCL 1 MG/ML IJ SOLN
INTRAMUSCULAR | Status: AC
  Filled 2015-09-11: qty 1

## 2015-09-11 MED ORDER — ONDANSETRON HCL 4 MG/2ML IJ SOLN
4.0000 mg | Freq: Four times a day (QID) | INTRAMUSCULAR | Status: DC | PRN
  Administered 2015-09-11: 4 mg via INTRAVENOUS
  Filled 2015-09-11: qty 2

## 2015-09-11 MED ORDER — ROCURONIUM BROMIDE 100 MG/10ML IV SOLN
INTRAVENOUS | Status: AC
  Filled 2015-09-11: qty 1

## 2015-09-11 MED ORDER — SCOPOLAMINE 1 MG/3DAYS TD PT72
MEDICATED_PATCH | TRANSDERMAL | Status: DC | PRN
  Administered 2015-09-11: 1 via TRANSDERMAL

## 2015-09-11 MED ORDER — MIDAZOLAM HCL 2 MG/2ML IJ SOLN
INTRAMUSCULAR | Status: DC | PRN
Start: 2015-09-11 — End: 2015-09-11
  Administered 2015-09-11: 2 mg via INTRAVENOUS

## 2015-09-11 MED ORDER — SCOPOLAMINE 1 MG/3DAYS TD PT72
MEDICATED_PATCH | TRANSDERMAL | Status: AC
  Filled 2015-09-11: qty 1

## 2015-09-11 MED ORDER — DEXAMETHASONE SODIUM PHOSPHATE 4 MG/ML IJ SOLN
INTRAMUSCULAR | Status: DC | PRN
  Administered 2015-09-11: 4 mg via INTRAVENOUS

## 2015-09-11 MED ORDER — PROPOFOL 10 MG/ML IV BOLUS
INTRAVENOUS | Status: DC | PRN
  Administered 2015-09-11: 160 mg via INTRAVENOUS

## 2015-09-11 MED ORDER — PROMETHAZINE HCL 25 MG/ML IJ SOLN
6.2500 mg | INTRAMUSCULAR | Status: DC | PRN
  Administered 2015-09-11: 6.25 mg via INTRAVENOUS

## 2015-09-11 MED ORDER — PROMETHAZINE HCL 25 MG/ML IJ SOLN
INTRAMUSCULAR | Status: AC
  Administered 2015-09-11: 6.25 mg via INTRAVENOUS
  Filled 2015-09-11: qty 1

## 2015-09-11 MED ORDER — IBUPROFEN 600 MG PO TABS
600.0000 mg | ORAL_TABLET | Freq: Four times a day (QID) | ORAL | Status: DC | PRN
  Administered 2015-09-11 – 2015-09-12 (×2): 600 mg via ORAL
  Filled 2015-09-11 (×2): qty 1

## 2015-09-11 MED ORDER — ONDANSETRON HCL 4 MG/2ML IJ SOLN
INTRAMUSCULAR | Status: AC
  Filled 2015-09-11: qty 2

## 2015-09-11 MED ORDER — HYDROMORPHONE HCL 1 MG/ML IJ SOLN: 0.2500 mg | INTRAMUSCULAR | Status: DC | PRN

## 2015-09-11 MED ORDER — LACTATED RINGERS IR SOLN
Status: DC | PRN
  Administered 2015-09-11: 3000 mL

## 2015-09-11 MED ORDER — LIDOCAINE HCL (CARDIAC) 20 MG/ML IV SOLN
INTRAVENOUS | Status: AC
  Filled 2015-09-11: qty 5

## 2015-09-11 MED ORDER — LACTATED RINGERS IV SOLN
INTRAVENOUS | Status: DC | PRN
  Administered 2015-09-11 (×4): via INTRAVENOUS

## 2015-09-11 MED ORDER — FAMOTIDINE IN NACL 20-0.9 MG/50ML-% IV SOLN
20.0000 mg | Freq: Once | INTRAVENOUS | Status: AC
  Administered 2015-09-11: 20 mg via INTRAVENOUS
  Filled 2015-09-11: qty 50

## 2015-09-11 MED ORDER — ROCURONIUM BROMIDE 100 MG/10ML IV SOLN
INTRAVENOUS | Status: DC | PRN
  Administered 2015-09-11: 25 mg via INTRAVENOUS
  Administered 2015-09-11: 10 mg via INTRAVENOUS
  Administered 2015-09-11: 5 mg via INTRAVENOUS

## 2015-09-11 MED ORDER — METOCLOPRAMIDE HCL 5 MG/ML IJ SOLN
INTRAMUSCULAR | Status: AC
  Filled 2015-09-11: qty 2

## 2015-09-11 MED ORDER — HYDROMORPHONE HCL 1 MG/ML IJ SOLN
INTRAMUSCULAR | Status: DC | PRN
  Administered 2015-09-11: 1 mg via INTRAVENOUS
  Administered 2015-09-11: 0.5 mg via INTRAVENOUS

## 2015-09-11 MED ORDER — LACTATED RINGERS IV SOLN: INTRAVENOUS | Status: DC

## 2015-09-11 MED ORDER — LIDOCAINE HCL (CARDIAC) 20 MG/ML IV SOLN
INTRAVENOUS | Status: DC | PRN
  Administered 2015-09-11: 80 mg via INTRAVENOUS

## 2015-09-11 MED ORDER — ONDANSETRON HCL 4 MG/2ML IJ SOLN
INTRAMUSCULAR | Status: DC | PRN
  Administered 2015-09-11: 4 mg via INTRAVENOUS

## 2015-09-11 MED ORDER — IBUPROFEN 600 MG PO TABS: 600.0000 mg | ORAL_TABLET | Freq: Four times a day (QID) | ORAL | Status: DC | PRN

## 2015-09-11 MED ORDER — OXYCODONE-ACETAMINOPHEN 5-325 MG PO TABS
1.0000 | ORAL_TABLET | ORAL | Status: DC | PRN
  Administered 2015-09-11: 1 via ORAL
  Administered 2015-09-12 (×2): 2 via ORAL
  Filled 2015-09-11 (×2): qty 2
  Filled 2015-09-11: qty 1

## 2015-09-11 MED ORDER — PHENYLEPHRINE 40 MCG/ML (10ML) SYRINGE FOR IV PUSH (FOR BLOOD PRESSURE SUPPORT)
PREFILLED_SYRINGE | INTRAVENOUS | Status: AC
  Filled 2015-09-11: qty 20

## 2015-09-11 MED ORDER — HYDROMORPHONE HCL 1 MG/ML IJ SOLN
1.0000 mg | Freq: Once | INTRAMUSCULAR | Status: AC
  Administered 2015-09-11: 1 mg via INTRAVENOUS
  Filled 2015-09-11: qty 1

## 2015-09-11 MED ORDER — SUCCINYLCHOLINE CHLORIDE 20 MG/ML IJ SOLN
INTRAMUSCULAR | Status: AC
  Filled 2015-09-11: qty 1

## 2015-09-11 MED ORDER — NEOSTIGMINE METHYLSULFATE 10 MG/10ML IV SOLN
INTRAVENOUS | Status: DC | PRN
  Administered 2015-09-11: 3 mg via INTRAVENOUS

## 2015-09-11 MED ORDER — NEOSTIGMINE METHYLSULFATE 10 MG/10ML IV SOLN
INTRAVENOUS | Status: AC
  Filled 2015-09-11: qty 1

## 2015-09-11 MED ORDER — SUCCINYLCHOLINE CHLORIDE 20 MG/ML IJ SOLN
INTRAMUSCULAR | Status: DC | PRN
  Administered 2015-09-11: 120 mg via INTRAVENOUS

## 2015-09-11 MED ORDER — CITRIC ACID-SODIUM CITRATE 334-500 MG/5ML PO SOLN
30.0000 mL | Freq: Once | ORAL | Status: AC
  Administered 2015-09-11: 30 mL via ORAL
  Filled 2015-09-11: qty 15

## 2015-09-11 MED ORDER — MIDAZOLAM HCL 2 MG/2ML IJ SOLN
INTRAMUSCULAR | Status: AC
  Filled 2015-09-11: qty 2

## 2015-09-11 MED ORDER — FENTANYL CITRATE (PF) 250 MCG/5ML IJ SOLN
INTRAMUSCULAR | Status: DC | PRN
  Administered 2015-09-11 (×2): 100 ug via INTRAVENOUS
  Administered 2015-09-11: 50 ug via INTRAVENOUS

## 2015-09-11 MED ORDER — PROPOFOL 10 MG/ML IV BOLUS
INTRAVENOUS | Status: AC
  Filled 2015-09-11: qty 20

## 2015-09-11 SURGICAL SUPPLY — 27 items
CANISTER SUCT 3000ML (MISCELLANEOUS) ×2 IMPLANT
CLOTH BEACON ORANGE TIMEOUT ST (SAFETY) ×2 IMPLANT
CONT PATH 16OZ SNAP LID 3702 (MISCELLANEOUS) IMPLANT
DECANTER SPIKE VIAL GLASS SM (MISCELLANEOUS) ×2 IMPLANT
DRSG COVADERM PLUS 2X2 (GAUZE/BANDAGES/DRESSINGS) ×4 IMPLANT
DRSG OPSITE POSTOP 3X4 (GAUZE/BANDAGES/DRESSINGS) ×2 IMPLANT
GLOVE BIO SURGEON STRL SZ7 (GLOVE) ×2 IMPLANT
GLOVE BIOGEL PI IND STRL 7.0 (GLOVE) ×1 IMPLANT
GLOVE BIOGEL PI INDICATOR 7.0 (GLOVE) ×1
LIQUID BAND (GAUZE/BANDAGES/DRESSINGS) ×2 IMPLANT
NEEDLE HYPO 22GX1.5 SAFETY (NEEDLE) IMPLANT
NEEDLE SPNL 22GX3.5 QUINCKE BK (NEEDLE) IMPLANT
NS IRRIG 1000ML POUR BTL (IV SOLUTION) IMPLANT
PACK LAPAROSCOPY BASIN (CUSTOM PROCEDURE TRAY) ×2 IMPLANT
PAD POSITIONING PINK XL (MISCELLANEOUS) ×2 IMPLANT
POUCH SPECIMEN RETRIEVAL 10MM (ENDOMECHANICALS) ×2 IMPLANT
SET IRRIG TUBING LAPAROSCOPIC (IRRIGATION / IRRIGATOR) ×2 IMPLANT
SLEEVE XCEL OPT CAN 5 100 (ENDOMECHANICALS) ×2 IMPLANT
SUT VIC AB 3-0 PS2 18 (SUTURE) ×1
SUT VIC AB 3-0 PS2 18XBRD (SUTURE) ×1 IMPLANT
SUT VICRYL 0 UR6 27IN ABS (SUTURE) ×4 IMPLANT
TOWEL OR 17X24 6PK STRL BLUE (TOWEL DISPOSABLE) ×4 IMPLANT
TROCAR BALLN 12MMX100 BLUNT (TROCAR) ×2 IMPLANT
TROCAR OPTI TIP 5M 100M (ENDOMECHANICALS) ×2 IMPLANT
TROCAR XCEL DIL TIP R 11M (ENDOMECHANICALS) ×2 IMPLANT
WARMER LAPAROSCOPE (MISCELLANEOUS) ×2 IMPLANT
WATER STERILE IRR 1000ML POUR (IV SOLUTION) IMPLANT

## 2015-09-11 NOTE — Transfer of Care (Signed)
Immediate Anesthesia Transfer of Care Note  Patient: Jamie Waters  Procedure(s) Performed: Procedure(s): LAPAROSCOPY OPERATIVE, Right Salpingectomy with Removal Ectopic Pregnancy (N/A)  Patient Location: PACU  Anesthesia Type:General  Level of Consciousness: awake  Airway & Oxygen Therapy: Patient Spontanous Breathing and Patient connected to nasal cannula oxygen  Post-op Assessment: Report given to RN and Post -op Vital signs reviewed and stable  Post vital signs: stable  Last Vitals:  Filed Vitals:   09/11/15 1224  BP: 126/75  Pulse: 116  Temp: 36.7 C  Resp: 18    Complications: No apparent anesthesia complications

## 2015-09-11 NOTE — Progress Notes (Signed)
  Pt with some persistent nausea and hypotension. Will keep overnight and d/c home in am

## 2015-09-11 NOTE — Anesthesia Procedure Notes (Signed)
Procedure Name: Intubation Date/Time: 09/11/2015 5:27 PM Performed by: Graciela HusbandsFUSSELL, Bradin Mcadory O Pre-anesthesia Checklist: Patient identified, Timeout performed, Emergency Drugs available, Suction available and Patient being monitored Patient Re-evaluated:Patient Re-evaluated prior to inductionOxygen Delivery Method: Circle system utilized Preoxygenation: Pre-oxygenation with 100% oxygen Intubation Type: Rapid sequence, Cricoid Pressure applied and IV induction Laryngoscope Size: Mac and 3 Grade View: Grade I Tube type: Oral Tube size: 6.5 mm Number of attempts: 1 Airway Equipment and Method: Stylet and Patient positioned with wedge pillow Placement Confirmation: ETT inserted through vocal cords under direct vision,  positive ETCO2 and breath sounds checked- equal and bilateral Secured at: 21 cm Tube secured with: Tape Dental Injury: Teeth and Oropharynx as per pre-operative assessment

## 2015-09-11 NOTE — Anesthesia Preprocedure Evaluation (Signed)
Anesthesia Evaluation  Patient identified by MRN, date of birth, ID band Patient awake    Reviewed: Allergy & Precautions, H&P , Patient's Chart, lab work & pertinent test results  History of Anesthesia Complications Negative for: history of anesthetic complications  Airway Mallampati: II  TM Distance: >3 FB Neck ROM: full    Dental no notable dental hx.    Pulmonary asthma ,    Pulmonary exam normal breath sounds clear to auscultation       Cardiovascular + Peripheral Vascular Disease (pt has raynauds)  Normal cardiovascular exam Rhythm:regular Rate:Normal     Neuro/Psych  Neuromuscular disease    GI/Hepatic negative GI ROS, Neg liver ROS,   Endo/Other  negative endocrine ROS  Renal/GU negative Renal ROS     Musculoskeletal  (+) Arthritis , Fibromyalgia -  Abdominal   Peds  Hematology negative hematology ROS (+)   Anesthesia Other Findings Ruptured ectopic pregnancy, takes plaquenil  Reproductive/Obstetrics (+) Pregnancy PCOS                             Anesthesia Physical Anesthesia Plan  ASA: III  Anesthesia Plan: General   Post-op Pain Management:    Induction: Intravenous and Rapid sequence  Airway Management Planned: Oral ETT  Additional Equipment:   Intra-op Plan:   Post-operative Plan: Extubation in OR  Informed Consent: I have reviewed the patients History and Physical, chart, labs and discussed the procedure including the risks, benefits and alternatives for the proposed anesthesia with the patient or authorized representative who has indicated his/her understanding and acceptance.   Dental Advisory Given  Plan Discussed with: Anesthesiologist, CRNA and Surgeon  Anesthesia Plan Comments:         Anesthesia Quick Evaluation

## 2015-09-11 NOTE — Op Note (Signed)
Pre-Operative Diagnosis: 1) right ectopic pregnancy 2) hemoperitoneum Postoperative Diagnosis: same Procedure: laparoscopic right salpingectomy Surgeon: Dr. Waynard Reeds Assistant:none Operative Findings:hemoperitoneum with dilated right fallopian tube consistent with ectopic pregnancy. Normal-appearing ovaries, uterus. No evidence of adhesive disease or endometriosis. Omentum, bowels, liver all normal-appearing. Specimen: right fallopian tube EBL:  200cc  Procedure: 25 year old gravida 2 para 0010 presented to return the admissions complaining of right-sided pelvic pain. She should be 5 weeks and 4 days by her last menstrual period. She has a history of a prior right ectopic that was treated with methotrexate in February 2016. Today she presented with progressive worsening of her right lower quadrant pain. In the emergency room her quantitative hCG was 620. This was an inappropriate rise from her quantitative hCG of 519 from 09/10/2015. Additionally, pelvic ultrasound demonstrated a right ectopic pregnancy with hemoperitoneum. Given the above findings the decision was made to proceed with surgical management of right ectopic pregnancy. Risks/benefits/alternatives of the procedure were discussed at length with the patient wished to proceed. Patient was brought to the operating room where she was placed in the dorsal lithotomy position in [place] [employer] stirrups. Gen. Anesthesia was administered and the abdomen and perineum were prepped and draped in normal sterile fashion. A speculum was placed in the vagina and a single-toothed tenaculum was placed on the anterior lip of the cervix and a Hulka uterine manipulator was placed. The single-toothed tenaculum and Speculum were removed. A Foley catheter was placed in the bladder with sterile technique. Attention was then turned to the abdominal portion of the case. 10 cc of quarter percent Marcaine were infiltrated into the infraumbilical skin. The skin was elevated with  an Allis clamp and a vertical skin incision was made inferior to the umbilicus. The subcutaneous tissue was dissected down to the level of the fascia. The fascia was grasped with Coker clamps 2, elevated, entered sharply with Mayo scissors. The peritoneum was identified by direct visualization and entered sharply with scissors. A pursestring suture was placed in the fascia and the [facility] port was placed. The abdomen was insufflated and the above findings were noted. A 10 mm trocar was placed in the left lower quadrant and a 5 mm trocar was placed in the right lower quadrant. The right fallopian tube was elevated and the 5 mm LigaSure was used to perform successive bites along the mesosalpinx of the right fallopian tube and the tube was excised. The surgical site was inspected and found to be hemostatic. The 10 mm endo-bag was deployed through the left lower quadrant port site and the right fallopian tube was placed in the Endobag under direct visualization. The Endobag was then removed through the left lower quadrant port site in addition to the trocar. The abdomen was reinspected, suction irrigated. The left lower quadrant port site was inspected and found to be hemostatic. The right lower quadrant trocar was removed under direct visualization and found to be hemostatic. The abdomen was desufflated and the [facility] port site removed. The pursestring in the umbilical fascia was tied down. The fascia in the left lower quadrant port site was closed with a Vicryl interrupted suture. The skin of the left lower quadrant port site and the umbilical port site were repaired with 3-0 Vicryl Rapide in a subcutaneous fashion. Skin glue was applied to both sites. The right lower quadrant port site was closed with surgical skin glue. The Hulka manipulator was removed from the cervix. This completed the surgical case. Anesthesia was reversed, the patient was extubated in  the operating room, and transferred to the PACU in stable  condition following the procedure

## 2015-09-11 NOTE — MAU Provider Note (Signed)
History     CSN: [id number]  Arrival date and time: 09/11/15 1207   First Provider Initiated Contact with Patient 09/11/15 1249      Chief Complaint  Patient presents with  . Abdominal Pain   HPI  DARIELLA GILLIHAN is a 25 y.o. G2P0 at [redacted]w[redacted]d. She presents with low pelvic pain, right side pain since this am. She started spotting when arrived at hosptial- is bright red, not really cramping, a dull pain. She had pain with voiding this am, no frequency or urgency. Her discharge is heavier white, no odor or itching. She called Dr Tenny Craw this am c/o constipation, took MOM which is now working for her. Her BHCG on Friday 1/6 was 520. She has hx R ectopic pregnancy tx with MTX 3/16 and hx kidney stones.  OB History    Gravida Para Term Preterm AB TAB SAB Ectopic Multiple Living   2         0      Past Medical History  Diagnosis Date  . Asthma   . Raynauds syndrome   . Raynauds syndrome 2015  . Fibromyalgia   . Arthritis   . PCOS (polycystic ovarian syndrome) 2015  . Raynaud disease   . Ectopic fetus   . Raynaud disease     Past Surgical History  Procedure Laterality Date  . Cyst removed from throat    . Thyroidectomy, partial  2010    Family History  Problem Relation Age of Onset  . Lupus Maternal Grandmother   . Cancer Maternal Grandmother   . Cancer Paternal Grandfather     Social History  Substance Use Topics  . Smoking status: Never Smoker   . Smokeless tobacco: None  . Alcohol Use: No    Allergies:  Allergies  Allergen Reactions  . Norvasc [Amlodipine Besylate] Shortness Of Breath and Other (See Comments)    Caused tightness in chest.    Prescriptions prior to admission  Medication Sig Dispense Refill Last Dose  . hydroxychloroquine (PLAQUENIL) 200 MG tablet Take 200 mg by mouth 2 (two) times daily.   12/13/2014 at Unknown time  . ibuprofen (ADVIL,MOTRIN) 600 MG tablet Take 1 tablet (600 mg total) by mouth 3 (three) times daily after meals. 30 tablet 0   .  naproxen (NAPROSYN) 250 MG tablet Take 1 tablet (250 mg total) by mouth 2 (two) times daily as needed for mild pain or moderate pain (take with food). 14 tablet 0   . ondansetron (ZOFRAN) 4 MG tablet Take 1 tablet (4 mg total) by mouth every 8 (eight) hours as needed for nausea or vomiting. 8 tablet 0   . oxyCODONE-acetaminophen (PERCOCET/ROXICET) 5-325 MG per tablet 1 or 2 tabs PO q6h prn pain 25 tablet 0   . tamsulosin (FLOMAX) 0.4 MG CAPS capsule Take 1 capsule (0.4 mg total) by mouth at bedtime. 5 capsule 0   . traMADol (ULTRAM) 50 MG tablet Take 1 tablet (50 mg total) by mouth every 6 (six) hours as needed. 15 tablet 0     Review of Systems  Constitutional: Negative for fever and chills.  Gastrointestinal: Positive for nausea, abdominal pain and constipation. Negative for vomiting.  Genitourinary: Positive for dysuria and flank pain. Negative for urgency and frequency.       Spotting   Physical Exam   Blood pressure 126/75, pulse 116, temperature 98.1 F (36.7 C), temperature source Oral, resp. rate 18, height 5' (1.524 m), weight 75.751 kg (167 lb), last menstrual period 08/03/2015.  Physical Exam  Nursing note and vitals reviewed. Constitutional: She is oriented to person, place, and time. She appears well-developed and well-nourished.  Cardiovascular: Normal rate.   Respiratory: Effort normal.  GI: Soft. She exhibits no distension and no mass. There is tenderness. There is no rebound and no guarding.  Genitourinary:  Pelvic exam: Ext gen- nl anatomy, skin intact Vagina- small amt bloody mucoid discharge Cx- closed, sl tender Uterus- nl size, sl tender Adn- Tender R>L, no mass palp  Musculoskeletal: Normal range of motion.  Neurological: She is alert and oriented to person, place, and time.  Skin: Skin is warm and dry.  Psychiatric: She has a normal mood and affect. Her behavior is normal.    MAU Course  Procedures  MDM Results for orders placed or performed during the  hospital encounter of 09/11/15 (from the past 24 hour(s))  Urinalysis, Routine w reflex microscopic ([facility])     Status: Abnormal   Collection Time: 09/11/15 12:20 PM  Result Value Ref Range   Color, Urine YELLOW YELLOW   APPearance CLEAR CLEAR   Specific Gravity, Urine >1.030 (H) 1.005 - 1.030   pH 5.5 5.0 - 8.0   Glucose, UA NEGATIVE NEGATIVE mg/dL   Hgb urine dipstick TRACE (A) NEGATIVE   Bilirubin Urine NEGATIVE NEGATIVE   Ketones, ur >80 (A) NEGATIVE mg/dL   Protein, ur NEGATIVE NEGATIVE mg/dL   Nitrite NEGATIVE NEGATIVE   Leukocytes, UA SMALL (A) NEGATIVE  Urine microscopic-add on     Status: Abnormal   Collection Time: 09/11/15 12:20 PM  Result Value Ref Range   Squamous Epithelial / LPF 0-5 (A) NONE SEEN   WBC, UA 6-30 0 - 5 WBC/hpf   RBC / HPF 0-5 0 - 5 RBC/hpf   Bacteria, UA RARE (A) NONE SEEN   Urine-Other MUCOUS PRESENT   Pregnancy, urine POC     Status: Abnormal   Collection Time: 09/11/15  1:00 PM  Result Value Ref Range   Preg Test, Ur POSITIVE (A) NEGATIVE  Wet prep, genital     Status: Abnormal   Collection Time: 09/11/15  1:30 PM  Result Value Ref Range   Yeast Wet Prep HPF POC NONE SEEN NONE SEEN   Trich, Wet Prep NONE SEEN NONE SEEN   Clue Cells Wet Prep HPF POC PRESENT (A) NONE SEEN   WBC, Wet Prep HPF POC MANY (A) NONE SEEN   Sperm NONE SEEN   hCG, quantitative, pregnancy     Status: Abnormal   Collection Time: 09/11/15  1:35 PM  Result Value Ref Range   hCG, Beta Chain, Quant, S 679 (H) <5 mIU/mL  CBC     Status: Abnormal   Collection Time: 09/11/15  1:35 PM  Result Value Ref Range   WBC 12.2 (H) 4.0 - 10.5 K/uL   RBC 4.05 3.87 - 5.11 MIL/uL   Hemoglobin 12.4 12.0 - 15.0 g/dL   HCT 16.137.4 09.636.0 - 04.546.0 %   MCV 92.3 78.0 - 100.0 fL   MCH 30.6 26.0 - 34.0 pg   MCHC 33.2 30.0 - 36.0 g/dL   RDW 40.913.0 81.111.5 - 91.415.5 %   Platelets 298 150 - 400 K/uL  Rh IG workup (includes ABO/Rh)     Status: None (Preliminary result)   Collection Time: 09/11/15  1:35  PM  Result Value Ref Range   Gestational Age(Wks) 5    ABO/RH(D) A NEG    Antibody Screen NEG    Unit Number [id number]  Blood Component Type RHIG    Unit division 00    Status of Unit ISSUED    Transfusion Status OK TO TRANSFUSE    US Ob Comp Less 14 Wks  09/11/2015  CLINICAL DATA:  Pain, bleeding. History of a previous right ectopic pregnancy. EXAM: OBSTETRIC <14 WK [place] AND TRANSVAGINAL OB US TECHNIQUE: Both transabdominal and transvaginal ultrasound examinations were performed for complete evaluation of the gestation as well as the maternal uterus, adnexal regions, and pelvic cul-de-sac. Transvaginal technique was performed to assess early pregnancy. COMPARISON:  None. FINDINGS: Intrauterine gestational sac: None.  Uterus is unremarkable. Maternal uterus/adnexae: Rounded mass is identified within the right adnexa, separate from the right ovary, measuring 3.3 x 2.4 x 2.9 cm, with hypoechoic focus centrally suggestive of a gestational sac of early pregnancy, highly suggestive of adnexal ectopic pregnancy. Small amount of complex fluid is seen within the adjacent right adnexa, suspicious for associated blood products. Additional simple-appearing free fluid in the cul-de-sac. Left ovary appears normal. No mass or free fluid identified in the left adnexal region. IMPRESSION: 1. Right adnexal mass measuring 3.3 x 2.9 cm, separate from the right ovary, highly suggestive of adnexal ectopic pregnancy. Small amount of complex free fluid in the adjacent right adnexa, suspicious for associated blood products. 2. No IUP seen. These results were called by telephone at the time of interpretation on 09/11/2015 at 2:40 pm to Dr. Eveline Keto , who verbally acknowledged these results. Electronically Signed   By: Bary Richard M.D.   On: 09/11/2015 14:40   US Ob Transvaginal  09/11/2015  CLINICAL DATA:  Pain, bleeding. History of a previous right ectopic pregnancy. EXAM: OBSTETRIC <14 WK [place] AND TRANSVAGINAL OB US  TECHNIQUE: Both transabdominal and transvaginal ultrasound examinations were performed for complete evaluation of the gestation as well as the maternal uterus, adnexal regions, and pelvic cul-de-sac. Transvaginal technique was performed to assess early pregnancy. COMPARISON:  None. FINDINGS: Intrauterine gestational sac: None.  Uterus is unremarkable. Maternal uterus/adnexae: Rounded mass is identified within the right adnexa, separate from the right ovary, measuring 3.3 x 2.4 x 2.9 cm, with hypoechoic focus centrally suggestive of a gestational sac of early pregnancy, highly suggestive of adnexal ectopic pregnancy. Small amount of complex fluid is seen within the adjacent right adnexa, suspicious for associated blood products. Additional simple-appearing free fluid in the cul-de-sac. Left ovary appears normal. No mass or free fluid identified in the left adnexal region. IMPRESSION: 1. Right adnexal mass measuring 3.3 x 2.9 cm, separate from the right ovary, highly suggestive of adnexal ectopic pregnancy. Small amount of complex free fluid in the adjacent right adnexa, suspicious for associated blood products. 2. No IUP seen. These results were called by telephone at the time of interpretation on 09/11/2015 at 2:40 pm to Dr. Eveline Keto , who verbally acknowledged these results. Electronically Signed   By: Bary Richard M.D.   On: 09/11/2015 14:40     BHCG was 520 on 1/6, is 679 today- inappropriate rise U/S-right adnexal mass 3.3x2.4x2.9 cm c/w early gestation with sma;; amt complex fluid in right adnaxe adjacent to mass, small amt free fluid in cul de sac Dr Tenny Craw notified, she will come see the pt  Assessment and Plan  R ectopic pregnancy Rhophylac to be given C&S on urine To OR   Chrystel Barefield M. 09/11/2015, 12:50 PM

## 2015-09-11 NOTE — Anesthesia Postprocedure Evaluation (Signed)
Anesthesia Post Note  Patient: Jamie Waters  Procedure(s) Performed: Procedure(s) (LRB): LAPAROSCOPY OPERATIVE, Right Salpingectomy with Removal Ectopic Pregnancy (N/A)  Patient location during evaluation: PACU Anesthesia Type: General Level of consciousness: awake and alert Pain management: pain level controlled Vital Signs Assessment: post-procedure vital signs reviewed and stable Respiratory status: spontaneous breathing, nonlabored ventilation, respiratory function stable and patient connected to nasal cannula oxygen Cardiovascular status: blood pressure returned to baseline and stable Postop Assessment: no signs of nausea or vomiting Anesthetic complications: no    Last Vitals:  Filed Vitals:   09/11/15 1945 09/11/15 2000  BP: 121/52 120/51  Pulse: 95 117  Temp:    Resp: 19 16    Last Pain:  Filed Vitals:   09/11/15 2008  PainSc: 3                  Reino KentJudd, Lino Wickliff J

## 2015-09-11 NOTE — Discharge Instructions (Signed)

## 2015-09-11 NOTE — H&P (Signed)
Jamie Waters is an 25 y.o. female.  25 yo G2P0010 @ 5+4 weeks presents for evaluation of progressive worsening of right sided abdominal pain. The patient has a h/o a prior ectopic pregnancy on the right back in 10/2014. She was treated with methotrexate at that time and had complete resolution. She became pregnant with clomiphene citrate.She had a quantitative HCG performed in our office on 1/7 that was 519. Today she has had worsening right sided pain and came for evaluation. Quant today was 620 and pelvic ultrasound showed suspected right sided ectopic with free fluid in the pelvis. Given that this is the 2nd ectopic on the right and that she has evidence of hemoperitoneum it was recommended to proceed with definitive surgical management. R/B/A of the procedure were reviewed and informed consent was obtained.   Patient's last menstrual period was 08/03/2015.    Past Medical History  Diagnosis Date  . Asthma   . Raynauds syndrome   . Raynauds syndrome 2015  . Fibromyalgia   . Arthritis   . PCOS (polycystic ovarian syndrome) 2015  . Raynaud disease   . Ectopic fetus   . Raynaud disease     Past Surgical History  Procedure Laterality Date  . Cyst removed from throat    . Thyroidectomy, partial  2010    Family History  Problem Relation Age of Onset  . Lupus Maternal Grandmother   . Cancer Maternal Grandmother   . Cancer Paternal Grandfather     Social History:  reports that she has never smoked. She does not have any smokeless tobacco history on file. She reports that she does not drink alcohol or use illicit drugs.  Allergies:  Allergies  Allergen Reactions  . Norvasc [Amlodipine Besylate] Shortness Of Breath and Other (See Comments)    Caused tightness in chest.    Prescriptions prior to admission  Medication Sig Dispense Refill Last Dose  . hydroxychloroquine (PLAQUENIL) 200 MG tablet Take 200 mg by mouth 2 (two) times daily.   Past Week at Unknown time  . Prenatal  Vit-Fe Fumarate-FA (MULTIVITAMIN-PRENATAL) 27-0.8 MG TABS tablet Take 1 tablet by mouth daily at 12 noon.   09/10/2015 at Unknown time  . ibuprofen (ADVIL,MOTRIN) 600 MG tablet Take 1 tablet (600 mg total) by mouth 3 (three) times daily after meals. (Patient not taking: Reported on 09/11/2015) 30 tablet 0 Not Taking at Unknown time  . naproxen (NAPROSYN) 250 MG tablet Take 1 tablet (250 mg total) by mouth 2 (two) times daily as needed for mild pain or moderate pain (take with food). (Patient not taking: Reported on 09/11/2015) 14 tablet 0 Not Taking at Unknown time  . ondansetron (ZOFRAN) 4 MG tablet Take 1 tablet (4 mg total) by mouth every 8 (eight) hours as needed for nausea or vomiting. (Patient not taking: Reported on 09/11/2015) 8 tablet 0 Not Taking at Unknown time  . oxyCODONE-acetaminophen (PERCOCET/ROXICET) 5-325 MG per tablet 1 or 2 tabs PO q6h prn pain (Patient not taking: Reported on 09/11/2015) 25 tablet 0 Not Taking at Unknown time  . tamsulosin (FLOMAX) 0.4 MG CAPS capsule Take 1 capsule (0.4 mg total) by mouth at bedtime. (Patient not taking: Reported on 09/11/2015) 5 capsule 0 Not Taking at Unknown time  . traMADol (ULTRAM) 50 MG tablet Take 1 tablet (50 mg total) by mouth every 6 (six) hours as needed. (Patient not taking: Reported on 09/11/2015) 15 tablet 0 Not Taking at Unknown time    ROS: as above  Blood pressure  126/75, pulse 116, temperature 98.1 F (36.7 C), temperature source Oral, resp. rate 18, height 5' (1.524 m), weight 75.751 kg (167 lb), last menstrual period 08/03/2015. Physical Exam   Aox3, nad Abdomen tender with rebound and guarding  Results for orders placed or performed during the hospital encounter of 09/11/15 (from the past 24 hour(s))  Urinalysis, Routine w reflex microscopic (not at [facility])     Status: Abnormal   Collection Time: 09/11/15 12:20 PM  Result Value Ref Range   Color, Urine YELLOW YELLOW   APPearance CLEAR CLEAR   Specific Gravity, Urine >1.030 (H) 1.005  - 1.030   pH 5.5 5.0 - 8.0   Glucose, UA NEGATIVE NEGATIVE mg/dL   Hgb urine dipstick TRACE (A) NEGATIVE   Bilirubin Urine NEGATIVE NEGATIVE   Ketones, ur >80 (A) NEGATIVE mg/dL   Protein, ur NEGATIVE NEGATIVE mg/dL   Nitrite NEGATIVE NEGATIVE   Leukocytes, UA SMALL (A) NEGATIVE  Urine microscopic-add on     Status: Abnormal   Collection Time: 09/11/15 12:20 PM  Result Value Ref Range   Squamous Epithelial / LPF 0-5 (A) NONE SEEN   WBC, UA 6-30 0 - 5 WBC/hpf   RBC / HPF 0-5 0 - 5 RBC/hpf   Bacteria, UA RARE (A) NONE SEEN   Urine-Other MUCOUS PRESENT   Pregnancy, urine POC     Status: Abnormal   Collection Time: 09/11/15  1:00 PM  Result Value Ref Range   Preg Test, Ur POSITIVE (A) NEGATIVE  Wet prep, genital     Status: Abnormal   Collection Time: 09/11/15  1:30 PM  Result Value Ref Range   Yeast Wet Prep HPF POC NONE SEEN NONE SEEN   Trich, Wet Prep NONE SEEN NONE SEEN   Clue Cells Wet Prep HPF POC PRESENT (A) NONE SEEN   WBC, Wet Prep HPF POC MANY (A) NONE SEEN   Sperm NONE SEEN   hCG, quantitative, pregnancy     Status: Abnormal   Collection Time: 09/11/15  1:35 PM  Result Value Ref Range   hCG, Beta Chain, Quant, S 679 (H) <5 mIU/mL  CBC     Status: Abnormal   Collection Time: 09/11/15  1:35 PM  Result Value Ref Range   WBC 12.2 (H) 4.0 - 10.5 K/uL   RBC 4.05 3.87 - 5.11 MIL/uL   Hemoglobin 12.4 12.0 - 15.0 g/dL   HCT 16.1 09.6 - 04.5 %   MCV 92.3 78.0 - 100.0 fL   MCH 30.6 26.0 - 34.0 pg   MCHC 33.2 30.0 - 36.0 g/dL   RDW 40.9 81.1 - 91.4 %   Platelets 298 150 - 400 K/uL  Rh IG workup (includes ABO/Rh)     Status: None (Preliminary result)   Collection Time: 09/11/15  1:35 PM  Result Value Ref Range   Gestational Age(Wks) 5    ABO/RH(D) A NEG    Antibody Screen NEG    Unit Number [id number]    Blood Component Type RHIG    Unit division 00    Status of Unit ISSUED    Transfusion Status OK TO TRANSFUSE     US Ob Comp Less 14 Wks  09/11/2015  CLINICAL  DATA:  Pain, bleeding. History of a previous right ectopic pregnancy. EXAM: OBSTETRIC <14 WK [place] AND TRANSVAGINAL OB US TECHNIQUE: Both transabdominal and transvaginal ultrasound examinations were performed for complete evaluation of the gestation as well as the maternal uterus, adnexal regions, and pelvic cul-de-sac. Transvaginal technique was performed  to assess early pregnancy. COMPARISON:  None. FINDINGS: Intrauterine gestational sac: None.  Uterus is unremarkable. Maternal uterus/adnexae: Rounded mass is identified within the right adnexa, separate from the right ovary, measuring 3.3 x 2.4 x 2.9 cm, with hypoechoic focus centrally suggestive of a gestational sac of early pregnancy, highly suggestive of adnexal ectopic pregnancy. Small amount of complex fluid is seen within the adjacent right adnexa, suspicious for associated blood products. Additional simple-appearing free fluid in the cul-de-sac. Left ovary appears normal. No mass or free fluid identified in the left adnexal region. IMPRESSION: 1. Right adnexal mass measuring 3.3 x 2.9 cm, separate from the right ovary, highly suggestive of adnexal ectopic pregnancy. Small amount of complex free fluid in the adjacent right adnexa, suspicious for associated blood products. 2. No IUP seen. These results were called by telephone at the time of interpretation on 09/11/2015 at 2:40 pm to Dr. Eveline KetoKATHY HARRIS , who verbally acknowledged these results. Electronically Signed   By: Bary RichardStan  Maynard M.D.   On: 09/11/2015 14:40   [place] Ob Transvaginal  09/11/2015  CLINICAL DATA:  Pain, bleeding. History of a previous right ectopic pregnancy. EXAM: OBSTETRIC <14 WK US AND TRANSVAGINAL OB US TECHNIQUE: Both transabdominal and transvaginal ultrasound examinations were performed for complete evaluation of the gestation as well as the maternal uterus, adnexal regions, and pelvic cul-de-sac. Transvaginal technique was performed to assess early pregnancy. COMPARISON:  None. FINDINGS:  Intrauterine gestational sac: None.  Uterus is unremarkable. Maternal uterus/adnexae: Rounded mass is identified within the right adnexa, separate from the right ovary, measuring 3.3 x 2.4 x 2.9 cm, with hypoechoic focus centrally suggestive of a gestational sac of early pregnancy, highly suggestive of adnexal ectopic pregnancy. Small amount of complex fluid is seen within the adjacent right adnexa, suspicious for associated blood products. Additional simple-appearing free fluid in the cul-de-sac. Left ovary appears normal. No mass or free fluid identified in the left adnexal region. IMPRESSION: 1. Right adnexal mass measuring 3.3 x 2.9 cm, separate from the right ovary, highly suggestive of adnexal ectopic pregnancy. Small amount of complex free fluid in the adjacent right adnexa, suspicious for associated blood products. 2. No IUP seen. These results were called by telephone at the time of interpretation on 09/11/2015 at 2:40 pm to Dr. Eveline KetoKATHY HARRIS , who verbally acknowledged these results. Electronically Signed   By: Bary RichardStan  Maynard M.D.   On: 09/11/2015 14:40    Assessment/Plan: 1) Admit 2) NPO 3) Proceed with L/S right ectopic 4) SCDs for DVT prophylaxis  Wilfred Siverson H. 09/11/2015, 4:41 PM

## 2015-09-11 NOTE — MAU Note (Signed)
Upon calling patient from lobby was told she went to the bathroom unable to obtain urine sample for testing at this time. Patient presents with lower abdominal pain and right side pain since early this morning, history of right side ectopic pregnancy last night, having vaginal pain, hurts to pass her urine.

## 2015-09-11 NOTE — MAU Note (Signed)
C/o paIn in R flank with some shooting pain down into the vagina that started this AM around 0600; hx of ectopic pregnancy last year(Feb);

## 2015-09-12 ENCOUNTER — Encounter (HOSPITAL_COMMUNITY): Payer: Self-pay | Admitting: Obstetrics and Gynecology

## 2015-09-12 LAB — HIV ANTIBODY (ROUTINE TESTING W REFLEX): HIV Screen 4th Generation wRfx: NONREACTIVE

## 2015-09-12 LAB — RH IG WORKUP (INCLUDES ABO/RH)
ABO/RH(D): A NEG
Antibody Screen: NEGATIVE
Gestational Age(Wks): 5
Unit division: 0

## 2015-09-12 LAB — GC/CHLAMYDIA PROBE AMP (~~LOC~~) NOT AT ARMC
Chlamydia: NEGATIVE
Neisseria Gonorrhea: NEGATIVE

## 2015-09-12 MED ORDER — OXYCODONE-ACETAMINOPHEN 5-325 MG PO TABS: 1.0000 | ORAL_TABLET | ORAL | Status: DC | PRN

## 2015-09-12 MED ORDER — INFLUENZA VAC SPLIT QUAD 0.5 ML IM SUSY
0.5000 mL | PREFILLED_SYRINGE | Freq: Once | INTRAMUSCULAR | Status: AC
  Administered 2015-09-12: 0.5 mL via INTRAMUSCULAR
  Filled 2015-09-12: qty 0.5

## 2015-09-12 NOTE — Progress Notes (Signed)
POD#1 Pt states that she has some discomfort in her RUQ of abd. This is c/w diaphragmatic discomfort from gas. ABd - soft , non distended. Incisions healing well. Has received rhogam VSSAF IMP/ Stable Plan/ Will discharge to home

## 2015-09-12 NOTE — Anesthesia Postprocedure Evaluation (Signed)
Anesthesia Post Note  Patient: Jamie Waters  Procedure(s) Performed: Procedure(s) (LRB): LAPAROSCOPY OPERATIVE, Right Salpingectomy with Removal Ectopic Pregnancy (N/A)  Patient location during evaluation: [facility] Anesthesia Type: General Level of consciousness: awake and alert Pain management: pain level controlled Vital Signs Assessment: post-procedure vital signs reviewed and stable Respiratory status: spontaneous breathing Cardiovascular status: stable Postop Assessment: no headache, no backache, no signs of nausea or vomiting and adequate PO intake Anesthetic complications: no    Last Vitals:  Filed Vitals:   09/12/15 0235 09/12/15 0541  BP: 109/52 105/44  Pulse: 99 71  Temp: 36.7 C 37.1 C  Resp: 20     Last Pain:  Filed Vitals:   09/12/15 0708  PainSc: 2                  Eason Housman Hristova

## 2015-09-12 NOTE — Plan of Care (Signed)
Problem: Safety: Goal: Ability to remain free from injury will improve Outcome: Completed/Met Date Met:  09/12/15 Patient is aware and has agreed not to get out of bed without assist.  Problem: Pain Managment: Goal: General experience of comfort will improve Outcome: Completed/Met Date Met:  09/12/15 Good pain control on po Motrin and Percocet.  Problem: Physical Regulation: Goal: Ability to maintain clinical measurements within normal limits will improve Outcome: Completed/Met Date Met:  09/12/15 Tachy heart rate has resolved.  Problem: Activity: Goal: Risk for activity intolerance will decrease Outcome: Completed/Met Date Met:  09/12/15 Tolerates walking to bathroom well.  Problem: Nutrition: Goal: Adequate nutrition will be maintained Outcome: Completed/Met Date Met:  09/12/15 Tolerates clear liquids well.

## 2015-09-12 NOTE — Addendum Note (Signed)
Addendum  created 09/12/15 0810 by Elgie CongoNataliya H Lovene Maret, CRNA   Modules edited: Clinical Notes   Clinical Notes:  File: [id number]

## 2015-09-12 NOTE — Progress Notes (Signed)
Pt discharged to home with husband.  Pt and husband declined spiritual care visit prior to discharge this morning. Condition stable.  Pt to car via wheelchair with Cherlyn LabellaS. Bertrand, NT.  No equipment for home ordered at discharge.

## 2015-09-12 NOTE — Discharge Summary (Signed)
  Pt is a 25 y/o white female, G2P0 at 5+ weeks who presented with a second ectopic in here right fallopian tube with hematoperitoneum. She underwent a right salingectomy by Dr. Tenny Crawoss. Please read op note. Post op she did well. At the time of discharge she had adequate pain control, was tolerating a diet, and her exam was wnl. She will return to the office in  4 weeks . Post op discharge instructions were given

## 2015-09-14 LAB — CULTURE, OB URINE: Special Requests: NORMAL

## 2016-02-14 ENCOUNTER — Ambulatory Visit (INDEPENDENT_AMBULATORY_CARE_PROVIDER_SITE_OTHER): Payer: BLUE CROSS/BLUE SHIELD | Admitting: Clinical

## 2016-02-14 ENCOUNTER — Encounter (HOSPITAL_COMMUNITY): Payer: Self-pay | Admitting: Clinical

## 2016-02-14 DIAGNOSIS — F3181 Bipolar II disorder: Secondary | ICD-10-CM | POA: Diagnosis not present

## 2016-02-14 DIAGNOSIS — F4321 Adjustment disorder with depressed mood: Secondary | ICD-10-CM

## 2016-02-14 DIAGNOSIS — F411 Generalized anxiety disorder: Secondary | ICD-10-CM
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
## 2016-03-13 ENCOUNTER — Ambulatory Visit (INDEPENDENT_AMBULATORY_CARE_PROVIDER_SITE_OTHER): Payer: BLUE CROSS/BLUE SHIELD | Admitting: Clinical

## 2016-03-13 ENCOUNTER — Encounter (HOSPITAL_COMMUNITY): Payer: Self-pay | Admitting: Clinical

## 2016-03-13 DIAGNOSIS — F3181 Bipolar II disorder: Secondary | ICD-10-CM

## 2016-03-13 DIAGNOSIS — F411 Generalized anxiety disorder: Secondary | ICD-10-CM | POA: Diagnosis not present

## 2016-03-13 DIAGNOSIS — F4321 Adjustment disorder with depressed mood: Secondary | ICD-10-CM

## 2016-03-13 NOTE — Progress Notes (Signed)
   THERAPIST PROGRESS NOTE  Session Time: 7:09 -8:02  Participation Level: Active  Behavioral Response: CasualAlertAnxious and Depressed  Type of Therapy: Individual Therapy  Treatment Goals addressed: improve psychiatric symptoms, improve unhelpful thought patterns, emotional regulation  Interventions: CBT and Motivational Interviewing, psycho education, Grounding and mindfulness techniques  Summary: Jamie Waters is a 25 y.o. female who presents with Bipolar II disorder, Generalized Anxiety Disorder, Grief.   Suicidal/Homicidal: Nowithout intent/plan  Therapist Response: Caryl Pina met with clinician for an individual session. [name] discuss  her psychiatric symptoms, her current life event, and her goal for therapy. [name] shared about her life, her stressors, and her current coping skills. She shared that she feels overwhelmed with emotions. She shared that she feels especially sadden by her lost pregnancies.Clinician agreed that it is very sad to lose a child you are expecting. [name] shared about others in her life that have children but have lost them to the system due to their own behaviors. She cried while discussing the unfairness of this. Clinician introduced some basic cbt concepts. Client and clinician discussed how our thoughts affect our perceptions and emotions. Client and clinician used the topic being discussed as an example. [name] identified her negative automatic thoughts. Caryl Pina and clinician discussed the evidence for and against some of the thoughts. [name] the insight that others having children did not make her loss better or worse, that it was sad in an of itself. Client and clinician agreed to discuss the process of challenging negative thoughts and beliefs in future session. Clinician introduced grounding and mindfulness techniques. Clinician explained the process, purpose and practice of the techniques. Client and clinician practiced the techniques together. [name] to complete a homework packet and to practice the grounding techniques daily.    Plan: Return again in 1-2 weeks.  Diagnosis: Axis I: Bipolar II disorder, Generalized Anxiety Disorder, Grief    [name] A, LCSW 03/13/2016

## 2016-03-16 ENCOUNTER — Other Ambulatory Visit: Payer: Self-pay | Admitting: Obstetrics and Gynecology

## 2016-03-16 DIAGNOSIS — Z113 Encounter for screening for infections with a predominantly sexual mode of transmission: Secondary | ICD-10-CM | POA: Diagnosis not present

## 2016-03-16 DIAGNOSIS — N909 Noninflammatory disorder of vulva and perineum, unspecified: Secondary | ICD-10-CM | POA: Diagnosis not present

## 2016-04-03 ENCOUNTER — Ambulatory Visit (HOSPITAL_COMMUNITY): Payer: Self-pay | Admitting: Clinical

## 2016-04-20 ENCOUNTER — Other Ambulatory Visit: Payer: Self-pay | Admitting: Obstetrics and Gynecology

## 2016-04-20 DIAGNOSIS — Z6832 Body mass index (BMI) 32.0-32.9, adult: Secondary | ICD-10-CM | POA: Diagnosis not present

## 2016-04-20 DIAGNOSIS — Z124 Encounter for screening for malignant neoplasm of cervix: Secondary | ICD-10-CM | POA: Diagnosis not present

## 2016-04-20 DIAGNOSIS — Z01419 Encounter for gynecological examination (general) (routine) without abnormal findings: Secondary | ICD-10-CM | POA: Diagnosis not present

## 2016-04-23 LAB — CYTOLOGY - PAP

## 2016-04-24 ENCOUNTER — Ambulatory Visit (INDEPENDENT_AMBULATORY_CARE_PROVIDER_SITE_OTHER): Payer: BLUE CROSS/BLUE SHIELD | Admitting: Clinical

## 2016-04-24 ENCOUNTER — Encounter (INDEPENDENT_AMBULATORY_CARE_PROVIDER_SITE_OTHER): Payer: Self-pay

## 2016-04-24 DIAGNOSIS — F411 Generalized anxiety disorder: Secondary | ICD-10-CM

## 2016-04-24 DIAGNOSIS — F3181 Bipolar II disorder: Secondary | ICD-10-CM | POA: Diagnosis not present

## 2016-04-24 DIAGNOSIS — F4321 Adjustment disorder with depressed mood: Secondary | ICD-10-CM

## 2016-04-24 NOTE — Progress Notes (Signed)
   THERAPIST PROGRESS NOTE  Session Time: 9:00 - 9:55  Participation Level: Active  Behavioral Response: CasualAlertAnxious and Depressed  Type of Therapy: Individual Therapy  Treatment Goals addressed: improve psychiatric symptoms, improve unhelpful thought patterns, emotional regulation  Interventions: CBT and Motivational Interviewing, psycho education, Grounding and mindfulness techniques  Summary: Jamie Waters is a 25 y.o. female who presents with Bipolar II disorder, Generalized Anxiety Disorder, Grief.   Suicidal/Homicidal: Nowithout intent/plan  Therapist Response: Caryl Pina met with clinician for an individual session. Jamie Waters discuss  her psychiatric symptoms, her current life event, and her Homework. CliniciaAshley shared that she has had increased depression and also anxiety. She the fact that tated that she had 3 "major" panic attacks. She stated that politics was the cause of it. Client and clinician discussed the thought emotion connection. Client and clinician discussed her thoughts and emotions. Clinician asked open ended questions and Jamie Waters identified ways to decrease her anxiety about politics. She shared she could decrease her news intake while still staying informed, she could participate in a way that has meaning for her, she could personally act in the ways that she believes embody her values. Jamie Waters also shared about getting very upset and snappy with her husband and then very upset with her self.  Clinician asked open ended questions and Jamie Waters shared her behaviors would be different if it were someone else. She had the insight that it was because her perspective would different. Client and clinician discussed ways to challenge and change her perspective (thoughts). Client and clinician discussed her homework. She shared she has been doing her mindfulness and grounding techniques. Client and clinician discussed her practice and clinician introduced an additional one  to practice. Clinician also gave Jamie Waters a homework packet which she agreed to complete and bring back with her to next session      Plan: Return again in 1-2 weeks.  Diagnosis:                Axis I: Bipolar II disorder, Generalized Anxiety Disorder, Grief     Lonna Rabold A, LCSW 04/24/2016

## 2016-04-28 ENCOUNTER — Encounter (HOSPITAL_COMMUNITY): Payer: Self-pay | Admitting: Clinical

## 2016-04-30 DIAGNOSIS — R1310 Dysphagia, unspecified: Secondary | ICD-10-CM | POA: Diagnosis not present

## 2016-04-30 DIAGNOSIS — Z888 Allergy status to other drugs, medicaments and biological substances status: Secondary | ICD-10-CM | POA: Diagnosis not present

## 2016-04-30 DIAGNOSIS — Z9089 Acquired absence of other organs: Secondary | ICD-10-CM | POA: Diagnosis not present

## 2016-04-30 DIAGNOSIS — F319 Bipolar disorder, unspecified: Secondary | ICD-10-CM | POA: Diagnosis not present

## 2016-04-30 DIAGNOSIS — E039 Hypothyroidism, unspecified: Secondary | ICD-10-CM | POA: Diagnosis not present

## 2016-04-30 DIAGNOSIS — E890 Postprocedural hypothyroidism: Secondary | ICD-10-CM | POA: Diagnosis not present

## 2016-04-30 DIAGNOSIS — R221 Localized swelling, mass and lump, neck: Secondary | ICD-10-CM | POA: Diagnosis not present

## 2016-05-08 DIAGNOSIS — R7301 Impaired fasting glucose: Secondary | ICD-10-CM | POA: Diagnosis not present

## 2016-05-08 DIAGNOSIS — E282 Polycystic ovarian syndrome: Secondary | ICD-10-CM | POA: Diagnosis not present

## 2016-05-10 ENCOUNTER — Ambulatory Visit (HOSPITAL_COMMUNITY): Payer: Self-pay | Admitting: Clinical

## 2016-05-14 DIAGNOSIS — R7301 Impaired fasting glucose: Secondary | ICD-10-CM | POA: Diagnosis not present

## 2016-05-14 DIAGNOSIS — L680 Hirsutism: Secondary | ICD-10-CM | POA: Diagnosis not present

## 2016-05-14 DIAGNOSIS — R635 Abnormal weight gain: Secondary | ICD-10-CM | POA: Diagnosis not present

## 2016-05-14 DIAGNOSIS — N926 Irregular menstruation, unspecified: Secondary | ICD-10-CM | POA: Diagnosis not present

## 2016-05-21 ENCOUNTER — Ambulatory Visit (INDEPENDENT_AMBULATORY_CARE_PROVIDER_SITE_OTHER): Payer: BLUE CROSS/BLUE SHIELD | Admitting: Clinical

## 2016-05-21 DIAGNOSIS — F411 Generalized anxiety disorder: Secondary | ICD-10-CM

## 2016-05-21 DIAGNOSIS — F4321 Adjustment disorder with depressed mood: Secondary | ICD-10-CM

## 2016-05-21 DIAGNOSIS — F3181 Bipolar II disorder: Secondary | ICD-10-CM

## 2016-05-21 NOTE — Progress Notes (Signed)
   THERAPIST PROGRESS NOTE  Session Time: 7:04 -8:02   Participation Level: Active  Behavioral Response: CasualAlertAnxious and Depressed  Type of Therapy: Individual Therapy  Treatment Goals addressed: Improve Psychiatric Symptoms, decrease anxiety and irrational worry ( decrease stress), Improve unhelpful thought patterns, emotional regulation skills (stress management),   healthy coping skills  Interventions: Motivational Interviewing, Cognitive Behavior Therapy and Grounding/Mindfulness Skills, psychoeducation  Summary: Jamie Waters is a 25 y.o. female who presents with Bipolar II, Generalized Anxiety Disorder, and Grief.   Suicidal/Homicidal: No - without intent/plan  Therapist Response:  Caryl Pina met with clinician for an individual session. Jamie Waters's husband joined the session to support her. Jamie Waters discussed her psychiatric symptoms, her current life events and her homework. Jamie Waters shared that she missed her last appointment due to the fact that it was the anniversary of her last miscarriage. She shared she has also has been experiencing a lot of pain. Clinician asked open ended questions and Jamie Waters shared about her grief about not having a child. Jamie Waters shared that it was intensified by her sister's lack of fulfilling her parental responsibility.  Clinician asked open ended questions and Jamie Waters was able to recognize the events as separate. Jamie Waters shared some of her additional negative automatic thoughts. Clinician asked open ended questions and Talicia identified the evidence for and against the thoughts. Jamie Waters was then able to formulate healthier alternative thoughts. Client and clinician discussed the process and Jamie Waters's insights. Jamie Waters also shared about some anxiety and fear she has been experiencing. Client and clinician discussed her desired outcomes. Client and clinician discussed the cbt process. Clinician asked open ended questions and Jamie Waters identified the evidence for and  against the thoughts. Jamie Waters was then able to formulate healthier alternative thoughts. Jamie Waters had the insight that she could not have her desired outcomes if she continued to believe the unhelpful thoughts.    Plan: Return again in 2 weeks.  Diagnosis:     Axis I: Bipolar II, Generalized Anxiety Disorder, and Grief.    Ariez Neilan A, LCSW 05/21/2016

## 2016-05-23 ENCOUNTER — Encounter (HOSPITAL_COMMUNITY): Payer: Self-pay | Admitting: Clinical

## 2016-06-05 ENCOUNTER — Inpatient Hospital Stay (HOSPITAL_COMMUNITY)
Admission: AD | Admit: 2016-06-05 | Discharge: 2016-06-05 | Disposition: A | Discharge status: Home / Self Care | Payer: BLUE CROSS/BLUE SHIELD | Source: Ambulatory Visit | Attending: Obstetrics and Gynecology | Admitting: Obstetrics and Gynecology

## 2016-06-05 ENCOUNTER — Ambulatory Visit (HOSPITAL_COMMUNITY): Payer: Self-pay | Admitting: Clinical

## 2016-06-05 ENCOUNTER — Inpatient Hospital Stay (HOSPITAL_COMMUNITY): Payer: BLUE CROSS/BLUE SHIELD

## 2016-06-05 ENCOUNTER — Encounter (HOSPITAL_COMMUNITY): Payer: Self-pay | Admitting: *Deleted

## 2016-06-05 DIAGNOSIS — O209 Hemorrhage in early pregnancy, unspecified: Secondary | ICD-10-CM | POA: Diagnosis not present

## 2016-06-05 DIAGNOSIS — O26891 Other specified pregnancy related conditions, first trimester: Secondary | ICD-10-CM | POA: Diagnosis not present

## 2016-06-05 DIAGNOSIS — O3680X0 Pregnancy with inconclusive fetal viability, not applicable or unspecified: Secondary | ICD-10-CM

## 2016-06-05 DIAGNOSIS — Z6791 Unspecified blood type, Rh negative: Secondary | ICD-10-CM | POA: Insufficient documentation

## 2016-06-05 DIAGNOSIS — Z3A00 Weeks of gestation of pregnancy not specified: Secondary | ICD-10-CM | POA: Diagnosis not present

## 2016-06-05 DIAGNOSIS — O26851 Spotting complicating pregnancy, first trimester: Secondary | ICD-10-CM | POA: Diagnosis not present

## 2016-06-05 DIAGNOSIS — Z3A01 Less than 8 weeks gestation of pregnancy: Secondary | ICD-10-CM | POA: Insufficient documentation

## 2016-06-05 LAB — URINALYSIS, ROUTINE W REFLEX MICROSCOPIC
Bilirubin Urine: NEGATIVE
Glucose, UA: NEGATIVE mg/dL
Hgb urine dipstick: NEGATIVE
Ketones, ur: 40 mg/dL — AB
Leukocytes, UA: NEGATIVE
Nitrite: NEGATIVE
Protein, ur: NEGATIVE mg/dL
Specific Gravity, Urine: 1.025 (ref 1.005–1.030)
pH: 6 (ref 5.0–8.0)

## 2016-06-05 LAB — CBC
HCT: 39 % (ref 36.0–46.0)
Hemoglobin: 13.1 g/dL (ref 12.0–15.0)
MCH: 30.6 pg (ref 26.0–34.0)
MCHC: 33.6 g/dL (ref 30.0–36.0)
MCV: 91.1 fL (ref 78.0–100.0)
Platelets: 275 10*3/uL (ref 150–400)
RBC: 4.28 MIL/uL (ref 3.87–5.11)
RDW: 13.5 % (ref 11.5–15.5)
WBC: 9.2 10*3/uL (ref 4.0–10.5)

## 2016-06-05 LAB — POCT PREGNANCY, URINE: Preg Test, Ur: POSITIVE — AB

## 2016-06-05 LAB — HCG, QUANTITATIVE, PREGNANCY: hCG, Beta Chain, Quant, S: 129 m[IU]/mL — ABNORMAL HIGH (ref <5)

## 2016-06-05 LAB — WET PREP, GENITAL
Clue Cells Wet Prep HPF POC: NONE SEEN
Sperm: NONE SEEN
Trich, Wet Prep: NONE SEEN
Yeast Wet Prep HPF POC: NONE SEEN

## 2016-06-05 MED ORDER — RHO D IMMUNE GLOBULIN 1500 UNIT/2ML IJ SOSY
300.0000 ug | PREFILLED_SYRINGE | Freq: Once | INTRAMUSCULAR | Status: AC
  Administered 2016-06-05: 300 ug via INTRAMUSCULAR
  Filled 2016-06-05: qty 2

## 2016-06-05 NOTE — MAU Provider Note (Signed)
History     CSN: [id number]  Arrival date and time: 06/05/16 1406    First Provider Initiated Contact with Patient 06/05/16 1731      Chief Complaint  Patient presents with  . Vaginal Bleeding   HPI  Jamie Waters is a 25 y.o. G3P0020 at [redacted]w[redacted]d by LMP who presents with vaginal spotting. Noticed brown spotting last night. Sees spotting on toilet paper but not every time she voids. Passed 1 small clot early this morning. Denies abdominal pain, n/v/d, constipation, or recent intercourse. OB history significant for ectopic pregnancy; 1 treated with MTX & 1 resulted in salpingectomy.    OB History    Gravida Para Term Preterm AB Living   3       2 0   SAB TAB Ectopic Multiple Live Births       2   0      Past Medical History:  Diagnosis Date  . Arthritis   . Asthma   . Ectopic fetus   . Fibromyalgia   . PCOS (polycystic ovarian syndrome) 2015  . Raynauds syndrome 2015    Past Surgical History:  Procedure Laterality Date  . cyst removed from throat    . DIAGNOSTIC LAPAROSCOPY WITH REMOVAL OF ECTOPIC PREGNANCY    . LAPAROSCOPY N/A 09/11/2015   Procedure: LAPAROSCOPY OPERATIVE, Right Salpingectomy with Removal Ectopic Pregnancy;  Surgeon: Waynard Reeds, MD;  Location: [facility];  Service: Gynecology;  Laterality: N/A;  . THYROIDECTOMY, PARTIAL  2010    Family History  Problem Relation Age of Onset  . Dementia Father   . Alcohol abuse Father   . Bipolar disorder Sister   . Autism spectrum disorder Sister   . Lupus Maternal Grandmother   . Cancer Maternal Grandmother   . Cancer Paternal Grandfather     Social History  Substance Use Topics  . Smoking status: Never Smoker  . Smokeless tobacco: Never Used  . Alcohol use No    Allergies:  Allergies  Allergen Reactions  . Cymbalta [Duloxetine Hcl] Shortness Of Breath  . Norvasc [Amlodipine Besylate] Shortness Of Breath and Other (See Comments)    Reaction:  Tightness in chest     Prescriptions Prior to Admission   Medication Sig Dispense Refill Last Dose  . albuterol (PROVENTIL HFA;VENTOLIN HFA) 108 (90 Base) MCG/ACT inhaler Inhale 1-2 puffs into the lungs every 6 (six) hours as needed for wheezing or shortness of breath.   Past Month at Unknown time  . hydroxychloroquine (PLAQUENIL) 200 MG tablet Take 400 mg by mouth at bedtime.    06/04/2016 at Unknown time  . Prenatal Vit-Fe Fumarate-FA (MULTIVITAMIN-PRENATAL) 27-0.8 MG TABS tablet Take 1 tablet by mouth at bedtime.    06/04/2016 at Unknown time    Review of Systems  Constitutional: Negative.   Gastrointestinal: Negative.   Genitourinary: Negative for dysuria.       + vaginal spotting   Physical Exam   Blood pressure 121/71, pulse 99, temperature 98 F (36.7 C), temperature source Oral, resp. rate 18, height 5' (1.524 m), weight 163 lb (73.9 kg), last menstrual period 05/06/2016, unknown if currently breastfeeding.  Physical Exam  Nursing note and vitals reviewed. Constitutional: She is oriented to person, place, and time. She appears well-developed and well-nourished. No distress.  HENT:  Head: Normocephalic and atraumatic.  Eyes: Conjunctivae are normal. Right eye exhibits no discharge. Left eye exhibits no discharge. No scleral icterus.  Neck: Normal range of motion.  Respiratory: Effort normal. No respiratory distress.  GI: Soft. She exhibits no distension. There is no tenderness. There is no rebound and no guarding.  Genitourinary: Uterus normal. Cervix exhibits no motion tenderness and no friability. Right adnexum displays no mass and no tenderness. Left adnexum displays no mass and no tenderness. No bleeding in the vagina. Vaginal discharge (small amount of tan discharge) found.  Genitourinary Comments: Cervix closed  Neurological: She is alert and oriented to person, place, and time.  Skin: Skin is warm and dry. She is not diaphoretic.  Psychiatric: She has a normal mood and affect. Her behavior is normal. Judgment and thought content  normal.    MAU Course  Procedures Results for orders placed or performed during the hospital encounter of 06/05/16 (from the past 24 hour(s))  Urinalysis, Routine w reflex microscopic (not [facility])     Status: Abnormal   Collection Time: 06/05/16  2:25 PM  Result Value Ref Range   Color, Urine YELLOW YELLOW   APPearance CLEAR CLEAR   Specific Gravity, Urine 1.025 1.005 - 1.030   pH 6.0 5.0 - 8.0   Glucose, UA NEGATIVE NEGATIVE mg/dL   Hgb urine dipstick NEGATIVE NEGATIVE   Bilirubin Urine NEGATIVE NEGATIVE   Ketones, ur 40 (A) NEGATIVE mg/dL   Protein, ur NEGATIVE NEGATIVE mg/dL   Nitrite NEGATIVE NEGATIVE   Leukocytes, UA NEGATIVE NEGATIVE  Pregnancy, urine POC     Status: Abnormal   Collection Time: 06/05/16  2:56 PM  Result Value Ref Range   Preg Test, Ur POSITIVE (A) NEGATIVE  CBC     Status: None   Collection Time: 06/05/16  3:24 PM  Result Value Ref Range   WBC 9.2 4.0 - 10.5 K/uL   RBC 4.28 3.87 - 5.11 MIL/uL   Hemoglobin 13.1 12.0 - 15.0 g/dL   HCT 16.139.0 09.636.0 - 04.546.0 %   MCV 91.1 78.0 - 100.0 fL   MCH 30.6 26.0 - 34.0 pg   MCHC 33.6 30.0 - 36.0 g/dL   RDW 40.913.5 81.111.5 - 91.415.5 %   Platelets 275 150 - 400 K/uL  hCG, quantitative, pregnancy     Status: Abnormal   Collection Time: 06/05/16  3:24 PM  Result Value Ref Range   hCG, Beta Chain, Quant, S 129 (H) <5 mIU/mL  Wet prep, genital     Status: Abnormal   Collection Time: 06/05/16  5:45 PM  Result Value Ref Range   Yeast Wet Prep HPF POC NONE SEEN NONE SEEN   Trich, Wet Prep NONE SEEN NONE SEEN   Clue Cells Wet Prep HPF POC NONE SEEN NONE SEEN   WBC, Wet Prep HPF POC MANY (A) NONE SEEN   Sperm NONE SEEN   Rh IG workup (includes ABO/Rh)     Status: None (Preliminary result)   Collection Time: 06/05/16  6:16 PM  Result Value Ref Range   Gestational Age(Wks) 4    ABO/RH(D) A NEG    Antibody Screen NEG    Unit Number [id number]    Blood Component Type RHIG    Unit division 00    Status of Unit ISSUED     Transfusion Status OK TO TRANSFUSE    [place] Ob Comp Less 14 Wks  Result Date: 06/05/2016 CLINICAL DATA:  Vaginal spotting.  History of ectopic pregnancy. EXAM: OBSTETRIC <14 WK US AND TRANSVAGINAL OB US TECHNIQUE: Both transabdominal and transvaginal ultrasound examinations were performed for complete evaluation of the gestation as well as the maternal uterus, adnexal regions, and pelvic cul-de-sac. Transvaginal technique was  performed to assess early pregnancy. COMPARISON:  09/11/2015 FINDINGS: Intrauterine gestational sac: None Maternal uterus/adnexae: No evidence for an intrauterine gestational sac. No significant fluid within the endometrial cavity. Endometrium is mildly heterogeneous. Heterogeneous structure along the posterior aspect of the uterus measures 1.4 x 1.3 x 1.1 cm and suggestive for a small fibroid. There is a hypoechoic structure in the right ovary with an internal reticular pattern. There is no significant flow within this hypoechoic structure and this is suggestive for a hemorrhagic cyst measuring up to 3.2 cm. There is a heterogeneous structure in the left ovary that measures roughly 2.4 cm and could represent a corpus luteum. Trace free fluid. IMPRESSION: Pregnancy location not visualized sonographically. Differential diagnosis includes recent spontaneous abortion, IUP too early to visualize, and non-visualized ectopic pregnancy. Recommend close follow up of quantitative B-HCG levels, and follow up US as clinically warranted. Right ovarian hemorrhagic cyst. Electronically Signed   By: Richarda Overlie M.D.   On: 06/05/2016 17:41   US Ob Transvaginal  Result Date: 06/05/2016 CLINICAL DATA:  Vaginal spotting.  History of ectopic pregnancy. EXAM: OBSTETRIC <14 WK [place] AND TRANSVAGINAL OB US TECHNIQUE: Both transabdominal and transvaginal ultrasound examinations were performed for complete evaluation of the gestation as well as the maternal uterus, adnexal regions, and pelvic cul-de-sac. Transvaginal  technique was performed to assess early pregnancy. COMPARISON:  09/11/2015 FINDINGS: Intrauterine gestational sac: None Maternal uterus/adnexae: No evidence for an intrauterine gestational sac. No significant fluid within the endometrial cavity. Endometrium is mildly heterogeneous. Heterogeneous structure along the posterior aspect of the uterus measures 1.4 x 1.3 x 1.1 cm and suggestive for a small fibroid. There is a hypoechoic structure in the right ovary with an internal reticular pattern. There is no significant flow within this hypoechoic structure and this is suggestive for a hemorrhagic cyst measuring up to 3.2 cm. There is a heterogeneous structure in the left ovary that measures roughly 2.4 cm and could represent a corpus luteum. Trace free fluid. IMPRESSION: Pregnancy location not visualized sonographically. Differential diagnosis includes recent spontaneous abortion, IUP too early to visualize, and non-visualized ectopic pregnancy. Recommend close follow up of quantitative B-HCG levels, and follow up US as clinically warranted. Right ovarian hemorrhagic cyst. Electronically Signed   By: Richarda Overlie M.D.   On: 06/05/2016 17:41    MDM +UPT UA, wet prep, GC/chlamydia, CBC, ABO/Rh, quant hCG, HIV, and [place] today to rule out ectopic pregnancy A negative -- rhogham given today BHCG  Ultrasound shows no IUP, right hemorrhagic cyst, and left CLC, trace free fluid S/w Dr. Henderson Cloud regarding results -- pt to f/u in office for BHCG  Assessment and Plan  A: 1. Pregnancy of unknown anatomic location   2. Vaginal bleeding in pregnancy, first trimester   3. Rh negative status during pregnancy in first trimester    P: Discharge home Pelvic rest Strict return precautions for s/s ectopic pregnancy Call office in morning for f/u BHCG later this week  Judeth Horn 06/05/2016, 5:30 PM

## 2016-06-05 NOTE — Discharge Instructions (Signed)

## 2016-06-05 NOTE — MAU Note (Signed)
Pt had pos HPT on Friday, started having brown spotting last night, passed one very small blood clot - continues today.  Hx of ectopic pregnancy x 2.  Denies pain.

## 2016-06-06 LAB — GC/CHLAMYDIA PROBE AMP (~~LOC~~) NOT AT ARMC
Chlamydia: NEGATIVE
Neisseria Gonorrhea: NEGATIVE

## 2016-06-06 LAB — RH IG WORKUP (INCLUDES ABO/RH)
ABO/RH(D): A NEG
Antibody Screen: NEGATIVE
Gestational Age(Wks): 4
Unit division: 0

## 2016-06-06 LAB — HIV ANTIBODY (ROUTINE TESTING W REFLEX): HIV Screen 4th Generation wRfx: NONREACTIVE

## 2016-06-07 DIAGNOSIS — Z8759 Personal history of other complications of pregnancy, childbirth and the puerperium: Secondary | ICD-10-CM | POA: Diagnosis not present

## 2016-06-11 DIAGNOSIS — O200 Threatened abortion: Secondary | ICD-10-CM | POA: Diagnosis not present

## 2016-06-15 DIAGNOSIS — O021 Missed abortion: Secondary | ICD-10-CM | POA: Diagnosis not present

## 2016-06-18 ENCOUNTER — Ambulatory Visit (INDEPENDENT_AMBULATORY_CARE_PROVIDER_SITE_OTHER): Payer: BLUE CROSS/BLUE SHIELD | Admitting: Clinical

## 2016-06-18 DIAGNOSIS — F411 Generalized anxiety disorder: Secondary | ICD-10-CM | POA: Diagnosis not present

## 2016-06-18 DIAGNOSIS — F4320 Adjustment disorder, unspecified: Secondary | ICD-10-CM | POA: Diagnosis not present

## 2016-06-18 DIAGNOSIS — F3181 Bipolar II disorder: Secondary | ICD-10-CM | POA: Diagnosis not present

## 2016-06-18 DIAGNOSIS — F4321 Adjustment disorder with depressed mood: Secondary | ICD-10-CM

## 2016-06-18 NOTE — Progress Notes (Signed)
   THERAPIST PROGRESS NOTE  Session Time: 3:33 -4:28  Participation Level: Active  Behavioral Response: CasualAlertDepressed  Type of Therapy: Individual Therapy  Treatment Goals addressed: Improve Psychiatric Symptoms, decrease anxiety and irrational worry ( decrease stress), Improve unhelpful thought patterns, emotional regulation skills (stress management,),  deliberate thought process (improved social functioning),  healthy coping skills  Interventions: Motivational Interviewing, Cognitive Behavior Therapy and Grounding/Mindfulness Skills,  Summary: Jamie Waters is a 25 y.o. female who presents with Bipolar II, Generalized Anxiety Disorder, and Grief.   Suicidal/Homicidal: No - without intent/plan  Therapist Response:  Jamie Waters met with clinician for an individual session. Jamie Waters discussed her psychiatric symptoms and her current life events . Jamie Waters was tearful as she shared that she was very depressed because she had a miscarriage yesterday. She shared that she did not want to come to therapy today but didn't feel she would get the support she needed else where. Clinician asked open ended questions and Jamie Waters shared her thoughts and emotions.  She shared that she felt like a failure and a burden. Clinician validated her feeling and also encouraged her to challenge them. Jamie Waters and clinician discussed her negative thoughts. Clinician asked questions about positives. Jamie Waters shared that while she had another miscarriage there was an improvement because the fetus had made it to her womb. She shared that she has an appointment with her doctor and that while she is grieving there is hope that she could have a child. Client and clinician discussed her grief. Client and clinician discussed her belief about lack of support. Clinician asked open ended questions and Jamie Waters had the insight that by keeping her emotions to herself she does not allow those who care for her to do so. Jamie Waters identified a  few people that would be supportive and how she could share with them. Client and clinician reviewed some grounding and mindfulness techniques. Jamie Waters shared she felt a little better at the end of the session and was glad she came.   Plan: Return again in 2 weeks.  Diagnosis:     Axis I: Bipolar II, Generalized Anxiety Disorder, and Grief.   Jamie Waters A, LCSW 06/18/2016

## 2016-06-26 DIAGNOSIS — M359 Systemic involvement of connective tissue, unspecified: Secondary | ICD-10-CM | POA: Diagnosis not present

## 2016-06-26 DIAGNOSIS — I7300 Raynaud's syndrome without gangrene: Secondary | ICD-10-CM | POA: Diagnosis not present

## 2016-06-26 DIAGNOSIS — R768 Other specified abnormal immunological findings in serum: Secondary | ICD-10-CM | POA: Diagnosis not present

## 2016-07-02 ENCOUNTER — Ambulatory Visit (HOSPITAL_COMMUNITY): Payer: Self-pay | Admitting: Clinical

## 2016-07-03 DIAGNOSIS — Z6832 Body mass index (BMI) 32.0-32.9, adult: Secondary | ICD-10-CM | POA: Diagnosis not present

## 2016-07-03 DIAGNOSIS — O039 Complete or unspecified spontaneous abortion without complication: Secondary | ICD-10-CM | POA: Diagnosis not present

## 2016-07-09 ENCOUNTER — Ambulatory Visit (INDEPENDENT_AMBULATORY_CARE_PROVIDER_SITE_OTHER): Payer: BLUE CROSS/BLUE SHIELD | Admitting: Clinical

## 2016-07-09 DIAGNOSIS — F411 Generalized anxiety disorder: Secondary | ICD-10-CM

## 2016-07-09 DIAGNOSIS — F4321 Adjustment disorder with depressed mood: Secondary | ICD-10-CM

## 2016-07-09 DIAGNOSIS — F4320 Adjustment disorder, unspecified: Secondary | ICD-10-CM | POA: Diagnosis not present

## 2016-07-09 DIAGNOSIS — F3181 Bipolar II disorder: Secondary | ICD-10-CM

## 2016-07-09 NOTE — Progress Notes (Signed)
   THERAPIST PROGRESS NOTE  Session Time: 3:30 - 4:30   Participation Level: Active  Behavioral Response: CasualAlertDepressed  Type of Therapy: Individual Therapy  Treatment Goals addressed: Improve Psychiatric Symptoms, decrease anxiety and irrational worry (decrease panic attacks, decrease stress), Improve unhelpful thought patterns, emotional regulation skills (stress management, anger management),  healthy coping skills  Interventions: Motivational Interviewing, Cognitive Behavior Therapy and Mindfulness Skills,  Summary: Jamie Waters is Waters 25 y.o. female who presents with Bipolar II, Generalized Anxiety Disorder, and Grief.   Suicidal/Homicidal: No - without intent/plan  Therapist Response:  Jamie Waters met with clinician for an individual session. Jamie Waters discussed her psychiatric symptoms and her current life events. Jamie Waters shared that she has been feeling anxious and overwhelmed. 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 She will be in Waters brace for 6-8 weeks. Jamie Waters was tearful when she shared that this has doubled her work load and stress level sense her Jamie Waters is upset about not being able to move around. Client and clinician discussed some stress reduction techniques. Client and clinician discussed how to incorporate her stress reduction techniques into her care taking routine. Jamie Waters shared some ruminating thoughts she has been having.  Client and clinician discussed the cost and benefits of the ruminating thoughts. Client and clinician discussed how to interrupt and redirect her thoughts. Jamie Waters shared that she had the follow up appointment after her miscarriage. She shared that she is both hopeful and frightened about getting pregnant. She cried when she spoke about getting pregnant and about friends and family that are currently pregnant. Clinician asked open ended questions and Jamie Waters recognized that she was catastrophizing.  Client and clinician discussed how she felt when she thought her catastrophic thoughts. Jamie Waters had the insight that she was reacting if her thoughts were true. Client and clinician discussed mindfulness. Client and clinician discussed how to challenge her catastrophic thoughts and how to challenge and change them  Plan: Return again in 2 weeks.  Diagnosis:     Axis I: Bipolar II, Generalized Anxiety Disorder, and Grief    Jamie Nam A, LCSW 07/09/2016

## 2016-07-12 ENCOUNTER — Encounter (HOSPITAL_COMMUNITY): Payer: Self-pay | Admitting: Clinical

## 2016-07-23 ENCOUNTER — Ambulatory Visit (HOSPITAL_COMMUNITY): Payer: Self-pay | Admitting: Clinical

## 2016-08-03 DIAGNOSIS — M545 Low back pain: Secondary | ICD-10-CM | POA: Diagnosis not present

## 2016-08-03 DIAGNOSIS — R112 Nausea with vomiting, unspecified: Secondary | ICD-10-CM | POA: Diagnosis not present

## 2016-08-06 ENCOUNTER — Ambulatory Visit (INDEPENDENT_AMBULATORY_CARE_PROVIDER_SITE_OTHER): Payer: BLUE CROSS/BLUE SHIELD | Admitting: Clinical

## 2016-08-06 DIAGNOSIS — F4321 Adjustment disorder with depressed mood: Secondary | ICD-10-CM

## 2016-08-06 DIAGNOSIS — F4320 Adjustment disorder, unspecified: Secondary | ICD-10-CM

## 2016-08-06 DIAGNOSIS — F411 Generalized anxiety disorder: Secondary | ICD-10-CM | POA: Diagnosis not present

## 2016-08-06 DIAGNOSIS — F3181 Bipolar II disorder: Secondary | ICD-10-CM | POA: Diagnosis not present

## 2016-08-06 NOTE — Progress Notes (Signed)
   THERAPIST PROGRESS NOTE  Session Time: 11:55 - 12:55  Participation Level: Active  Behavioral Response: CasualAlertAnxious  Type of Therapy: Individual Therapy  Treatment Goals addressed: Improve Psychiatric Symptoms, decrease anxiety and irrational worry (decrease panic attacks, decrease stress), Improve unhelpful thought patterns, emotional regulation skills (stress management, ), controlled behavior, moderate speech, deliberate thought process, healthy coping skills  Interventions: Motivational Interviewing, Cognitive Behavior Therapy and Grounding/Mindfulness Skills,   Summary: Jenah Vanasten is a 25 y.o. female who presents with Bipolar II, Generalized Anxiety Disorder, and Grief.   Suicidal/Homicidal: No - without intent/plan  Therapist Response:  Caryl Pina met with clinician for an individual session. Chariti discussed her psychiatric symptoms, her current life events and her homework. Lizette shared she has been up and down emotionally. Clinician asked open ended questions.  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 She shared about her successes and her challenges. Milly shared about some of the negative thoughts she struggled with. Clinician asked open ended questions and Bren identified the negative thoughts, her emotions, the evidence for and against the negative thoughts, she was then able to formulate healthier alternative thoughts. Client and clinician discussed how her actions would change if she believed the healthier alternative thoughts. Carmeline shared that she wants to reduce her stress. She shared that she wants to try to get pregnant. Client and clinician reviewed and discussed mindfulness and grounding techniques as well as other stress reduction techniques.   Plan: Return again in 2 weeks.  Diagnosis:     Axis I: Bipolar II, Generalized Anxiety Disorder, and Grief.    Cherish Runde A, LCSW 08/06/2016

## 2016-08-08 ENCOUNTER — Encounter (HOSPITAL_COMMUNITY): Payer: Self-pay | Admitting: Clinical

## 2016-08-08 DIAGNOSIS — L309 Dermatitis, unspecified: Secondary | ICD-10-CM | POA: Diagnosis not present

## 2016-08-10 DIAGNOSIS — K5904 Chronic idiopathic constipation: Secondary | ICD-10-CM | POA: Diagnosis not present

## 2016-08-16 ENCOUNTER — Encounter: Payer: Self-pay | Admitting: Nurse Practitioner

## 2016-08-16 ENCOUNTER — Ambulatory Visit (INDEPENDENT_AMBULATORY_CARE_PROVIDER_SITE_OTHER): Payer: BLUE CROSS/BLUE SHIELD | Admitting: Nurse Practitioner

## 2016-08-16 VITALS — BP 110/74 | HR 95 | Temp 98.6°F | Ht 60.0 in | Wt 163.0 lb

## 2016-08-16 DIAGNOSIS — S0340XA Sprain of jaw, unspecified side, initial encounter: Secondary | ICD-10-CM

## 2016-08-16 NOTE — Patient Instructions (Signed)
Jaw Range of Motion Exercises Introduction Jaw range of motion exercises are exercises that help your jaw to move better. These exercises can help to prevent:  Difficulty opening your mouth.  Pain in your jaw while it is both open and closed. What should I be careful of when doing jaw exercises? Make sure that you only do jaw exercises as directed by your health care provider. You should only move your jaw as far as it can go in each direction, if told to do so by your health care provider. Do not move your jaw into positions that cause you any pain. What exercises should I do?  Stick your jaw forward. Hold this position for 1-2 seconds. Allow your jaw to return to its normal position and rest it there for 1-2 seconds. Do this exercise 8 times.  Stand or sit in front of a mirror. Place your tongue on the roof of your mouth, just behind your top teeth. Slowly open and close your jaw, keeping your tongue on the roof of your mouth. While you open and close your mouth, try to keep your jaw from moving toward one side or the other. Repeat this 8 times.  Move your jaw right. Hold this position for 1-2 seconds. Allow your jaw to return to its normal position, and rest it there for 1-2 seconds. Do this exercise 8 times.  Move your jaw left. Hold this position for 1-2 seconds. Allow your jaw to return to its normal position, and rest it there for 1-2 seconds. Do this exercise 8 times.  Open your mouth as far as it is can comfortably go. Hold this position for 1-2 seconds. Then close your mouth and rest for 1-2 seconds. Do this exercise 8 times.  Move your jaw in a circular motion, starting toward the right (clockwise). Repeat this 8 times.  Move your jaw in a circular motion, starting toward the left (counterclockwise). Repeat this 8 times. Apply moist heat packs or ice packs to your jaw before or after performing your exercises as directed by your health care provider. What else can I do? Avoid the  following, if they cause jaw pain or they increase your jaw pain:  Chewing gum.  Clenching your jaw or teeth or keeping tension in your jaw muscles.  Leaning on your jaw, such as resting your jaw in your hand while leaning on a desk. This information is not intended to replace advice given to you by your health care provider. Make sure you discuss any questions you have with your health care provider. Document Released: 08/02/2008 Document Revised: 01/26/2016 Document Reviewed: 07/21/2014  2017 [employer]

## 2016-08-16 NOTE — Progress Notes (Signed)
   Subjective:    Patient ID: Jamie Waters, female    DOB: 12-07-1990, 25 y.o.   MRN: [id number]  HPI Patient comes in today C/o her jaw popping after yawning last night. Every since she yawned her jaw is stiff and her right ear is hurting. She says her jaw pops frequently everyday but has never hurt.  * [redacted] weeks pregnant  Review of Systems  Constitutional: Negative.   Respiratory: Negative.   Cardiovascular: Negative.   Gastrointestinal: Negative.   Genitourinary: Negative.   Neurological: Negative.   Psychiatric/Behavioral: Negative.   All other systems reviewed and are negative.      Objective:   Physical Exam  Constitutional: She is oriented to person, place, and time. She appears well-developed and well-nourished. No distress.  HENT:  Right Ear: External ear normal.  Left Ear: External ear normal.  Nose: Nose normal.  Mouth/Throat: Oropharynx is clear and moist.  Limited ROM of right jaw due to pain on opening. Mild point tenderness right jaw.  Neurological: She is alert and oriented to person, place, and time.  Skin: Skin is warm.  Psychiatric: She has a normal mood and affect. Her behavior is normal. Judgment and thought content normal.    BP 110/74   Pulse 95   Temp 98.6 F (37 C) (Oral)   Ht 5' (1.524 m)   Wt 163 lb (73.9 kg)   LMP 05/06/2016   BMI 31.83 kg/m        Assessment & Plan:  1. Sprain of temporomandibular joint, initial encounter Asper cream topically Moist heat Tylenol RTO prn  Mary-Margaret Daphine DeutscherMartin, FNP

## 2016-08-19 ENCOUNTER — Inpatient Hospital Stay (HOSPITAL_COMMUNITY): Payer: BLUE CROSS/BLUE SHIELD

## 2016-08-19 ENCOUNTER — Encounter (HOSPITAL_COMMUNITY): Payer: Self-pay | Admitting: *Deleted

## 2016-08-19 ENCOUNTER — Inpatient Hospital Stay (HOSPITAL_COMMUNITY)
Admission: AD | Admit: 2016-08-19 | Discharge: 2016-08-19 | Disposition: A | Discharge status: Home / Self Care | Payer: BLUE CROSS/BLUE SHIELD | Source: Ambulatory Visit | Attending: Obstetrics and Gynecology | Admitting: Obstetrics and Gynecology

## 2016-08-19 DIAGNOSIS — O99611 Diseases of the digestive system complicating pregnancy, first trimester: Secondary | ICD-10-CM | POA: Insufficient documentation

## 2016-08-19 DIAGNOSIS — Z3A01 Less than 8 weeks gestation of pregnancy: Secondary | ICD-10-CM | POA: Insufficient documentation

## 2016-08-19 DIAGNOSIS — Z9079 Acquired absence of other genital organ(s): Secondary | ICD-10-CM

## 2016-08-19 DIAGNOSIS — N760 Acute vaginitis: Secondary | ICD-10-CM | POA: Insufficient documentation

## 2016-08-19 DIAGNOSIS — M545 Low back pain: Secondary | ICD-10-CM | POA: Insufficient documentation

## 2016-08-19 DIAGNOSIS — O99511 Diseases of the respiratory system complicating pregnancy, first trimester: Secondary | ICD-10-CM | POA: Insufficient documentation

## 2016-08-19 DIAGNOSIS — K5909 Other constipation: Secondary | ICD-10-CM | POA: Insufficient documentation

## 2016-08-19 DIAGNOSIS — O26899 Other specified pregnancy related conditions, unspecified trimester: Secondary | ICD-10-CM

## 2016-08-19 DIAGNOSIS — R102 Pelvic and perineal pain: Secondary | ICD-10-CM | POA: Diagnosis not present

## 2016-08-19 DIAGNOSIS — M797 Fibromyalgia: Secondary | ICD-10-CM | POA: Insufficient documentation

## 2016-08-19 DIAGNOSIS — Z888 Allergy status to other drugs, medicaments and biological substances status: Secondary | ICD-10-CM | POA: Insufficient documentation

## 2016-08-19 DIAGNOSIS — Z8759 Personal history of other complications of pregnancy, childbirth and the puerperium: Secondary | ICD-10-CM

## 2016-08-19 DIAGNOSIS — B9689 Other specified bacterial agents as the cause of diseases classified elsewhere: Secondary | ICD-10-CM | POA: Insufficient documentation

## 2016-08-19 DIAGNOSIS — O26891 Other specified pregnancy related conditions, first trimester: Secondary | ICD-10-CM | POA: Diagnosis not present

## 2016-08-19 DIAGNOSIS — Z8742 Personal history of other diseases of the female genital tract: Secondary | ICD-10-CM

## 2016-08-19 DIAGNOSIS — R109 Unspecified abdominal pain: Secondary | ICD-10-CM

## 2016-08-19 DIAGNOSIS — R1030 Lower abdominal pain, unspecified: Secondary | ICD-10-CM | POA: Insufficient documentation

## 2016-08-19 DIAGNOSIS — O99281 Endocrine, nutritional and metabolic diseases complicating pregnancy, first trimester: Secondary | ICD-10-CM | POA: Insufficient documentation

## 2016-08-19 DIAGNOSIS — Z3491 Encounter for supervision of normal pregnancy, unspecified, first trimester: Secondary | ICD-10-CM

## 2016-08-19 DIAGNOSIS — J45909 Unspecified asthma, uncomplicated: Secondary | ICD-10-CM | POA: Insufficient documentation

## 2016-08-19 DIAGNOSIS — O23591 Infection of other part of genital tract in pregnancy, first trimester: Secondary | ICD-10-CM | POA: Insufficient documentation

## 2016-08-19 DIAGNOSIS — O9989 Other specified diseases and conditions complicating pregnancy, childbirth and the puerperium: Secondary | ICD-10-CM | POA: Insufficient documentation

## 2016-08-19 DIAGNOSIS — O3680X0 Pregnancy with inconclusive fetal viability, not applicable or unspecified: Secondary | ICD-10-CM

## 2016-08-19 DIAGNOSIS — E282 Polycystic ovarian syndrome: Secondary | ICD-10-CM | POA: Insufficient documentation

## 2016-08-19 DIAGNOSIS — Z79899 Other long term (current) drug therapy: Secondary | ICD-10-CM | POA: Insufficient documentation

## 2016-08-19 HISTORY — DX: Bipolar II disorder: F31.81

## 2016-08-19 LAB — CBC
HCT: 38 % (ref 36.0–46.0)
Hemoglobin: 12.9 g/dL (ref 12.0–15.0)
MCH: 30.6 pg (ref 26.0–34.0)
MCHC: 33.9 g/dL (ref 30.0–36.0)
MCV: 90.3 fL (ref 78.0–100.0)
Platelets: 322 10*3/uL (ref 150–400)
RBC: 4.21 MIL/uL (ref 3.87–5.11)
RDW: 13.3 % (ref 11.5–15.5)
WBC: 8.1 10*3/uL (ref 4.0–10.5)

## 2016-08-19 LAB — URINALYSIS, ROUTINE W REFLEX MICROSCOPIC
Bilirubin Urine: NEGATIVE
Glucose, UA: NEGATIVE mg/dL
Hgb urine dipstick: NEGATIVE
Ketones, ur: NEGATIVE mg/dL
Nitrite: NEGATIVE
Protein, ur: NEGATIVE mg/dL
Specific Gravity, Urine: 1.008 (ref 1.005–1.030)
pH: 8 (ref 5.0–8.0)

## 2016-08-19 LAB — WET PREP, GENITAL
Sperm: NONE SEEN
Trich, Wet Prep: NONE SEEN
Yeast Wet Prep HPF POC: NONE SEEN

## 2016-08-19 LAB — POCT PREGNANCY, URINE: Preg Test, Ur: POSITIVE — AB

## 2016-08-19 LAB — HCG, QUANTITATIVE, PREGNANCY: hCG, Beta Chain, Quant, S: 97 m[IU]/mL — ABNORMAL HIGH (ref <5)

## 2016-08-19 MED ORDER — METRONIDAZOLE 500 MG PO TABS: 500.0000 mg | ORAL_TABLET | Freq: Two times a day (BID) | ORAL | 0 refills | Status: DC

## 2016-08-19 NOTE — MAU Note (Signed)
Pt states that she had a miscarriage in October she also has had 2 ectopic pregnancies in the past. Pt reports pain that in intermittent and comes in waves with back pain and lower abdominal pain. Pt denies bleeding. LMP November 17tth. Pt reports increase in vaginal discharge with a "faint musty" odor

## 2016-08-19 NOTE — MAU Note (Signed)
Stated having back pain last night, more so on left side.  Has been having cramping in lower abd since Friday.  Shooting pain in LLQ.  +HPT last Wed.  [facility], [facility].

## 2016-08-19 NOTE — Discharge Instructions (Signed)
Abdominal Pain During Pregnancy °Abdominal pain is common in pregnancy. Most of the time, it does not cause harm. There are many causes of abdominal pain. Some causes are more serious than others and sometimes the cause is not known. Abdominal pain can be a sign that something is very wrong with the pregnancy or the pain may have nothing to do with the pregnancy. Always tell your health care provider if you have any abdominal pain. °Follow these instructions at home: °· Do not have sex or put anything in your vagina until your symptoms go away completely. °· Watch your abdominal pain for any changes. °· Get plenty of rest until your pain improves. °· Drink enough fluid to keep your urine clear or pale yellow. °· Take over-the-counter or prescription medicines only as told by your health care provider. °· Keep all follow-up visits as told by your health care provider. This is important. °Contact a health care provider if: °· You have a fever. °· Your pain gets worse or you have cramping. °· Your pain continues after resting. °Get help right away if: °· You are bleeding, leaking fluid, or passing tissue from the vagina. °· You have vomiting or diarrhea that does not go away. °· You have painful or bloody urination. °· You notice a decrease in your baby's movements. °· You feel very weak or faint. °· You have shortness of breath. °· You develop a severe headache with abdominal pain. °· You have abnormal vaginal discharge with abdominal pain. °This information is not intended to replace advice given to you by your health care provider. Make sure you discuss any questions you have with your health care provider. °Document Released: 08/20/2005 Document Revised: 05/31/2016 Document Reviewed: 03/19/2013 °Elsevier Interactive Patient Education © 2017 [employer] ° ° °Bacterial Vaginosis °Bacterial vaginosis is a vaginal infection that occurs when the normal balance of bacteria in the vagina is disrupted. It results from an  overgrowth of certain bacteria. This is the most common vaginal infection among women ages 15-44. °Because bacterial vaginosis increases your risk for STIs (sexually transmitted infections), getting treated can help reduce your risk for chlamydia, gonorrhea, herpes, and HIV (human immunodeficiency virus). Treatment is also important for preventing complications in pregnant women, because this condition can cause an early (premature) delivery. °What are the causes? °This condition is caused by an increase in harmful bacteria that are normally present in small amounts in the vagina. However, the reason that the condition develops is not fully understood. °What increases the risk? °The following factors may make you more likely to develop this condition: °· Having a new sexual partner or multiple sexual partners. °· Having unprotected sex. °· Douching. °· Having an intrauterine device (IUD). °· Smoking. °· Drug and alcohol abuse. °· Taking certain antibiotic medicines. °· Being pregnant. °You cannot get bacterial vaginosis from toilet seats, bedding, swimming pools, or contact with objects around you. °What are the signs or symptoms? °Symptoms of this condition include: °· Grey or white vaginal discharge. The discharge can also be watery or foamy. °· A fish-like odor with discharge, especially after sexual intercourse or during menstruation. °· Itching in and around the vagina. °· Burning or pain with urination. °Some women with bacterial vaginosis have no signs or symptoms. °How is this diagnosed? °This condition is diagnosed based on: °· Your medical history. °· A physical exam of the vagina. °· Testing a sample of vaginal fluid under a microscope to look for a large amount of bad bacteria or   abnormal cells. Your health care provider may use a cotton swab or a small wooden spatula to collect the sample. °How is this treated? °This condition is treated with antibiotics. These may be given as a pill, a vaginal cream,  or a medicine that is put into the vagina (suppository). If the condition comes back after treatment, a second round of antibiotics may be needed. °Follow these instructions at home: °Medicines °· Take over-the-counter and prescription medicines only as told by your health care provider. °· Take or use your antibiotic as told by your health care provider. Do not stop taking or using the antibiotic even if you start to feel better. °General instructions °· If you have a female sexual partner, tell her that you have a vaginal infection. She should see her health care provider and be treated if she has symptoms. If you have a female sexual partner, he does not need treatment. °· During treatment: °¨ Avoid sexual activity until you finish treatment. °¨ Do not douche. °¨ Avoid alcohol as directed by your health care provider. °¨ Avoid breastfeeding as directed by your health care provider. °· Drink enough water and fluids to keep your urine clear or pale yellow. °· Keep the area around your vagina and rectum clean. °¨ Wash the area daily with warm water. °¨ Wipe yourself from front to back after using the toilet. °· Keep all follow-up visits as told by your health care provider. This is important. °How is this prevented? °· Do not douche. °· Wash the outside of your vagina with warm water only. °· Use protection when having sex. This includes latex condoms and dental dams. °· Limit how many sexual partners you have. To help prevent bacterial vaginosis, it is best to have sex with just one partner (monogamous). °· Make sure you and your sexual partner are tested for STIs. °· Wear cotton or cotton-lined underwear. °· Avoid wearing tight pants and pantyhose, especially during summer. °· Limit the amount of alcohol that you drink. °· Do not use any products that contain nicotine or tobacco, such as cigarettes and e-cigarettes. If you need help quitting, ask your health care provider. °· Do not use illegal drugs. °Where to find  more information: °· Centers for Disease Control and Prevention: www.cdc.gov/std °· American Sexual Health Association (ASHA): www.ashastd.org °· U.S. Department of Health and Human Services, Office on Women's Health: www.womenshealth.gov/ or https://www.womenshealth.gov/a-z-topics/bacterial-vaginosis °Contact a health care provider if: °· Your symptoms do not improve, even after treatment. °· You have more discharge or pain when urinating. °· You have a fever. °· You have pain in your abdomen. °· You have pain during sex. °· You have vaginal bleeding between periods. °Summary °· Bacterial vaginosis is a vaginal infection that occurs when the normal balance of bacteria in the vagina is disrupted. °· Because bacterial vaginosis increases your risk for STIs (sexually transmitted infections), getting treated can help reduce your risk for chlamydia, gonorrhea, herpes, and HIV (human immunodeficiency virus). Treatment is also important for preventing complications in pregnant women, because the condition can cause an early (premature) delivery. °· This condition is treated with antibiotic medicines. These may be given as a pill, a vaginal cream, or a medicine that is put into the vagina (suppository). °This information is not intended to replace advice given to you by your health care provider. Make sure you discuss any questions you have with your health care provider. °Document Released: 08/20/2005 Document Revised: 05/05/2016 Document Reviewed: 05/05/2016 °Elsevier Interactive Patient Education ©   2017 Elsevier Inc. ° °

## 2016-08-19 NOTE — MAU Provider Note (Signed)
Chief Complaint: Abdominal Pain and Back Pain   First Provider Initiated Contact with Patient 08/19/16 1814     SUBJECTIVE HPI: Jamie Waters is a 25 y.o. G4P0030 at [redacted]w[redacted]d by LMP who presents to [facility] reporting low abd and low back pain. Hx right ectopic pregnancy x 2. First one Tx'd w/ MTX, second one Tx'd w/ right salpingectomy.   States she was seen [facility] on Thursday, December 14. Had quantitative hCG drawn which was 25.   Location: Left low abdomen and left lower quadrant Quality: Sharp Severity: 3/10 on pain scale Duration: 3 days Course: Unchanged Context: Early pregnancy, history of ectopic pregnancy, history of ovarian cysts Timing: Intermittent Modifying factors: None. Hasn't tried anything for the pain. Associated signs and symptoms: Positive for chronic constipation (no change). Negative for fever, chills, vaginal bleeding, vaginal discharge, urinary complaints, diarrhea, nausea, vomiting.  Past Medical History:  Diagnosis Date  . Arthritis   . Asthma   . Bipolar II disorder (HCC)   . Ectopic fetus   . Fibromyalgia   . PCOS (polycystic ovarian syndrome) 2015  . Raynauds syndrome 2015   OB History  Gravida Para Term Preterm AB Living  4       3 0  SAB TAB Ectopic Multiple Live Births  1   2   0    # Outcome Date GA Lbr Len/2nd Weight Sex Delivery Anes PTL Lv  4 Current           3 SAB           2 Ectopic           1 Ectopic              Past Surgical History:  Procedure Laterality Date  . cyst removed from throat    . DIAGNOSTIC LAPAROSCOPY WITH REMOVAL OF ECTOPIC PREGNANCY    . LAPAROSCOPY N/A 09/11/2015   Procedure: LAPAROSCOPY OPERATIVE, Right Salpingectomy with Removal Ectopic Pregnancy;  Surgeon: Waynard Reeds, MD;  Location: [facility];  Service: Gynecology;  Laterality: N/A;  . THYROIDECTOMY, PARTIAL  2010   Social History   Social History  . Marital status: Married    Spouse name: N/A  . Number of children: N/A  .  Years of education: N/A   Occupational History  . Not on file.   Social History Main Topics  . Smoking status: Never Smoker  . Smokeless tobacco: Never Used  . Alcohol use No  . Drug use: No  . Sexual activity: Yes    Birth control/ protection: None   Other Topics Concern  . Not on file   Social History Narrative  . No narrative on file   No current facility-administered medications on file prior to encounter.    Current Outpatient Prescriptions on File Prior to Encounter  Medication Sig Dispense Refill  . albuterol (PROVENTIL HFA;VENTOLIN HFA) 108 (90 Base) MCG/ACT inhaler Inhale 1-2 puffs into the lungs every 6 (six) hours as needed for wheezing or shortness of breath.    . hydroxychloroquine (PLAQUENIL) 200 MG tablet Take 400 mg by mouth at bedtime.     . Prenatal Vit-Fe Fumarate-FA (MULTIVITAMIN-PRENATAL) 27-0.8 MG TABS tablet Take 1 tablet by mouth at bedtime.      Allergies  Allergen Reactions  . Cymbalta [Duloxetine Hcl] Shortness Of Breath  . Norvasc [Amlodipine Besylate] Shortness Of Breath and Other (See Comments)    Reaction:  Tightness in chest     I have reviewed the  past Medical Hx, Surgical Hx, Social Hx, Allergies and Medications.   Review of Systems  Constitutional: Negative for appetite change, chills and fever.  Gastrointestinal: Positive for abdominal pain and constipation (chronic). Negative for abdominal distention, diarrhea, nausea and vomiting.  Genitourinary: Negative for difficulty urinating, dysuria, flank pain, frequency, hematuria, pelvic pain, urgency, vaginal bleeding and vaginal discharge.  Musculoskeletal: Positive for back pain.  Neurological: Negative for dizziness.    OBJECTIVE Patient Vitals for the past 24 hrs:  BP Temp Temp src Pulse Resp Height Weight  08/19/16 1600 113/70 98.4 F (36.9 C) Oral 97 16 5\' 1"  (1.549 m) 163 lb 6.4 oz (74.1 kg)   Constitutional: Well-developed, well-nourished female in no acute distress.   Cardiovascular: normal rate Respiratory: normal rate and effort.  GI: Abd soft, non-tender. Pos BS x 4 MS: Extremities nontender, no edema, normal ROM. Low back nontender. Neurologic: Alert and oriented x 4.  GU: Neg CVAT.  PELVIC EXAM: NEFG, physiologic discharge, no blood noted, cervix closed; uterus normal size, no adnexal tenderness or masses. No CMT.  LAB RESULTS Results for orders placed or performed during the hospital encounter of 08/19/16 (from the past 24 hour(s))  Urinalysis, Routine w reflex microscopic     Status: Abnormal   Collection Time: 08/19/16  4:16 PM  Result Value Ref Range   Color, Urine STRAW (A) YELLOW   APPearance CLEAR CLEAR   Specific Gravity, Urine 1.008 1.005 - 1.030   pH 8.0 5.0 - 8.0   Glucose, UA NEGATIVE NEGATIVE mg/dL   Hgb urine dipstick NEGATIVE NEGATIVE   Bilirubin Urine NEGATIVE NEGATIVE   Ketones, ur NEGATIVE NEGATIVE mg/dL   Protein, ur NEGATIVE NEGATIVE mg/dL   Nitrite NEGATIVE NEGATIVE   Leukocytes, UA MODERATE (A) NEGATIVE   RBC / HPF 0-5 0 - 5 RBC/hpf   WBC, UA 0-5 0 - 5 WBC/hpf   Bacteria, UA RARE (A) NONE SEEN   Squamous Epithelial / LPF 0-5 (A) NONE SEEN  Pregnancy, urine POC     Status: Abnormal   Collection Time: 08/19/16  4:16 PM  Result Value Ref Range   Preg Test, Ur POSITIVE (A) NEGATIVE  hCG, quantitative, pregnancy     Status: Abnormal   Collection Time: 08/19/16  4:34 PM  Result Value Ref Range   hCG, Beta Chain, Quant, S 97 (H) <5 mIU/mL  CBC     Status: None   Collection Time: 08/19/16  4:34 PM  Result Value Ref Range   WBC 8.1 4.0 - 10.5 K/uL   RBC 4.21 3.87 - 5.11 MIL/uL   Hemoglobin 12.9 12.0 - 15.0 g/dL   HCT 16.138.0 09.636.0 - 04.546.0 %   MCV 90.3 78.0 - 100.0 fL   MCH 30.6 26.0 - 34.0 pg   MCHC 33.9 30.0 - 36.0 g/dL   RDW 40.913.3 81.111.5 - 91.415.5 %   Platelets 322 150 - 400 K/uL  Wet prep, genital     Status: Abnormal   Collection Time: 08/19/16  4:41 PM  Result Value Ref Range   Yeast Wet Prep HPF POC NONE SEEN NONE  SEEN   Trich, Wet Prep NONE SEEN NONE SEEN   Clue Cells Wet Prep HPF POC PRESENT (A) NONE SEEN   WBC, Wet Prep HPF POC MODERATE (A) NONE SEEN   Sperm NONE SEEN     IMAGING [place] Ob Comp Less 14 Wks  Result Date: 08/19/2016 CLINICAL DATA:  Pregnant patient. History of 2 ectopic pregnancies in the past. By report, the right  fallopian tube has been removed. By last menstrual period, the patient is 4 weeks and 2 days pregnant with pain. EXAM: OBSTETRIC <14 WK US AND TRANSVAGINAL OB US TECHNIQUE: Both transabdominal and transvaginal ultrasound examinations were performed for complete evaluation of the gestation as well as the maternal uterus, adnexal regions, and pelvic cul-de-sac. Transvaginal technique was performed to assess early pregnancy. COMPARISON:  None. FINDINGS: Intrauterine gestational sac: None Yolk sac:  Not applicable Embryo:  Not applicable Cardiac Activity: Not applicable Heart Rate:   bpm MSD:   mm    w     d CRL:    mm    w    d                  US EDC: Subchorionic hemorrhage:  Not applicable Maternal uterus/adnexae: Uterus is normal in appearance. The endometrium is somewhat thickened measuring 2 cm consistent with the history of pregnancy. No gestational sac is identified. The left ovary measures 3.1 x 2.3 x 4.3 cm. A complex thick walled cystic structure is seen at the periphery of the left ovary. This is thought to be within the ovary itself with peripheral blood flow. The right ovary measures 5 x 3.0 cm. There is also a thick walled cystic structure containing internal echoes at the periphery with peripheral blood flow. There is a small amount of fluid in the cul-de-sac and adjacent to the right ovary. Of note, the patient had similar appearing cystic structures in the ovaries in October of 2017. Both cystic structures appear to be intra-ovarian. IMPRESSION: 1. No intrauterine pregnancy is identified. As a result, ectopic pregnancy, early pregnancy, and recent miscarriage are all  possible. Recommend close clinical follow-up and follow-up imaging/labs as clinically warranted. Electronically Signed   By: Gerome Samavid  Williams III M.D   On: 08/19/2016 17:50   [place] Ob Transvaginal  Result Date: 08/19/2016 CLINICAL DATA:  Pregnant patient. History of 2 ectopic pregnancies in the past. By report, the right fallopian tube has been removed. By last menstrual period, the patient is 4 weeks and 2 days pregnant with pain. EXAM: OBSTETRIC <14 WK US AND TRANSVAGINAL OB US TECHNIQUE: Both transabdominal and transvaginal ultrasound examinations were performed for complete evaluation of the gestation as well as the maternal uterus, adnexal regions, and pelvic cul-de-sac. Transvaginal technique was performed to assess early pregnancy. COMPARISON:  None. FINDINGS: Intrauterine gestational sac: None Yolk sac:  Not applicable Embryo:  Not applicable Cardiac Activity: Not applicable Heart Rate:   bpm MSD:   mm    w     d CRL:    mm    w    d                  US EDC: Subchorionic hemorrhage:  Not applicable Maternal uterus/adnexae: Uterus is normal in appearance. The endometrium is somewhat thickened measuring 2 cm consistent with the history of pregnancy. No gestational sac is identified. The left ovary measures 3.1 x 2.3 x 4.3 cm. A complex thick walled cystic structure is seen at the periphery of the left ovary. This is thought to be within the ovary itself with peripheral blood flow. The right ovary measures 5 x 3.0 cm. There is also a thick walled cystic structure containing internal echoes at the periphery with peripheral blood flow. There is a small amount of fluid in the cul-de-sac and adjacent to the right ovary. Of note, the patient had similar appearing cystic structures in the ovaries in October of  2017. Both cystic structures appear to be intra-ovarian. IMPRESSION: 1. No intrauterine pregnancy is identified. As a result, ectopic pregnancy, early pregnancy, and recent miscarriage are all possible.  Recommend close clinical follow-up and follow-up imaging/labs as clinically warranted. Electronically Signed   By: Gerome Sam III M.D   On: 08/19/2016 17:50    MAU COURSE CBC, Quant, ABO/Rh, ultrasound, wet prep and GC/chlamydia culture, UA  MDM Pain in early pregnancy with pregnancy of unknown anatomic location, but hemodynamically stable. Pain may be R/T ovarian cysts and/or BV. Dr. Dareen Piano notified of history, exam. Instructed patient to follow-up in office for repeat hCG.  ASSESSMENT 1. Pregnancy of unknown anatomic location   2. Abdominal pain affecting pregnancy, antepartum   3. History of ectopic pregnancy   4. History of unilateral salpingectomy   5. Ovarian cyst   6. BV (bacterial vaginosis)     PLAN Discharge home in stable condition. Ectopic and SAB precautions Rx metronidazole. Follow-up Information    Almon Hercules., MD Follow up.   Specialty:  Obstetrics and Gynecology Why:  Call office tomorrow to determine when to repeat bloodwork Contact information: [address] [place] [address] [phone]        [facility] [place] [facility]ERN[facility] ADMISSIONS Follow up.   Why:  as needed in emergencies Contact information: [address] [id number] mc [place] [address] [phone]         Allergies as of 08/19/2016      Reactions   Cymbalta [duloxetine Hcl] Shortness Of Breath   Norvasc [amlodipine Besylate] Shortness Of Breath, Other (See Comments)   Reaction:  Tightness in chest       Medication List    TAKE these medications   acetaminophen 500 MG tablet Commonly known as:  TYLENOL Take 500 mg by mouth every 6 (six) hours as needed for mild pain, moderate pain or headache.   albuterol 108 (90 Base) MCG/ACT inhaler Commonly known as:  PROVENTIL HFA;VENTOLIN HFA Inhale 1-2 puffs into the lungs every 6 (six) hours as needed for wheezing or shortness of breath.   docusate sodium 100 MG  capsule Commonly known as:  COLACE Take 300 mg by mouth at bedtime.   hydroxychloroquine 200 MG tablet Commonly known as:  PLAQUENIL Take 400 mg by mouth at bedtime.   metroNIDAZOLE 500 MG tablet Commonly known as:  FLAGYL Take 1 tablet (500 mg total) by mouth 2 (two) times daily.   multivitamin-prenatal 27-0.8 MG Tabs tablet Take 1 tablet by mouth at bedtime.        [place], [place] 08/19/2016  6:31 PM  4

## 2016-08-20 ENCOUNTER — Ambulatory Visit (HOSPITAL_COMMUNITY): Payer: Self-pay | Admitting: Clinical

## 2016-08-20 LAB — GC/CHLAMYDIA PROBE AMP (~~LOC~~) NOT AT ARMC
Chlamydia: NEGATIVE
Neisseria Gonorrhea: NEGATIVE

## 2016-08-21 DIAGNOSIS — N925 Other specified irregular menstruation: Secondary | ICD-10-CM | POA: Diagnosis not present

## 2016-08-23 ENCOUNTER — Inpatient Hospital Stay (HOSPITAL_COMMUNITY)
Admission: AD | Admit: 2016-08-23 | Discharge: 2016-08-24 | Disposition: A | Discharge status: Home / Self Care | Payer: BLUE CROSS/BLUE SHIELD | Source: Ambulatory Visit | Attending: Obstetrics and Gynecology | Admitting: Obstetrics and Gynecology

## 2016-08-23 ENCOUNTER — Inpatient Hospital Stay (HOSPITAL_COMMUNITY): Payer: BLUE CROSS/BLUE SHIELD

## 2016-08-23 DIAGNOSIS — R102 Pelvic and perineal pain: Secondary | ICD-10-CM | POA: Diagnosis not present

## 2016-08-23 DIAGNOSIS — Z3A00 Weeks of gestation of pregnancy not specified: Secondary | ICD-10-CM | POA: Diagnosis not present

## 2016-08-23 DIAGNOSIS — Z79899 Other long term (current) drug therapy: Secondary | ICD-10-CM | POA: Insufficient documentation

## 2016-08-23 DIAGNOSIS — O26891 Other specified pregnancy related conditions, first trimester: Secondary | ICD-10-CM | POA: Insufficient documentation

## 2016-08-23 DIAGNOSIS — O26851 Spotting complicating pregnancy, first trimester: Secondary | ICD-10-CM | POA: Diagnosis not present

## 2016-08-23 DIAGNOSIS — Z3A01 Less than 8 weeks gestation of pregnancy: Secondary | ICD-10-CM | POA: Diagnosis not present

## 2016-08-23 LAB — CBC
HCT: 37.4 % (ref 36.0–46.0)
Hemoglobin: 12.7 g/dL (ref 12.0–15.0)
MCH: 30.6 pg (ref 26.0–34.0)
MCHC: 34 g/dL (ref 30.0–36.0)
MCV: 90.1 fL (ref 78.0–100.0)
Platelets: 330 10*3/uL (ref 150–400)
RBC: 4.15 MIL/uL (ref 3.87–5.11)
RDW: 13.1 % (ref 11.5–15.5)
WBC: 10.1 10*3/uL (ref 4.0–10.5)

## 2016-08-23 LAB — HCG, QUANTITATIVE, PREGNANCY: hCG, Beta Chain, Quant, S: 528 m[IU]/mL — ABNORMAL HIGH (ref <5)

## 2016-08-23 NOTE — MAU Note (Signed)
Pt presents complaining of cramping and constant back ache with some pink spotting throughout the day. States she passed a grey clot when she went to the bathroom earlier today. Last intercourse a while ago.

## 2016-08-23 NOTE — MAU Provider Note (Signed)
History     CSN: [id number]  Arrival date and time: 08/23/16 2156   First Provider Initiated Contact with Patient 08/23/16 2209      Chief Complaint  Patient presents with  . Pelvic Pain   Jamie Waters is a 25 y.o. G4P0030 at [redacted]w[redacted]d who presents today with pelvic pain. She has had 2 ectopic pregnancies and a miscarriage in the past.  HCG levels 12/17: 97 12/19: 187  She states that the pain is worse today. She has an appointment tomorrow for repeat HCG.    Pelvic Pain  The patient's primary symptoms include pelvic pain. This is a new problem. The current episode started today. The problem occurs intermittently. The problem has been unchanged. Pain severity now: 7/10  The problem affects both sides. She is pregnant. Associated symptoms include abdominal pain, constipation and nausea. Pertinent negatives include no chills, diarrhea, dysuria, fever, frequency, urgency or vomiting. The vaginal bleeding is spotting. She has been passing clots (one small clot earlier tonight ). Nothing aggravates the symptoms. She has tried nothing for the symptoms. Menstrual history: LMP 07/20/16       Past Medical History:  Diagnosis Date  . Arthritis   . Asthma   . Bipolar II disorder (HCC)   . Ectopic fetus   . Fibromyalgia   . PCOS (polycystic ovarian syndrome) 2015  . Raynauds syndrome 2015    Past Surgical History:  Procedure Laterality Date  . cyst removed from throat    . DIAGNOSTIC LAPAROSCOPY WITH REMOVAL OF ECTOPIC PREGNANCY    . LAPAROSCOPY N/A 09/11/2015   Procedure: LAPAROSCOPY OPERATIVE, Right Salpingectomy with Removal Ectopic Pregnancy;  Surgeon: Waynard Reeds, MD;  Location: [facility];  Service: Gynecology;  Laterality: N/A;  . THYROIDECTOMY, PARTIAL  2010    Family History  Problem Relation Age of Onset  . Dementia Father   . Alcohol abuse Father   . Bipolar disorder Sister   . Autism spectrum disorder Sister   . Lupus Maternal Grandmother   . Cancer Maternal  Grandmother   . Cancer Paternal Grandfather     Social History  Substance Use Topics  . Smoking status: Never Smoker  . Smokeless tobacco: Never Used  . Alcohol use No    Allergies:  Allergies  Allergen Reactions  . Cymbalta [Duloxetine Hcl] Shortness Of Breath  . Norvasc [Amlodipine Besylate] Shortness Of Breath and Other (See Comments)    Reaction:  Tightness in chest     Prescriptions Prior to Admission  Medication Sig Dispense Refill Last Dose  . acetaminophen (TYLENOL) 500 MG tablet Take 500 mg by mouth every 6 (six) hours as needed for mild pain, moderate pain or headache.   08/18/2016 at 2300  . albuterol (PROVENTIL HFA;VENTOLIN HFA) 108 (90 Base) MCG/ACT inhaler Inhale 1-2 puffs into the lungs every 6 (six) hours as needed for wheezing or shortness of breath.   Past Month at Unknown time  . docusate sodium (COLACE) 100 MG capsule Take 300 mg by mouth at bedtime.   08/18/2016 at Unknown time  . hydroxychloroquine (PLAQUENIL) 200 MG tablet Take 400 mg by mouth at bedtime.    08/18/2016 at Unknown time  . metroNIDAZOLE (FLAGYL) 500 MG tablet Take 1 tablet (500 mg total) by mouth 2 (two) times daily. 14 tablet 0   . Prenatal Vit-Fe Fumarate-FA (MULTIVITAMIN-PRENATAL) 27-0.8 MG TABS tablet Take 1 tablet by mouth at bedtime.    08/18/2016 at Unknown time    Review of Systems  Constitutional: Negative for  chills and fever.  Gastrointestinal: Positive for abdominal pain, constipation and nausea. Negative for diarrhea and vomiting.  Genitourinary: Positive for pelvic pain. Negative for dysuria, frequency and urgency.   Physical Exam   Blood pressure 117/60, pulse 85, temperature 98 F (36.7 C), temperature source Oral, resp. rate 18, height 5\' 1"  (1.549 m), weight 164 lb 3.2 oz (74.5 kg), last menstrual period 07/20/2016, unknown if currently breastfeeding.  Physical Exam  Nursing note and vitals reviewed. Constitutional: She is oriented to person, place, and time. She appears  well-developed and well-nourished. No distress.  HENT:  Head: Normocephalic.  Cardiovascular: Normal rate and regular rhythm.   Respiratory: Effort normal.  GI: Soft. There is no tenderness. There is no rebound.  Neurological: She is alert and oriented to person, place, and time.  Skin: Skin is warm and dry.  Psychiatric: She has a normal mood and affect.   Results for orders placed or performed during the hospital encounter of 08/23/16 (from the past 24 hour(s))  CBC     Status: None   Collection Time: 08/23/16 10:19 PM  Result Value Ref Range   WBC 10.1 4.0 - 10.5 K/uL   RBC 4.15 3.87 - 5.11 MIL/uL   Hemoglobin 12.7 12.0 - 15.0 g/dL   HCT 40.937.4 81.136.0 - 91.446.0 %   MCV 90.1 78.0 - 100.0 fL   MCH 30.6 26.0 - 34.0 pg   MCHC 34.0 30.0 - 36.0 g/dL   RDW 78.213.1 95.611.5 - 21.315.5 %   Platelets 330 150 - 400 K/uL  hCG, quantitative, pregnancy     Status: Abnormal   Collection Time: 08/23/16 10:19 PM  Result Value Ref Range   hCG, Beta Chain, Quant, S 528 (H) <5 mIU/mL  [place] Ob Transvaginal  Result Date: 08/23/2016 CLINICAL DATA:  Cramping spotting and back pain history of right ectopic x2 EXAM: OBSTETRIC <14 WK US AND TRANSVAGINAL OB US TECHNIQUE: Both transabdominal and transvaginal ultrasound examinations were performed for complete evaluation of the gestation as well as the maternal uterus, adnexal regions, and pelvic cul-de-sac. Transvaginal technique was performed to assess early pregnancy. COMPARISON:  08/19/2016, 06/05/2016 FINDINGS: Intrauterine gestational sac: Not visualized Yolk sac:  Not visualized Embryo:  Not visualized Cardiac Activity: Not visualized Maternal uterus/adnexae: Fibroid in the left posterior wall measuring 1.2 x 1.2 x 1.3 cm. Right ovary measures 5.5 x 3.2 by 3.3 cm and the left ovary measures 3.7 x 2.3 by 2.2 cm. Again visualized within the left ovary is a slightly thick-walled cyst measuring 1.6 x 1.6 x 1.6 cm. Within the right ovary is a cystic structure measuring 2.8 by 3.1  x 3 cm with peripheral vascularity. Findings are grossly unchanged. Similar findings were suggested in October 2017. Trace free fluid in the cul-de-sac. IMPRESSION: 1. No intrauterine gestational sac, yolk sac or fetal pole is identified ; early pregnancy or ectopic remain in the differential as previously described. Continued close clinical monitoring and correlation with quantitative beta HCG is advised with repeat ultrasound as indicated. 2. Stable slightly thick-walled cysts within the bilateral ovaries. 3. Trace free fluid in the cul-de-sac. Electronically Signed   By: Jasmine PangKim  Fujinaga M.D.   On: 08/23/2016 23:49    MAU Course  Procedures  MDM 2307: D/W Dr. Tenny Crawoss will repeat US today, and call back with results.  0000: D/W Dr. Tenny Crawoss ok for DC home with pain medication. Will cancel appointment for HCG in the office tomorrow, but office will call to schedule appointment for US on Tuesday.  Assessment and Plan   1. Pelvic pain affecting pregnancy in first trimester, antepartum    DC home Comfort measures reviewed  1stTrimester precautions  Ectopic precautions RX: vicodin PRN #20  Return to MAU as needed FU with OB as planned  Follow-up Information    Almon HerculesOSS,KENDRA H., MD Follow up.   Specialty:  Obstetrics and Gynecology Contact information: [address] [place] [address] [phone]            Tawnya CrookHogan, Heather Donovan 08/23/2016, 10:10 PM

## 2016-08-24 DIAGNOSIS — R102 Pelvic and perineal pain: Secondary | ICD-10-CM | POA: Diagnosis not present

## 2016-08-24 DIAGNOSIS — O26891 Other specified pregnancy related conditions, first trimester: Secondary | ICD-10-CM | POA: Diagnosis not present

## 2016-08-24 DIAGNOSIS — F3181 Bipolar II disorder: Secondary | ICD-10-CM | POA: Insufficient documentation

## 2016-08-24 DIAGNOSIS — M359 Systemic involvement of connective tissue, unspecified: Secondary | ICD-10-CM | POA: Insufficient documentation

## 2016-08-24 DIAGNOSIS — Z3A01 Less than 8 weeks gestation of pregnancy: Secondary | ICD-10-CM | POA: Diagnosis not present

## 2016-08-24 DIAGNOSIS — Z79899 Other long term (current) drug therapy: Secondary | ICD-10-CM | POA: Diagnosis not present

## 2016-08-24 MED ORDER — HYDROCODONE-ACETAMINOPHEN 5-325 MG PO TABS: 1.0000 | ORAL_TABLET | ORAL | 0 refills | Status: DC | PRN

## 2016-08-24 NOTE — Discharge Instructions (Signed)
Ectopic Pregnancy °An ectopic pregnancy is when the fertilized egg attaches (implants) outside the uterus. Most ectopic pregnancies occur in one of the tubes where eggs travel from the ovary to the uterus (fallopian tubes), but the implanting can occur in other locations. In rare cases, ectopic pregnancies occur on the ovary, intestine, pelvis, abdomen, or cervix. In an ectopic pregnancy, the fertilized egg does not have the ability to develop into a normal, healthy baby. °A ruptured ectopic pregnancy is one in which tearing or bursting of a fallopian tube causes internal bleeding. Often, there is intense lower abdominal pain, and vaginal bleeding sometimes occurs. Having an ectopic pregnancy can be life-threatening. If this dangerous condition is not treated, it can lead to blood loss, shock, or even death. °What are the causes? °The most common cause of this condition is damage to one of the fallopian tubes. A fallopian tube may be narrowed or blocked, and that keeps the fertilized egg from reaching the uterus. °What increases the risk? °This condition is more likely to develop in women of childbearing age who have different levels of risk. The levels of risk can be divided into three categories. °High risk  °· You have gone through infertility treatment. °· You have had an ectopic pregnancy before. °· You have had surgery on the fallopian tubes, or another surgical procedure, such as an abortion. °· You have had surgery to have the fallopian tubes tied (tubal ligation). °· You have problems or diseases of the fallopian tubes. °· You have been exposed to diethylstilbestrol (DES). This medicine was used until 1971, and it had effects on babies whose mothers took the medicine. °· You become pregnant while using an IUD (intrauterine device) for birth control. °Moderate risk  °· You have a history of infertility. °· You have had an STI (sexually transmitted infection). °· You have a history of pelvic inflammatory  disease (PID). °· You have scarring from endometriosis. °· You have multiple sexual partners. °· You smoke. °Low risk  °· You have had pelvic surgery. °· You use vaginal douches. °· You became sexually active before age 18. °What are the signs or symptoms? °Common symptoms of this condition include normal pregnancy symptoms, such as missing a period, nausea, tiredness, abdominal pain, breast tenderness, and bleeding. However, ectopic pregnancy will have additional symptoms, such as: °· Pain with intercourse. °· Irregular vaginal bleeding or spotting. °· Cramping or pain on one side or in the lower abdomen. °· Fast heartbeat, low blood pressure, and sweating. °· Passing out while having a bowel movement. °Symptoms of a ruptured ectopic pregnancy and internal bleeding may include: °· Sudden, severe pain in the abdomen and pelvis. °· Dizziness, weakness, light-headedness, or fainting. °· Pain in the shoulder or neck area. °How is this diagnosed? °This condition is diagnosed by: °· A pelvic exam to locate pain or a mass in the abdomen. °· A pregnancy test. This blood test checks for the presence as well as the specific level of pregnancy hormone in the bloodstream. °· Ultrasound. This is performed if a pregnancy test is positive. In this test, a probe is inserted into the vagina. The probe will detect a fetus, possibly in a location other than the uterus. °· Taking a sample of uterus tissue (dilation and curettage, or D&C). °· Surgery to perform a visual exam of the inside of the abdomen using a thin, lighted tube that has a tiny camera on the end (laparoscope). °· Culdocentesis. This procedure involves inserting a needle at the   top of the vagina, behind the uterus. If blood is present in this area, it may indicate that a fallopian tube is torn. °How is this treated? °This condition is treated with medicine or surgery. °Medicine  °· An injection of a medicine (methotrexate) may be given to cause the pregnancy tissue to  be absorbed. This medicine may save your fallopian tube. It may be given if: °¨ The diagnosis is made early, with no signs of active bleeding. °¨ The fallopian tube has not ruptured. °¨ You are considered to be a good candidate for the medicine. °Usually, pregnancy hormone blood levels are checked after methotrexate treatment. This is to be sure that the medicine is effective. It may take 4-6 weeks for the pregnancy to be absorbed. Most pregnancies will be absorbed by 3 weeks. °Surgery  °· A laparoscope may be used to remove the pregnancy tissue. °· If severe internal bleeding occurs, a larger cut (incision) may be made in the lower abdomen (laparotomy) to remove the fetus and placenta. This is done to stop the bleeding. °· Part or all of the fallopian tube may be removed (salpingectomy) along with the fetus and placenta. The fallopian tube may also be repaired during the surgery. °· In very rare circumstances, removal of the uterus (hysterectomy) may be required. °· After surgery, pregnancy hormone testing may be done to be sure that there is no pregnancy tissue left. °Whether your treatment is medicine or surgery, you may receive a Rho (D) immune globulin shot to prevent problems with any future pregnancy. This shot may be given if: °· You are Rh-negative and the baby's father is Rh-positive. °· You are Rh-negative and you do not know the Rh type of the baby's father. °Follow these instructions at home: °· Rest and limit your activity after the procedure for as long as told by your health care provider. °· Until your health care provider says that it is safe: °¨ Do not lift anything that is heavier than 10 lb (4.5 kg), or the limit that your health care provider tells you. °¨ Avoid physical exercise and any movement that requires effort (is strenuous). °· To help prevent constipation: °¨ Eat a healthy diet that includes fruits, vegetables, and whole grains. °¨ Drink 6-8 glasses of water per day. °Get help right  away if: °· You develop worsening pain that is not relieved by medicine. °· You have: °¨ A fever or chills. °¨ Vaginal bleeding. °¨ Redness and swelling at the incision site. °¨ Nausea and vomiting. °· You feel dizzy or weak. °· You feel light-headed or you faint. °This information is not intended to replace advice given to you by your health care provider. Make sure you discuss any questions you have with your health care provider. °Document Released: 09/27/2004 Document Revised: 04/18/2016 Document Reviewed: 03/21/2016 °Elsevier Interactive Patient Education © 2017 [employer] ° °

## 2016-08-29 ENCOUNTER — Inpatient Hospital Stay (HOSPITAL_COMMUNITY): Payer: BLUE CROSS/BLUE SHIELD

## 2016-08-29 ENCOUNTER — Inpatient Hospital Stay (HOSPITAL_COMMUNITY)
Admission: AD | Admit: 2016-08-29 | Discharge: 2016-08-29 | Disposition: A | Discharge status: Home / Self Care | Payer: BLUE CROSS/BLUE SHIELD | Source: Ambulatory Visit | Attending: Obstetrics and Gynecology | Admitting: Obstetrics and Gynecology

## 2016-08-29 ENCOUNTER — Encounter (HOSPITAL_COMMUNITY): Payer: Self-pay

## 2016-08-29 DIAGNOSIS — E282 Polycystic ovarian syndrome: Secondary | ICD-10-CM | POA: Diagnosis not present

## 2016-08-29 DIAGNOSIS — O99281 Endocrine, nutritional and metabolic diseases complicating pregnancy, first trimester: Secondary | ICD-10-CM | POA: Insufficient documentation

## 2016-08-29 DIAGNOSIS — O99511 Diseases of the respiratory system complicating pregnancy, first trimester: Secondary | ICD-10-CM | POA: Insufficient documentation

## 2016-08-29 DIAGNOSIS — Z6791 Unspecified blood type, Rh negative: Secondary | ICD-10-CM | POA: Diagnosis not present

## 2016-08-29 DIAGNOSIS — F419 Anxiety disorder, unspecified: Secondary | ICD-10-CM | POA: Insufficient documentation

## 2016-08-29 DIAGNOSIS — O26891 Other specified pregnancy related conditions, first trimester: Secondary | ICD-10-CM

## 2016-08-29 DIAGNOSIS — R102 Pelvic and perineal pain: Secondary | ICD-10-CM | POA: Diagnosis not present

## 2016-08-29 DIAGNOSIS — J45909 Unspecified asthma, uncomplicated: Secondary | ICD-10-CM | POA: Diagnosis not present

## 2016-08-29 DIAGNOSIS — O99411 Diseases of the circulatory system complicating pregnancy, first trimester: Secondary | ICD-10-CM | POA: Diagnosis not present

## 2016-08-29 DIAGNOSIS — Z79899 Other long term (current) drug therapy: Secondary | ICD-10-CM | POA: Insufficient documentation

## 2016-08-29 DIAGNOSIS — Z3A01 Less than 8 weeks gestation of pregnancy: Secondary | ICD-10-CM | POA: Diagnosis not present

## 2016-08-29 DIAGNOSIS — O00102 Left tubal pregnancy without intrauterine pregnancy: Secondary | ICD-10-CM | POA: Diagnosis not present

## 2016-08-29 DIAGNOSIS — O00109 Unspecified tubal pregnancy without intrauterine pregnancy: Secondary | ICD-10-CM | POA: Diagnosis not present

## 2016-08-29 DIAGNOSIS — O99341 Other mental disorders complicating pregnancy, first trimester: Secondary | ICD-10-CM | POA: Insufficient documentation

## 2016-08-29 DIAGNOSIS — O3680X0 Pregnancy with inconclusive fetal viability, not applicable or unspecified: Secondary | ICD-10-CM

## 2016-08-29 DIAGNOSIS — I7300 Raynaud's syndrome without gangrene: Secondary | ICD-10-CM | POA: Diagnosis not present

## 2016-08-29 DIAGNOSIS — O009 Unspecified ectopic pregnancy without intrauterine pregnancy: Secondary | ICD-10-CM | POA: Diagnosis present

## 2016-08-29 DIAGNOSIS — Z6831 Body mass index (BMI) 31.0-31.9, adult: Secondary | ICD-10-CM | POA: Diagnosis not present

## 2016-08-29 DIAGNOSIS — F3181 Bipolar II disorder: Secondary | ICD-10-CM | POA: Insufficient documentation

## 2016-08-29 HISTORY — DX: Major depressive disorder, single episode, unspecified: F32.9

## 2016-08-29 HISTORY — DX: Anxiety disorder, unspecified: F41.9

## 2016-08-29 HISTORY — DX: Depression, unspecified: F32.A

## 2016-08-29 LAB — HCG, QUANTITATIVE, PREGNANCY: hCG, Beta Chain, Quant, S: 3465 m[IU]/mL — ABNORMAL HIGH (ref <5)

## 2016-08-29 LAB — BUN: BUN: 6 mg/dL (ref 6–20)

## 2016-08-29 LAB — CBC
HCT: 38.6 % (ref 36.0–46.0)
Hemoglobin: 13 g/dL (ref 12.0–15.0)
MCH: 30.6 pg (ref 26.0–34.0)
MCHC: 33.7 g/dL (ref 30.0–36.0)
MCV: 90.8 fL (ref 78.0–100.0)
Platelets: 323 10*3/uL (ref 150–400)
RBC: 4.25 MIL/uL (ref 3.87–5.11)
RDW: 13.3 % (ref 11.5–15.5)
WBC: 8.2 10*3/uL (ref 4.0–10.5)

## 2016-08-29 LAB — CREATININE, SERUM
Creatinine, Ser: 0.43 mg/dL — ABNORMAL LOW (ref 0.44–1.00)
GFR calc Af Amer: >60 mL/min (ref >60)
GFR calc non Af Amer: >60 mL/min (ref >60)

## 2016-08-29 LAB — AST: AST: 22 U/L (ref 15–41)

## 2016-08-29 MED ORDER — RHO D IMMUNE GLOBULIN 1500 UNIT/2ML IJ SOSY
300.0000 ug | PREFILLED_SYRINGE | Freq: Once | INTRAMUSCULAR | Status: AC
  Administered 2016-08-29: 300 ug via INTRAMUSCULAR
  Filled 2016-08-29: qty 2

## 2016-08-29 MED ORDER — METHOTREXATE INJECTION FOR WOMEN'S HOSPITAL
50.0000 mg/m2 | Freq: Once | INTRAMUSCULAR | Status: AC
  Administered 2016-08-29: 90 mg via INTRAMUSCULAR
  Filled 2016-08-29: qty 1.8

## 2016-08-29 NOTE — MAU Note (Signed)
Dr Dareen PianoAnderson sent her over, thinks she may have an ectopic.  Is not bleeding. Is not currently in pain.  Has had some mild period like cramps, occ will have a pain in LLQ.  Has had some brown d/c, mainly in the mornings.

## 2016-08-29 NOTE — Discharge Instructions (Signed)
Ectopic Pregnancy °An ectopic pregnancy is when the fertilized egg attaches (implants) outside the uterus. Most ectopic pregnancies occur in one of the tubes where eggs travel from the ovary to the uterus (fallopian tubes), but the implanting can occur in other locations. In rare cases, ectopic pregnancies occur on the ovary, intestine, pelvis, abdomen, or cervix. In an ectopic pregnancy, the fertilized egg does not have the ability to develop into a normal, healthy baby. °A ruptured ectopic pregnancy is one in which tearing or bursting of a fallopian tube causes internal bleeding. Often, there is intense lower abdominal pain, and vaginal bleeding sometimes occurs. Having an ectopic pregnancy can be life-threatening. If this dangerous condition is not treated, it can lead to blood loss, shock, or even death. °What are the causes? °The most common cause of this condition is damage to one of the fallopian tubes. A fallopian tube may be narrowed or blocked, and that keeps the fertilized egg from reaching the uterus. °What increases the risk? °This condition is more likely to develop in women of childbearing age who have different levels of risk. The levels of risk can be divided into three categories. °High risk °· You have gone through infertility treatment. °· You have had an ectopic pregnancy before. °· You have had surgery on the fallopian tubes, or another surgical procedure, such as an abortion. °· You have had surgery to have the fallopian tubes tied (tubal ligation). °· You have problems or diseases of the fallopian tubes. °· You have been exposed to diethylstilbestrol (DES). This medicine was used until 1971, and it had effects on babies whose mothers took the medicine. °· You become pregnant while using an IUD (intrauterine device) for birth control. °Moderate risk °· You have a history of infertility. °· You have had an STI (sexually transmitted infection). °· You have a history of pelvic inflammatory  disease (PID). °· You have scarring from endometriosis. °· You have multiple sexual partners. °· You smoke. °Low risk °· You have had pelvic surgery. °· You use vaginal douches. °· You became sexually active before age 18. °What are the signs or symptoms? °Common symptoms of this condition include normal pregnancy symptoms, such as missing a period, nausea, tiredness, abdominal pain, breast tenderness, and bleeding. However, ectopic pregnancy will have additional symptoms, such as: °· Pain with intercourse. °· Irregular vaginal bleeding or spotting. °· Cramping or pain on one side or in the lower abdomen. °· Fast heartbeat, low blood pressure, and sweating. °· Passing out while having a bowel movement. °Symptoms of a ruptured ectopic pregnancy and internal bleeding may include: °· Sudden, severe pain in the abdomen and pelvis. °· Dizziness, weakness, light-headedness, or fainting. °· Pain in the shoulder or neck area. °How is this diagnosed? °This condition is diagnosed by: °· A pelvic exam to locate pain or a mass in the abdomen. °· A pregnancy test. This blood test checks for the presence as well as the specific level of pregnancy hormone in the bloodstream. °· Ultrasound. This is performed if a pregnancy test is positive. In this test, a probe is inserted into the vagina. The probe will detect a fetus, possibly in a location other than the uterus. °· Taking a sample of uterus tissue (dilation and curettage, or D&C). °· Surgery to perform a visual exam of the inside of the abdomen using a thin, lighted tube that has a tiny camera on the end (laparoscope). °· Culdocentesis. This procedure involves inserting a needle at the top of the   vagina, behind the uterus. If blood is present in this area, it may indicate that a fallopian tube is torn. °How is this treated? °This condition is treated with medicine or surgery. °Medicine °· An injection of a medicine (methotrexate) may be given to cause the pregnancy tissue to be  absorbed. This medicine may save your fallopian tube. It may be given if: °¨ The diagnosis is made early, with no signs of active bleeding. °¨ The fallopian tube has not ruptured. °¨ You are considered to be a good candidate for the medicine. °Usually, pregnancy hormone blood levels are checked after methotrexate treatment. This is to be sure that the medicine is effective. It may take 4-6 weeks for the pregnancy to be absorbed. Most pregnancies will be absorbed by 3 weeks. °Surgery °· A laparoscope may be used to remove the pregnancy tissue. °· If severe internal bleeding occurs, a larger cut (incision) may be made in the lower abdomen (laparotomy) to remove the fetus and placenta. This is done to stop the bleeding. °· Part or all of the fallopian tube may be removed (salpingectomy) along with the fetus and placenta. The fallopian tube may also be repaired during the surgery. °· In very rare circumstances, removal of the uterus (hysterectomy) may be required. °· After surgery, pregnancy hormone testing may be done to be sure that there is no pregnancy tissue left. °Whether your treatment is medicine or surgery, you may receive a Rho (D) immune globulin shot to prevent problems with any future pregnancy. This shot may be given if: °· You are Rh-negative and the baby's father is Rh-positive. °· You are Rh-negative and you do not know the Rh type of the baby's father. °Follow these instructions at home: °· Rest and limit your activity after the procedure for as long as told by your health care provider. °· Until your health care provider says that it is safe: °¨ Do not lift anything that is heavier than 10 lb (4.5 kg), or the limit that your health care provider tells you. °¨ Avoid physical exercise and any movement that requires effort (is strenuous). °· To help prevent constipation: °¨ Eat a healthy diet that includes fruits, vegetables, and whole grains. °¨ Drink 6-8 glasses of water per day. °Get help right away  if: °· You develop worsening pain that is not relieved by medicine. °· You have: °¨ A fever or chills. °¨ Vaginal bleeding. °¨ Redness and swelling at the incision site. °¨ Nausea and vomiting. °· You feel dizzy or weak. °· You feel light-headed or you faint. °This information is not intended to replace advice given to you by your health care provider. Make sure you discuss any questions you have with your health care provider. °Document Released: 09/27/2004 Document Revised: 04/18/2016 Document Reviewed: 03/21/2016 °Elsevier Interactive Patient Education © 2017 [employer] ° ° ° °Methotrexate Treatment for an Ectopic Pregnancy °Methotrexate is a medicine that treats ectopic pregnancy by stopping the growth of the fertilized egg. It also helps your body absorb tissue from the egg. This takes between 2 weeks and 6 weeks. Most ectopic pregnancies can be successfully treated with methotrexate if they are detected early enough. °LET YOUR HEALTH CARE PROVIDER KNOW ABOUT: °· Any allergies you have. °· All medicines you are taking, including vitamins, herbs, eye drops, creams, and over-the-counter medicines. °· Medical conditions you have. °RISKS AND COMPLICATIONS °Generally, this is a safe treatment. However, as with any treatment, problems can occur. Possible problems or side effects include: °·   Nausea. °· Vomiting. °· Diarrhea. °· Abdominal cramping. °· Mouth sores. °· Increased vaginal bleeding or spotting.   °· Swelling or irritation of the lining of your lungs (pneumonitis).  °· Failed treatment and continuation of the pregnancy.   °· Liver damage. °· Hair loss. °There is still a risk of the ectopic pregnancy rupturing while using the methotrexate. °BEFORE THE PROCEDURE °Before you take the medicine:  °· Liver tests, kidney tests, and a complete blood test are performed. °· Blood tests are performed to measure the pregnancy hormone levels and to determine your blood type. °· If you are Rh-negative and the father is  Rh-positive or his Rh type is not known, you will be given a Rho (D) immune globulin shot. °PROCEDURE  °There are two methods that your health care provider may use to prescribe methotrexate. One method involves a single dose or injection of the medicine. Another method involves a series of doses given through several injections.  °AFTER THE PROCEDURE °· You may have some abdominal cramping, vaginal bleeding, and fatigue in the first few days after taking methotrexate. °· Blood tests will be taken for several weeks to check the pregnancy hormone levels. The blood tests are performed until there is no more pregnancy hormone detected in the blood. °This information is not intended to replace advice given to you by your health care provider. Make sure you discuss any questions you have with your health care provider. °Document Released: 08/14/2001 Document Revised: 09/10/2014 Document Reviewed: 06/08/2013 °Elsevier Interactive Patient Education © 2017 [employer] ° °

## 2016-08-29 NOTE — MAU Provider Note (Signed)
History     CSN: [id number]  Arrival date and time: 12/27/[id number] 1146   First Provider Initiated Contact with Patient 08/29/16 1237      Chief Complaint  Patient presents with  . Ectopic Pregnancy   HPI Jamie Waters is a 25 y.o. G4P0030 at 4187w5d by LMP who presents from the office for ectopic workup. Ob history significant for ectopic pregnancy x 2 & left salpingectomy. Patient saw Dr. Dareen PianoAnderson in office today. States he did an ultrasound & was concerned for ectopic pregnancy so sent her to MAU for labs & official ultrasound. Patient denies abdominal pain, n/v, fever/chills, or vaginal bleeding.   OB History    Gravida Para Term Preterm AB Living   4       3 0   SAB TAB Ectopic Multiple Live Births   1   2   0      Past Medical History:  Diagnosis Date  . Anxiety   . Arthritis   . Asthma   . Bipolar II disorder (HCC)   . Depression   . Ectopic fetus   . Fibromyalgia   . PCOS (polycystic ovarian syndrome) 2015  . Raynauds syndrome 2015    Past Surgical History:  Procedure Laterality Date  . cyst removed from throat    . DIAGNOSTIC LAPAROSCOPY WITH REMOVAL OF ECTOPIC PREGNANCY    . LAPAROSCOPY N/A 09/11/2015   Procedure: LAPAROSCOPY OPERATIVE, Right Salpingectomy with Removal Ectopic Pregnancy;  Surgeon: Waynard ReedsKendra Ross, MD;  Location: [facility];  Service: Gynecology;  Laterality: N/A;  . THYROIDECTOMY, PARTIAL  2010    Family History  Problem Relation Age of Onset  . Dementia Father   . Alcohol abuse Father   . Bipolar disorder Sister   . Autism spectrum disorder Sister   . Lupus Maternal Grandmother   . Cancer Maternal Grandmother   . Cancer Paternal Grandfather     Social History  Substance Use Topics  . Smoking status: Never Smoker  . Smokeless tobacco: Never Used  . Alcohol use No    Allergies:  Allergies  Allergen Reactions  . Cymbalta [Duloxetine Hcl] Shortness Of Breath  . Norvasc [Amlodipine Besylate] Shortness Of Breath and Other (See Comments)   Reaction:  Tightness in chest     Prescriptions Prior to Admission  Medication Sig Dispense Refill Last Dose  . acetaminophen (TYLENOL) 500 MG tablet Take 500 mg by mouth every 6 (six) hours as needed for mild pain, moderate pain or headache.   08/18/2016 at 2300  . albuterol (PROVENTIL HFA;VENTOLIN HFA) 108 (90 Base) MCG/ACT inhaler Inhale 1-2 puffs into the lungs every 6 (six) hours as needed for wheezing or shortness of breath.   Past Month at Unknown time  . docusate sodium (COLACE) 100 MG capsule Take 300 mg by mouth at bedtime.   08/18/2016 at Unknown time  . HYDROcodone-acetaminophen (NORCO/VICODIN) 5-325 MG tablet Take 1-2 tablets by mouth every 4 (four) hours as needed. 20 tablet 0   . hydroxychloroquine (PLAQUENIL) 200 MG tablet Take 400 mg by mouth at bedtime.    08/18/2016 at Unknown time  . metroNIDAZOLE (FLAGYL) 500 MG tablet Take 1 tablet (500 mg total) by mouth 2 (two) times daily. 14 tablet 0   . Prenatal Vit-Fe Fumarate-FA (MULTIVITAMIN-PRENATAL) 27-0.8 MG TABS tablet Take 1 tablet by mouth at bedtime.    08/18/2016 at Unknown time    Review of Systems  Constitutional: Negative.   Gastrointestinal: Negative.   Genitourinary: Negative.    Physical  Exam   Blood pressure 124/69, pulse 98, temperature 98.7 F (37.1 C), temperature source Oral, resp. rate 18, height 5\' 1"  (1.549 m), weight 159 lb 3.2 oz (72.2 kg), last menstrual period 07/20/2016, unknown if currently breastfeeding.  Physical Exam  Nursing note and vitals reviewed. Constitutional: She is oriented to person, place, and time. She appears well-developed and well-nourished. No distress.  HENT:  Head: Normocephalic and atraumatic.  Eyes: Conjunctivae are normal. Right eye exhibits no discharge. Left eye exhibits no discharge. No scleral icterus.  Neck: Normal range of motion.  Cardiovascular: Normal rate, regular rhythm and normal heart sounds.   No murmur heard. Respiratory: Effort normal and breath sounds  normal. No respiratory distress. She has no wheezes.  GI: Soft. Bowel sounds are normal. She exhibits no distension. There is no tenderness. There is no rebound and no guarding.  Neurological: She is alert and oriented to person, place, and time.  Skin: Skin is warm and dry. She is not diaphoretic.  Psychiatric: She has a normal mood and affect. Her behavior is normal. Judgment and thought content normal.    MAU Course  Procedures Results for orders placed or performed during the hospital encounter of 08/29/16 (from the past 24 hour(s))  hCG, quantitative, pregnancy     Status: Abnormal   Collection Time: 08/29/16 12:00 PM  Result Value Ref Range   hCG, Beta Chain, Quant, S 3,465 (H) <5 mIU/mL  CBC     Status: None   Collection Time: 08/29/16 12:00 PM  Result Value Ref Range   WBC 8.2 4.0 - 10.5 K/uL   RBC 4.25 3.87 - 5.11 MIL/uL   Hemoglobin 13.0 12.0 - 15.0 g/dL   HCT 91.438.6 78.236.0 - 95.646.0 %   MCV 90.8 78.0 - 100.0 fL   MCH 30.6 26.0 - 34.0 pg   MCHC 33.7 30.0 - 36.0 g/dL   RDW 21.313.3 08.611.5 - 57.815.5 %   Platelets 323 150 - 400 K/uL  AST     Status: None   Collection Time: 08/29/16 12:00 PM  Result Value Ref Range   AST 22 15 - 41 U/L  BUN     Status: None   Collection Time: 08/29/16 12:00 PM  Result Value Ref Range   BUN 6 6 - 20 mg/dL  Creatinine, serum     Status: Abnormal   Collection Time: 08/29/16 12:00 PM  Result Value Ref Range   Creatinine, Ser 0.43 (L) 0.44 - 1.00 mg/dL   GFR calc non Af Amer >60 >60 mL/min   GFR calc Af Amer >60 >60 mL/min  Rh IG workup (includes ABO/Rh)     Status: None (Preliminary result)   Collection Time: 08/29/16 12:00 PM  Result Value Ref Range   Gestational Age(Wks) 5    ABO/RH(D) A NEG    Antibody Screen NEG    Unit Number [id number]    Blood Component Type RHIG    Unit division 00    Status of Unit ISSUED    Transfusion Status OK TO TRANSFUSE    [place] Ob Transvaginal  Result Date: 08/29/2016 CLINICAL DATA:  Abdominal pain.  Evaluate  for ectopic pregnancy. EXAM: TRANSVAGINAL OB ULTRASOUND TECHNIQUE: Transvaginal ultrasound was performed for complete evaluation of the gestation as well as the maternal uterus, adnexal regions, and pelvic cul-de-sac. COMPARISON:  None. FINDINGS: No intrauterine pregnancy is identified. A 1.5 cm fibroid is seen posteriorly in the uterine fundus. The right ovary is normal in appearance. The left ovary is also normal  in appearance. Both ovaries contain follicles but no solid masses. There is an abnormal gestational sac containing a yolk sac adjacent to the left ovary consistent with an ectopic pregnancy. Trace fluid is seen adjacent to the left ovary. IMPRESSION: 1. Ectopic pregnancy adjacent to the left ovary. The gestational sac measures 1.4 x 1.2 x 1.1 cm, consistent with a gestational age of [redacted] weeks and 2 days. This finding was called to the patient's clinician, Judeth Horn. Electronically Signed   By: Gerome Sam III M.D   On: 08/29/2016 14:43    MDM CBC, bhcg, ultrasound A negative -- rhogham given Ultrasound shows ectopic pregnancy measuring 1.4 cm adjacent to left ovary S/w Dr. Dareen Piano regarding results. Will offer patient methotrexate at this time.   MTX labs ordered & wnl. Patient agreeable to MTX treatment today.  Spiritual care called to bedside for emotional support  Assessment and Plan  A; 1. Left tubal pregnancy without intrauterine pregnancy   2. Pregnancy of unknown anatomic location   3. Rh negative status during pregnancy in first trimester    P: Discharge home Return to MAU Saturday for day 4 labs Pelvic rest Strict return precautions D/c prenatal vitamins No NSAIDs  Judeth Horn 08/29/2016, 12:37 PM

## 2016-08-29 NOTE — Progress Notes (Signed)
I spent time with Jamie Waters and her husband, Jamie Waters.  They have experienced 3 ectopic pregnancies (including this one) and 1 miscarriage since 2016. They very much want to have a baby and have been very focused on trying to get pregnant.  This pregnancy has felt different for her and she has not experienced the pain that she did with her previous ectopic pregnancies so she was hopeful that it would be a different outcome.  She has been dealing with depression and anxiety since high school and lost her father 4 years ago.  Her family lost a child at 24 years old before she was born.  [facility] and has been diagnosed with bipolar.  I encouraged her to call her counselor as soon as she is able to set up an appointment to help her process this.  I also gave her information for [facility] as well as my card for further follow up support.  We talked about the dreams that she has had giving her the feeling that this baby was a girl.  We also talked about ways to memorialize her babies.  She had received a heart pillow for her other ectopic pregnancies, but not for her miscarriage (which did not take place at the hospital).  She felt empowered to ask for a heart pillow for this baby as well as for the baby she lost to miscarriage.    Jamie Waters, her husband, states that he copes by trying not to think about it too much.  They both stated that they have good support from family and that these losses have brought them closer together.    Chaplain Dyanne CarrelKaty Nicolena Schurman, Bcc Pager, [phone] 4:26 PM    08/29/16 1600  Clinical Encounter Type  Visited With Patient and family together  Visit Type Initial  Referral From Nurse  Stress Factors  Patient Stress Factors Loss

## 2016-08-30 LAB — RH IG WORKUP (INCLUDES ABO/RH)
ABO/RH(D): A NEG
Antibody Screen: NEGATIVE
Gestational Age(Wks): 5
Unit division: 0

## 2016-09-01 ENCOUNTER — Inpatient Hospital Stay (HOSPITAL_COMMUNITY)
Admission: AD | Admit: 2016-09-01 | Discharge: 2016-09-01 | Disposition: A | Discharge status: Home / Self Care | Payer: BLUE CROSS/BLUE SHIELD | Source: Ambulatory Visit | Attending: Obstetrics and Gynecology | Admitting: Obstetrics and Gynecology

## 2016-09-01 DIAGNOSIS — O00102 Left tubal pregnancy without intrauterine pregnancy: Secondary | ICD-10-CM

## 2016-09-01 DIAGNOSIS — Z79899 Other long term (current) drug therapy: Secondary | ICD-10-CM | POA: Insufficient documentation

## 2016-09-01 DIAGNOSIS — R102 Pelvic and perineal pain: Secondary | ICD-10-CM | POA: Diagnosis not present

## 2016-09-01 DIAGNOSIS — O00109 Unspecified tubal pregnancy without intrauterine pregnancy: Secondary | ICD-10-CM | POA: Insufficient documentation

## 2016-09-01 LAB — URINALYSIS, ROUTINE W REFLEX MICROSCOPIC
Bilirubin Urine: NEGATIVE
Glucose, UA: NEGATIVE mg/dL
Hgb urine dipstick: NEGATIVE
Ketones, ur: 5 mg/dL — AB
Leukocytes, UA: NEGATIVE
Nitrite: NEGATIVE
Protein, ur: NEGATIVE mg/dL
Specific Gravity, Urine: 1.015 (ref 1.005–1.030)
pH: 8 (ref 5.0–8.0)

## 2016-09-01 LAB — HCG, QUANTITATIVE, PREGNANCY: hCG, Beta Chain, Quant, S: 4266 m[IU]/mL — ABNORMAL HIGH (ref <5)

## 2016-09-01 NOTE — MAU Note (Addendum)
Patient presents for f/u s/p Methotrexate Day 4, onset of left side pain since last night that is not going away.  Does not feel like she is voiding quantity sufficient.

## 2016-09-01 NOTE — Discharge Instructions (Signed)
Methotrexate Treatment for an Ectopic Pregnancy, Care After °Refer to this sheet in the next few weeks. These instructions provide you with information on caring for yourself after your procedure. Your health care provider may also give you more specific instructions. Your treatment has been planned according to current medical practices, but problems sometimes occur. Call your health care provider if you have any problems or questions after your procedure. °WHAT TO EXPECT AFTER THE PROCEDURE °You may have some abdominal cramping, vaginal bleeding, and fatigue in the first few days after taking methotrexate. Some other possible side effects of methotrexate include: °· Nausea. °· Vomiting. °· Diarrhea. °· Mouth sores. °· Swelling or irritation of the lining of your lungs (pneumonitis). °· Liver damage. °· Hair loss. °HOME CARE INSTRUCTIONS  °After you have received the methotrexate medicine, you need to be careful of your activities and watch your condition for several weeks. It may take 1 week before your hormone levels return to normal. °· Keep all follow-up appointments as directed by your health care provider. °· Avoid traveling too far away from your health care provider. °· Do not have sexual intercourse until your health care provider says it is safe to do so. °· You may resume your usual diet. °· Limit strenuous activity. °· Do not take folic acid, prenatal vitamins, or other vitamins that contain folic acid. °· Do not take aspirin, ibuprofen, or naproxen (nonsteroidal anti-inflammatory drugs [NSAIDs]). °· Do not drink alcohol. °SEEK MEDICAL CARE IF:  °· You cannot control your nausea and vomiting. °· You cannot control your diarrhea. °· You have sores in your mouth and want treatment. °· You need pain medicine for your abdominal pain. °· You have a rash. °· You are having a reaction to the medicine. °SEEK IMMEDIATE MEDICAL CARE IF:  °· You have increasing abdominal or pelvic pain. °· You notice increased  bleeding. °· You feel light-headed, or you faint. °· You have shortness of breath. °· Your heart rate increases. °· You have a cough. °· You have chills. °· You have a fever. °This information is not intended to replace advice given to you by your health care provider. Make sure you discuss any questions you have with your health care provider. °Document Released: 08/09/2011 Document Revised: 08/25/2013 Document Reviewed: 06/08/2013 °Elsevier Interactive Patient Education © 2017 Elsevier Inc. °Ectopic Pregnancy °An ectopic pregnancy is when the fertilized egg attaches (implants) outside the uterus. Most ectopic pregnancies occur in one of the tubes where eggs travel from the ovary to the uterus (fallopian tubes), but the implanting can occur in other locations. In rare cases, ectopic pregnancies occur on the ovary, intestine, pelvis, abdomen, or cervix. In an ectopic pregnancy, the fertilized egg does not have the ability to develop into a normal, healthy baby. °A ruptured ectopic pregnancy is one in which tearing or bursting of a fallopian tube causes internal bleeding. Often, there is intense lower abdominal pain, and vaginal bleeding sometimes occurs. Having an ectopic pregnancy can be life-threatening. If this dangerous condition is not treated, it can lead to blood loss, shock, or even death. °What are the causes? °The most common cause of this condition is damage to one of the fallopian tubes. A fallopian tube may be narrowed or blocked, and that keeps the fertilized egg from reaching the uterus. °What increases the risk? °This condition is more likely to develop in women of childbearing age who have different levels of risk. The levels of risk can be divided into three categories. °High   risk °· You have gone through infertility treatment. °· You have had an ectopic pregnancy before. °· You have had surgery on the fallopian tubes, or another surgical procedure, such as an abortion. °· You have had surgery to  have the fallopian tubes tied (tubal ligation). °· You have problems or diseases of the fallopian tubes. °· You have been exposed to diethylstilbestrol (DES). This medicine was used until 1971, and it had effects on babies whose mothers took the medicine. °· You become pregnant while using an IUD (intrauterine device) for birth control. °Moderate risk °· You have a history of infertility. °· You have had an STI (sexually transmitted infection). °· You have a history of pelvic inflammatory disease (PID). °· You have scarring from endometriosis. °· You have multiple sexual partners. °· You smoke. °Low risk °· You have had pelvic surgery. °· You use vaginal douches. °· You became sexually active before age 18. °What are the signs or symptoms? °Common symptoms of this condition include normal pregnancy symptoms, such as missing a period, nausea, tiredness, abdominal pain, breast tenderness, and bleeding. However, ectopic pregnancy will have additional symptoms, such as: °· Pain with intercourse. °· Irregular vaginal bleeding or spotting. °· Cramping or pain on one side or in the lower abdomen. °· Fast heartbeat, low blood pressure, and sweating. °· Passing out while having a bowel movement. °Symptoms of a ruptured ectopic pregnancy and internal bleeding may include: °· Sudden, severe pain in the abdomen and pelvis. °· Dizziness, weakness, light-headedness, or fainting. °· Pain in the shoulder or neck area. °How is this diagnosed? °This condition is diagnosed by: °· A pelvic exam to locate pain or a mass in the abdomen. °· A pregnancy test. This blood test checks for the presence as well as the specific level of pregnancy hormone in the bloodstream. °· Ultrasound. This is performed if a pregnancy test is positive. In this test, a probe is inserted into the vagina. The probe will detect a fetus, possibly in a location other than the uterus. °· Taking a sample of uterus tissue (dilation and curettage, or D&C). °· Surgery  to perform a visual exam of the inside of the abdomen using a thin, lighted tube that has a tiny camera on the end (laparoscope). °· Culdocentesis. This procedure involves inserting a needle at the top of the vagina, behind the uterus. If blood is present in this area, it may indicate that a fallopian tube is torn. °How is this treated? °This condition is treated with medicine or surgery. °Medicine °· An injection of a medicine (methotrexate) may be given to cause the pregnancy tissue to be absorbed. This medicine may save your fallopian tube. It may be given if: °¨ The diagnosis is made early, with no signs of active bleeding. °¨ The fallopian tube has not ruptured. °¨ You are considered to be a good candidate for the medicine. °Usually, pregnancy hormone blood levels are checked after methotrexate treatment. This is to be sure that the medicine is effective. It may take 4-6 weeks for the pregnancy to be absorbed. Most pregnancies will be absorbed by 3 weeks. °Surgery °· A laparoscope may be used to remove the pregnancy tissue. °· If severe internal bleeding occurs, a larger cut (incision) may be made in the lower abdomen (laparotomy) to remove the fetus and placenta. This is done to stop the bleeding. °· Part or all of the fallopian tube may be removed (salpingectomy) along with the fetus and placenta. The fallopian tube may also   may also be repaired during the surgery.  In very rare circumstances, removal of the uterus (hysterectomy) may be required.  After surgery, pregnancy hormone testing may be done to be sure that there is no pregnancy tissue left. Whether your treatment is medicine or surgery, you may receive a Rho (D) immune globulin shot to prevent problems with any future pregnancy. This shot may be given if:  You are Rh-negative and the baby's father is Rh-positive.  You are Rh-negative and you do not know the Rh type of the baby's father. Follow these instructions at home:  Rest and  limit your activity after the procedure for as long as told by your health care provider.  Until your health care provider says that it is safe:  Do not lift anything that is heavier than 10 lb (4.5 kg), or the limit that your health care provider tells you.  Avoid physical exercise and any movement that requires effort (is strenuous).  To help prevent constipation:  Eat a healthy diet that includes fruits, vegetables, and whole grains.  Drink 6-8 glasses of water per day. Get help right away if:  You develop worsening pain that is not relieved by medicine.  You have:  A fever or chills.  Vaginal bleeding.  Redness and swelling at the incision site.  Nausea and vomiting.  You feel dizzy or weak.  You feel light-headed or you faint. This information is not intended to replace advice given to you by your health care provider. Make sure you discuss any questions you have with your health care provider. Document Released: 09/27/2004 Document Revised: 04/18/2016 Document Reviewed: 03/21/2016 Elsevier Interactive Patient Education  2017 [employer]

## 2016-09-01 NOTE — MAU Provider Note (Signed)
History     CSN: [id number]  Arrival date and time: 09/01/16 1313   None     Chief Complaint  Patient presents with  . Follow-up  . Pelvic Pain   Jamie Readyshley J Hakala is a 25 y.o. [phone] who presents today for day #4 MTX labs. She reports that she has mild 2/10 LLQ pain. No bleeding at this time.   Abdominal Pain  This is a new problem. The current episode started in the past 7 days. The onset quality is gradual. The problem occurs intermittently. The problem has been gradually improving. The pain is located in the LLQ. The pain is at a severity of 2/10. The quality of the pain is cramping. The abdominal pain does not radiate. Pertinent negatives include no constipation, diarrhea, fever, nausea or vomiting. Nothing aggravates the pain. The pain is relieved by nothing. She has tried nothing for the symptoms. Prior diagnostic workup includes ultrasound.   Past Medical History:  Diagnosis Date  . Anxiety   . Arthritis   . Asthma   . Bipolar II disorder (HCC)   . Depression   . Ectopic fetus   . Fibromyalgia   . PCOS (polycystic ovarian syndrome) 2015  . Raynauds syndrome 2015    Past Surgical History:  Procedure Laterality Date  . cyst removed from throat    . DIAGNOSTIC LAPAROSCOPY WITH REMOVAL OF ECTOPIC PREGNANCY    . LAPAROSCOPY N/A 09/11/2015   Procedure: LAPAROSCOPY OPERATIVE, Right Salpingectomy with Removal Ectopic Pregnancy;  Surgeon: Waynard ReedsKendra Ross, MD;  Location: [facility];  Service: Gynecology;  Laterality: N/A;  . THYROIDECTOMY, PARTIAL  2010    Family History  Problem Relation Age of Onset  . Dementia Father   . Alcohol abuse Father   . Bipolar disorder Sister   . Autism spectrum disorder Sister   . Lupus Maternal Grandmother   . Cancer Maternal Grandmother   . Cancer Paternal Grandfather     Social History  Substance Use Topics  . Smoking status: Never Smoker  . Smokeless tobacco: Never Used  . Alcohol use No    Allergies:  Allergies  Allergen Reactions   . Cymbalta [Duloxetine Hcl] Shortness Of Breath  . Norvasc [Amlodipine Besylate] Shortness Of Breath and Other (See Comments)    Reaction:  Tightness in chest     Prescriptions Prior to Admission  Medication Sig Dispense Refill Last Dose  . albuterol (PROVENTIL HFA;VENTOLIN HFA) 108 (90 Base) MCG/ACT inhaler Inhale 1-2 puffs into the lungs every 6 (six) hours as needed for wheezing or shortness of breath.   Past Month at Unknown time  . docusate sodium (COLACE) 100 MG capsule Take 300 mg by mouth at bedtime.   08/28/2016 at Unknown time  . hydroxychloroquine (PLAQUENIL) 200 MG tablet Take 400 mg by mouth at bedtime.    Past Week at Unknown time  . Prenatal Vit-Fe Fumarate-FA (MULTIVITAMIN-PRENATAL) 27-0.8 MG TABS tablet Take 1 tablet by mouth at bedtime.    08/28/2016 at Unknown time    Review of Systems  Constitutional: Negative for chills and fever.  Gastrointestinal: Positive for abdominal pain. Negative for constipation, diarrhea, nausea and vomiting.  Neurological: Negative for dizziness and weakness.   Physical Exam   Blood pressure 119/67, pulse 96, temperature 98.4 F (36.9 C), resp. rate 16, last menstrual period 07/20/2016, unknown if currently breastfeeding.  Physical Exam  Nursing note and vitals reviewed. Constitutional: She is oriented to person, place, and time. She appears well-developed and well-nourished. No distress.  HENT:  Head: Normocephalic.  Cardiovascular: Normal rate.   Respiratory: Effort normal.  GI: Soft. There is no tenderness.  Neurological: She is alert and oriented to person, place, and time.  Skin: Skin is warm and dry.  Psychiatric: She has a normal mood and affect.     Results for orders placed or performed during the hospital encounter of 09/01/16 (from the past 24 hour(s))  Urinalysis, Routine w reflex microscopic     Status: Abnormal   Collection Time: 09/01/16  1:25 PM  Result Value Ref Range   Color, Urine YELLOW YELLOW   APPearance  CLOUDY (A) CLEAR   Specific Gravity, Urine 1.015 1.005 - 1.030   pH 8.0 5.0 - 8.0   Glucose, UA NEGATIVE NEGATIVE mg/dL   Hgb urine dipstick NEGATIVE NEGATIVE   Bilirubin Urine NEGATIVE NEGATIVE   Ketones, ur 5 (A) NEGATIVE mg/dL   Protein, ur NEGATIVE NEGATIVE mg/dL   Nitrite NEGATIVE NEGATIVE   Leukocytes, UA NEGATIVE NEGATIVE  hCG, quantitative, pregnancy     Status: Abnormal   Collection Time: 09/01/16  1:42 PM  Result Value Ref Range   hCG, Beta Chain, Quant, S 4,266 (H) <5 mIU/mL    Results for Jamie ReadyMANUEL, Shaasia J (MRN [id number]) as of 09/01/2016 14:58  Ref. Range 08/29/2016 12:00 08/29/2016 14:02 09/01/2016 13:25 09/01/2016 13:42  HCG, Beta Chain, Quant, S Latest Ref Range: <5 mIU/mL 3,465 (H)   4,266 (H)   MAU Course  Procedures  MDM 1502: D/W Dr. Henderson CloudHorvath, ok for DC home. FU here for day #7 labs   Assessment and Plan   1. Left tubal pregnancy without intrauterine pregnancy    DC home Comfort measures reviewed  Ectopic precautions RX: none  Return to MAU as needed   Follow-up Information    [facility]   Specialty:  Obstetrics and Gynecology Why:  Tuesday 09/04/16 for repeat blood work  Contact information: [address] [place] [id number] [phone]           Tawnya CrookHogan, Heather Donovan 09/01/2016, 3:17 PM

## 2016-09-04 ENCOUNTER — Other Ambulatory Visit: Payer: BLUE CROSS/BLUE SHIELD | Admitting: *Deleted

## 2016-09-04 DIAGNOSIS — O00109 Unspecified tubal pregnancy without intrauterine pregnancy: Secondary | ICD-10-CM

## 2016-09-04 LAB — HCG, QUANTITATIVE, PREGNANCY: hCG, Beta Chain, Quant, S: 3616 m[IU]/mL — ABNORMAL HIGH (ref <5)

## 2016-09-04 NOTE — Progress Notes (Signed)
Pt was seen in office for repeat hcg on Day 7 following MTX for ectopic pregnancy.  I have reviewed results and there is a 15% drop in hcg on today's labwork.  Discussed with Dr Claiborne Billingsallahan.  [facility] 1 week.  Message left for pt to return call.

## 2016-09-04 NOTE — Progress Notes (Signed)
Pt in for Day 7 post mtx hcg check. Pt feeling well. Denies pain and bleeding. Pt gave phone number that she can be reached at for results. Agrees to come back in if needed.

## 2016-09-05 ENCOUNTER — Telehealth: Payer: Self-pay | Admitting: *Deleted

## 2016-09-05 ENCOUNTER — Ambulatory Visit (INDEPENDENT_AMBULATORY_CARE_PROVIDER_SITE_OTHER): Payer: BLUE CROSS/BLUE SHIELD | Admitting: Pediatrics

## 2016-09-05 ENCOUNTER — Encounter: Payer: Self-pay | Admitting: Pediatrics

## 2016-09-05 VITALS — BP 107/69 | HR 86 | Temp 97.1°F | Ht 61.0 in | Wt 162.4 lb

## 2016-09-05 DIAGNOSIS — B379 Candidiasis, unspecified: Secondary | ICD-10-CM | POA: Diagnosis not present

## 2016-09-05 DIAGNOSIS — N898 Other specified noninflammatory disorders of vagina: Secondary | ICD-10-CM

## 2016-09-05 DIAGNOSIS — R399 Unspecified symptoms and signs involving the genitourinary system: Secondary | ICD-10-CM | POA: Diagnosis not present

## 2016-09-05 LAB — MICROSCOPIC EXAMINATION: Epithelial Cells (non renal): >10 /HPF — AB

## 2016-09-05 LAB — URINALYSIS, COMPLETE
Bilirubin, UA: NEGATIVE
Glucose, UA: NEGATIVE
Ketones, UA: NEGATIVE
Nitrite, UA: NEGATIVE
Protein, UA: NEGATIVE
Specific Gravity, UA: 1.02 (ref 1.005–1.030)
Urobilinogen, Ur: 0.2 mg/dL (ref 0.2–1.0)
pH, UA: 7 (ref 5.0–7.5)

## 2016-09-05 LAB — WET PREP FOR TRICH, YEAST, CLUE
Clue Cell Exam: NONE SEEN
Trichomonas Exam: NONE SEEN

## 2016-09-05 MED ORDER — FLUCONAZOLE 150 MG PO TABS: 150.0000 mg | ORAL_TABLET | Freq: Once | ORAL | 0 refills | Status: AC

## 2016-09-05 NOTE — Progress Notes (Signed)
  Subjective:   Patient ID: Jamie Waters, female    DOB: 1990/09/16, 26 y.o.   MRN: [id number] CC: Burning with Urination and Vaginal Redness  HPI: Jamie Waters is a 26 y.o. female presenting for Burning with Urination and Vaginal Redness  Burning with urination for past 4 days, started this Sunday night Had MTX for ectopic pregnancy last week No new soaps or lotions Started small amount of bleeding yesterday Has had prior ectopic pregnancies, spontaneous abortion Had R fallopian tube removed due to ectopic Has f/u with gyn for fertility problems  Relevant past medical, surgical, family and social history reviewed. Allergies and medications reviewed and updated. History  Smoking Status  . Never Smoker  Smokeless Tobacco  . Never Used   ROS: Per HPI   Objective:    BP 107/69   Pulse 86   Temp 97.1 F (36.2 C) (Oral)   Ht 5\' 1"  (1.549 m)   Wt 162 lb 6.4 oz (73.7 kg)   LMP 07/20/2016   Breastfeeding? Unknown   BMI 30.69 kg/m   Wt Readings from Last 3 Encounters:  09/05/16 162 lb 6.4 oz (73.7 kg)  08/29/16 159 lb 3.2 oz (72.2 kg)  08/23/16 164 lb 3.2 oz (74.5 kg)    Gen: NAD, alert, cooperative with exam, NCAT EYES: EOMI, no conjunctival injection, or no icterus CV: NRRR, normal S1/S2, no murmur, distal pulses 2+ b/l Resp: CTABL, no wheezes, normal WOB Abd: +BS, soft, NTND. no guarding or organomegaly Ext: No edema, warm Neuro: Alert and oriented, strength equal b/l UE and LE, coordination grossly normal MSK: normal muscle bulk GU: slightly pink irritated labia minora, white-green discharge in vaginal vault  Assessment & Plan:  Jamie Waters was seen today for burning with urination and vaginal redness.  Diagnoses and all orders for this visit: Vaginitis Avoid all soaps, fragrances, scented pads Possible MTX contributing  UTI symptoms -     Urinalysis, Complete  Vaginal discharge -     WET PREP FOR TRICH, YEAST, CLUE -     GC/Chlamydia Probe  Amp--negative  Yeast infection Wet prep positive for yeast -     fluconazole (DIFLUCAN) 150 MG tablet; Take 1 tablet (150 mg total) by mouth once.  Follow up plan: Prn if discharge does not resolve Rex Krasarol Recia Sons, MD [facility]

## 2016-09-05 NOTE — Telephone Encounter (Signed)
Pt left message yesterday requesting lab results.

## 2016-09-06 LAB — GC/CHLAMYDIA PROBE AMP
Chlamydia trachomatis, NAA: NEGATIVE
Neisseria gonorrhoeae by PCR: NEGATIVE

## 2016-09-06 NOTE — Telephone Encounter (Signed)
I called Jamie Waters and she is requesting her bhcg levels, I informed her they had dropped to about 3600 and I would need to check with a provider to see what the plan is and call her back.

## 2016-09-06 NOTE — Telephone Encounter (Signed)
I reviewed chart with Dr. Jolayne Pantheronstant and Ms. Jamie Waters is a private patient of Dr. Tenny Crawoss but had last bhcg drawn in our office 09/04/16. Needs weekly bhcg levels until levels return to normal-may have drawn in our office or Dr. Tenny Crawoss. I called Jamie Waters and discussed the reccommendations and she elects to have done in Dr. Charlott Rakesoss's office. She will call his office to schedule and will sign release at our office or his if needed to get her records.

## 2016-09-10 ENCOUNTER — Ambulatory Visit: Payer: Self-pay | Admitting: Physician Assistant

## 2016-09-11 DIAGNOSIS — Z8759 Personal history of other complications of pregnancy, childbirth and the puerperium: Secondary | ICD-10-CM | POA: Diagnosis not present

## 2016-09-13 ENCOUNTER — Ambulatory Visit (HOSPITAL_COMMUNITY)
Admission: AD | Admit: 2016-09-13 | Discharge: 2016-09-13 | Disposition: A | Discharge status: Home / Self Care | Payer: BLUE CROSS/BLUE SHIELD | Source: Ambulatory Visit | Attending: Obstetrics and Gynecology | Admitting: Obstetrics and Gynecology

## 2016-09-13 ENCOUNTER — Inpatient Hospital Stay (HOSPITAL_COMMUNITY): Payer: BLUE CROSS/BLUE SHIELD

## 2016-09-13 ENCOUNTER — Inpatient Hospital Stay (HOSPITAL_COMMUNITY): Payer: BLUE CROSS/BLUE SHIELD | Admitting: Anesthesiology

## 2016-09-13 ENCOUNTER — Encounter (HOSPITAL_COMMUNITY): Payer: Self-pay

## 2016-09-13 ENCOUNTER — Encounter (HOSPITAL_COMMUNITY)
Admission: AD | Disposition: A | Discharge status: Home / Self Care | Payer: Self-pay | Source: Ambulatory Visit | Attending: Obstetrics and Gynecology

## 2016-09-13 DIAGNOSIS — F319 Bipolar disorder, unspecified: Secondary | ICD-10-CM | POA: Insufficient documentation

## 2016-09-13 DIAGNOSIS — F419 Anxiety disorder, unspecified: Secondary | ICD-10-CM | POA: Diagnosis not present

## 2016-09-13 DIAGNOSIS — Z3A01 Less than 8 weeks gestation of pregnancy: Secondary | ICD-10-CM | POA: Diagnosis not present

## 2016-09-13 DIAGNOSIS — Z888 Allergy status to other drugs, medicaments and biological substances status: Secondary | ICD-10-CM | POA: Insufficient documentation

## 2016-09-13 DIAGNOSIS — J45909 Unspecified asthma, uncomplicated: Secondary | ICD-10-CM | POA: Insufficient documentation

## 2016-09-13 DIAGNOSIS — Z3A00 Weeks of gestation of pregnancy not specified: Secondary | ICD-10-CM | POA: Diagnosis not present

## 2016-09-13 DIAGNOSIS — M1990 Unspecified osteoarthritis, unspecified site: Secondary | ICD-10-CM | POA: Insufficient documentation

## 2016-09-13 DIAGNOSIS — O009 Unspecified ectopic pregnancy without intrauterine pregnancy: Secondary | ICD-10-CM | POA: Diagnosis not present

## 2016-09-13 DIAGNOSIS — M797 Fibromyalgia: Secondary | ICD-10-CM | POA: Diagnosis not present

## 2016-09-13 DIAGNOSIS — E282 Polycystic ovarian syndrome: Secondary | ICD-10-CM | POA: Insufficient documentation

## 2016-09-13 DIAGNOSIS — I7300 Raynaud's syndrome without gangrene: Secondary | ICD-10-CM | POA: Insufficient documentation

## 2016-09-13 DIAGNOSIS — O00102 Left tubal pregnancy without intrauterine pregnancy: Secondary | ICD-10-CM

## 2016-09-13 HISTORY — PX: DIAGNOSTIC LAPAROSCOPY WITH REMOVAL OF ECTOPIC PREGNANCY: SHX6449

## 2016-09-13 LAB — COMPREHENSIVE METABOLIC PANEL
ALT: 19 U/L (ref 14–54)
AST: 18 U/L (ref 15–41)
Albumin: 4.2 g/dL (ref 3.5–5.0)
Alkaline Phosphatase: 47 U/L (ref 38–126)
Anion gap: 9 (ref 5–15)
BUN: 8 mg/dL (ref 6–20)
CO2: 24 mmol/L (ref 22–32)
Calcium: 9 mg/dL (ref 8.9–10.3)
Chloride: 102 mmol/L (ref 101–111)
Creatinine, Ser: 0.47 mg/dL (ref 0.44–1.00)
GFR calc Af Amer: >60 mL/min (ref >60)
GFR calc non Af Amer: >60 mL/min (ref >60)
Glucose, Bld: 105 mg/dL — ABNORMAL HIGH (ref 65–99)
Potassium: 3.9 mmol/L (ref 3.5–5.1)
Sodium: 135 mmol/L (ref 135–145)
Total Bilirubin: 0.8 mg/dL (ref 0.3–1.2)
Total Protein: 7.4 g/dL (ref 6.5–8.1)

## 2016-09-13 LAB — CBC
HCT: 35.4 % — ABNORMAL LOW (ref 36.0–46.0)
Hemoglobin: 11.8 g/dL — ABNORMAL LOW (ref 12.0–15.0)
MCH: 30.6 pg (ref 26.0–34.0)
MCHC: 33.3 g/dL (ref 30.0–36.0)
MCV: 91.9 fL (ref 78.0–100.0)
Platelets: 297 10*3/uL (ref 150–400)
RBC: 3.85 MIL/uL — ABNORMAL LOW (ref 3.87–5.11)
RDW: 13.2 % (ref 11.5–15.5)
WBC: 10.4 10*3/uL (ref 4.0–10.5)

## 2016-09-13 LAB — HCG, QUANTITATIVE, PREGNANCY: hCG, Beta Chain, Quant, S: 1706 m[IU]/mL — ABNORMAL HIGH (ref <5)

## 2016-09-13 LAB — PREPARE RBC (CROSSMATCH)

## 2016-09-13 SURGERY — LAPAROSCOPY, WITH ECTOPIC PREGNANCY SURGICAL TREATMENT
Anesthesia: General | Site: Abdomen | Laterality: Left

## 2016-09-13 MED ORDER — LIDOCAINE HCL (CARDIAC) 20 MG/ML IV SOLN
INTRAVENOUS | Status: DC | PRN
  Administered 2016-09-13: 100 mg via INTRAVENOUS

## 2016-09-13 MED ORDER — METOCLOPRAMIDE HCL 5 MG/ML IJ SOLN: 10.0000 mg | Freq: Once | INTRAMUSCULAR | Status: DC | PRN

## 2016-09-13 MED ORDER — LACTATED RINGERS IV BOLUS (SEPSIS)
1000.0000 mL | Freq: Once | INTRAVENOUS | Status: AC
  Administered 2016-09-13: 1000 mL via INTRAVENOUS

## 2016-09-13 MED ORDER — SODIUM CHLORIDE 0.9 % IV SOLN: Freq: Once | INTRAVENOUS | Status: DC

## 2016-09-13 MED ORDER — DEXAMETHASONE SODIUM PHOSPHATE 10 MG/ML IJ SOLN
INTRAMUSCULAR | Status: DC | PRN
  Administered 2016-09-13: 10 mg via INTRAVENOUS

## 2016-09-13 MED ORDER — KETOROLAC TROMETHAMINE 30 MG/ML IJ SOLN
INTRAMUSCULAR | Status: DC | PRN
  Administered 2016-09-13: 30 mg via INTRAVENOUS

## 2016-09-13 MED ORDER — HYDROMORPHONE HCL 2 MG/ML IJ SOLN
2.0000 mg | Freq: Once | INTRAMUSCULAR | Status: AC
  Administered 2016-09-13: 2 mg via INTRAVENOUS
  Filled 2016-09-13: qty 1

## 2016-09-13 MED ORDER — LIDOCAINE HCL (CARDIAC) 20 MG/ML IV SOLN
INTRAVENOUS | Status: AC
  Filled 2016-09-13: qty 5

## 2016-09-13 MED ORDER — FENTANYL CITRATE (PF) 100 MCG/2ML IJ SOLN
INTRAMUSCULAR | Status: AC
  Filled 2016-09-13: qty 2

## 2016-09-13 MED ORDER — ONDANSETRON HCL 4 MG/2ML IJ SOLN: 4.0000 mg | Freq: Once | INTRAMUSCULAR | Status: DC | PRN

## 2016-09-13 MED ORDER — LACTATED RINGERS IV SOLN: INTRAVENOUS | Status: DC

## 2016-09-13 MED ORDER — GLYCOPYRROLATE 0.2 MG/ML IJ SOLN
INTRAMUSCULAR | Status: AC
  Filled 2016-09-13: qty 1

## 2016-09-13 MED ORDER — FENTANYL CITRATE (PF) 250 MCG/5ML IJ SOLN
INTRAMUSCULAR | Status: AC
  Filled 2016-09-13: qty 5

## 2016-09-13 MED ORDER — MIDAZOLAM HCL 5 MG/5ML IJ SOLN
INTRAMUSCULAR | Status: DC | PRN
  Administered 2016-09-13: 2 mg via INTRAVENOUS

## 2016-09-13 MED ORDER — BUPIVACAINE HCL (PF) 0.25 % IJ SOLN
INTRAMUSCULAR | Status: DC | PRN
  Administered 2016-09-13: 10 mL

## 2016-09-13 MED ORDER — SUCCINYLCHOLINE CHLORIDE 20 MG/ML IJ SOLN
INTRAMUSCULAR | Status: DC | PRN
  Administered 2016-09-13: 120 mg via INTRAVENOUS

## 2016-09-13 MED ORDER — CEFAZOLIN SODIUM-DEXTROSE 2-4 GM/100ML-% IV SOLN
2.0000 g | INTRAVENOUS | Status: AC
  Administered 2016-09-13: 2 g via INTRAVENOUS

## 2016-09-13 MED ORDER — SUGAMMADEX SODIUM 200 MG/2ML IV SOLN
INTRAVENOUS | Status: DC | PRN
  Administered 2016-09-13: 200 mg via INTRAVENOUS

## 2016-09-13 MED ORDER — PHENYLEPHRINE 40 MCG/ML (10ML) SYRINGE FOR IV PUSH (FOR BLOOD PRESSURE SUPPORT)
PREFILLED_SYRINGE | INTRAVENOUS | Status: AC
  Filled 2016-09-13: qty 10

## 2016-09-13 MED ORDER — MIDAZOLAM HCL 2 MG/2ML IJ SOLN
INTRAMUSCULAR | Status: AC
  Filled 2016-09-13: qty 2

## 2016-09-13 MED ORDER — GLYCOPYRROLATE 0.2 MG/ML IJ SOLN
INTRAMUSCULAR | Status: DC | PRN
  Administered 2016-09-13 (×2): 0.1 mg via INTRAVENOUS

## 2016-09-13 MED ORDER — FENTANYL CITRATE (PF) 100 MCG/2ML IJ SOLN
INTRAMUSCULAR | Status: DC | PRN
  Administered 2016-09-13: 50 ug via INTRAVENOUS
  Administered 2016-09-13: 100 ug via INTRAVENOUS
  Administered 2016-09-13 (×2): 50 ug via INTRAVENOUS

## 2016-09-13 MED ORDER — DEXAMETHASONE SODIUM PHOSPHATE 10 MG/ML IJ SOLN
INTRAMUSCULAR | Status: AC
  Filled 2016-09-13: qty 1

## 2016-09-13 MED ORDER — PROPOFOL 10 MG/ML IV BOLUS
INTRAVENOUS | Status: AC
  Filled 2016-09-13: qty 20

## 2016-09-13 MED ORDER — LACTATED RINGERS IR SOLN
Status: DC | PRN
  Administered 2016-09-13: 3000 mL

## 2016-09-13 MED ORDER — HYDROMORPHONE HCL 1 MG/ML IJ SOLN
INTRAMUSCULAR | Status: DC | PRN
  Administered 2016-09-13: 1 mg via INTRAVENOUS

## 2016-09-13 MED ORDER — FENTANYL CITRATE (PF) 100 MCG/2ML IJ SOLN: 25.0000 ug | INTRAMUSCULAR | Status: DC | PRN

## 2016-09-13 MED ORDER — ONDANSETRON HCL 4 MG/2ML IJ SOLN
INTRAMUSCULAR | Status: AC
  Filled 2016-09-13: qty 2

## 2016-09-13 MED ORDER — SUCCINYLCHOLINE CHLORIDE 200 MG/10ML IV SOSY
PREFILLED_SYRINGE | INTRAVENOUS | Status: AC
  Filled 2016-09-13: qty 10

## 2016-09-13 MED ORDER — ROCURONIUM BROMIDE 100 MG/10ML IV SOLN
INTRAVENOUS | Status: AC
  Filled 2016-09-13: qty 1

## 2016-09-13 MED ORDER — 0.9 % SODIUM CHLORIDE (POUR BTL) OPTIME
TOPICAL | Status: DC | PRN
  Administered 2016-09-13: 1000 mL

## 2016-09-13 MED ORDER — HYDROMORPHONE HCL 1 MG/ML IJ SOLN
INTRAMUSCULAR | Status: AC
  Filled 2016-09-13: qty 1

## 2016-09-13 MED ORDER — OXYCODONE-ACETAMINOPHEN 5-325 MG PO TABS: 2.0000 | ORAL_TABLET | Freq: Once | ORAL | Status: DC

## 2016-09-13 MED ORDER — FAMOTIDINE IN NACL 20-0.9 MG/50ML-% IV SOLN: 20.0000 mg | Freq: Once | INTRAVENOUS | Status: DC

## 2016-09-13 MED ORDER — OXYCODONE-ACETAMINOPHEN 5-325 MG PO TABS: 1.0000 | ORAL_TABLET | ORAL | 0 refills | Status: DC | PRN

## 2016-09-13 MED ORDER — LACTATED RINGERS IV SOLN
INTRAVENOUS | Status: DC
  Administered 2016-09-13 (×3): via INTRAVENOUS

## 2016-09-13 MED ORDER — MEPERIDINE HCL 25 MG/ML IJ SOLN: 6.2500 mg | INTRAMUSCULAR | Status: DC | PRN

## 2016-09-13 MED ORDER — SOD CITRATE-CITRIC ACID 500-334 MG/5ML PO SOLN: 30.0000 mL | Freq: Once | ORAL | Status: DC

## 2016-09-13 MED ORDER — PROPOFOL 10 MG/ML IV BOLUS
INTRAVENOUS | Status: DC | PRN
  Administered 2016-09-13: 200 mg via INTRAVENOUS

## 2016-09-13 MED ORDER — LACTATED RINGERS IV BOLUS (SEPSIS): 1000.0000 mL | Freq: Once | INTRAVENOUS | Status: DC

## 2016-09-13 MED ORDER — ROCURONIUM BROMIDE 100 MG/10ML IV SOLN
INTRAVENOUS | Status: DC | PRN
  Administered 2016-09-13: 10 mg via INTRAVENOUS
  Administered 2016-09-13: 20 mg via INTRAVENOUS
  Administered 2016-09-13: 40 mg via INTRAVENOUS

## 2016-09-13 MED ORDER — PHENYLEPHRINE HCL 10 MG/ML IJ SOLN
INTRAMUSCULAR | Status: DC | PRN
  Administered 2016-09-13 (×4): 40 ug via INTRAVENOUS

## 2016-09-13 MED ORDER — KETOROLAC TROMETHAMINE 30 MG/ML IJ SOLN
INTRAMUSCULAR | Status: AC
  Filled 2016-09-13: qty 1

## 2016-09-13 MED ORDER — SUGAMMADEX SODIUM 200 MG/2ML IV SOLN
INTRAVENOUS | Status: AC
  Filled 2016-09-13: qty 2

## 2016-09-13 MED ORDER — ONDANSETRON HCL 4 MG/2ML IJ SOLN
INTRAMUSCULAR | Status: DC | PRN
  Administered 2016-09-13: 4 mg via INTRAVENOUS

## 2016-09-13 SURGICAL SUPPLY — 27 items
DERMABOND ADVANCED (GAUZE/BANDAGES/DRESSINGS) ×1
DERMABOND ADVANCED .7 DNX12 (GAUZE/BANDAGES/DRESSINGS) ×1 IMPLANT
DRSG OPSITE POSTOP 3X4 (GAUZE/BANDAGES/DRESSINGS) ×2 IMPLANT
GLOVE BIOGEL M 6.5 STRL (GLOVE) ×6 IMPLANT
GLOVE BIOGEL PI IND STRL 6.5 (GLOVE) ×2 IMPLANT
GLOVE BIOGEL PI IND STRL 7.0 (GLOVE) ×3 IMPLANT
GLOVE BIOGEL PI INDICATOR 6.5 (GLOVE) ×2
GLOVE BIOGEL PI INDICATOR 7.0 (GLOVE) ×3
GLOVE ECLIPSE 7.5 STRL STRAW (GLOVE) ×4 IMPLANT
GLOVE SURG SS PI 7.0 STRL IVOR (GLOVE) ×6 IMPLANT
GOWN SURGICAL LARGE (GOWNS) ×4 IMPLANT
GOWN SURGICAL XLG (GOWNS) ×2 IMPLANT
LIGASURE VESSEL 5MM BLUNT TIP (ELECTROSURGICAL) ×2 IMPLANT
PACK LAPAROSCOPY BASIN (CUSTOM PROCEDURE TRAY) ×2 IMPLANT
PACK TRENDGUARD 450 HYBRID PRO (MISCELLANEOUS) ×1 IMPLANT
POUCH SPECIMEN RETRIEVAL 10MM (ENDOMECHANICALS) ×2 IMPLANT
PROTECTOR NERVE ULNAR (MISCELLANEOUS) ×2 IMPLANT
SET IRRIG TUBING LAPAROSCOPIC (IRRIGATION / IRRIGATOR) ×2 IMPLANT
SUT VIC AB 4-0 PS2 27 (SUTURE) ×4 IMPLANT
SUT VICRYL 0 UR6 27IN ABS (SUTURE) ×4 IMPLANT
TOWEL OR 17X26 10 PK STRL BLUE (TOWEL DISPOSABLE) ×4 IMPLANT
TRENDGUARD 450 HYBRID PRO PACK (MISCELLANEOUS) ×2
TROCAR 12M 150ML BLUNT (TROCAR) ×2 IMPLANT
TROCAR BALLN GELPORT 12X130M (ENDOMECHANICALS) ×2 IMPLANT
TROCAR XCEL NON-BLD 11X100MML (ENDOMECHANICALS) ×2 IMPLANT
TROCAR XCEL NON-BLD 5MMX100MML (ENDOMECHANICALS) ×4 IMPLANT
WARMER LAPAROSCOPE (MISCELLANEOUS) ×2 IMPLANT

## 2016-09-13 NOTE — Anesthesia Procedure Notes (Signed)
Procedure Name: Intubation Date/Time: 09/13/2016 6:33 AM Performed by: Junious SilkGILBERT, Margalit Leece Pre-anesthesia Checklist: Patient identified, Emergency Drugs available, Suction available, Patient being monitored and Timeout performed Patient Re-evaluated:Patient Re-evaluated prior to inductionOxygen Delivery Method: Circle system utilized Preoxygenation: Pre-oxygenation with 100% oxygen Intubation Type: IV induction and Rapid sequence Laryngoscope Size: Miller and 2 Grade View: Grade II Tube type: Oral Tube size: 7.0 mm Number of attempts: 1 Airway Equipment and Method: Stylet Placement Confirmation: ETT inserted through vocal cords under direct vision,  positive ETCO2,  CO2 detector and breath sounds checked- equal and bilateral Secured at: 21 cm Tube secured with: Tape Dental Injury: Teeth and Oropharynx as per pre-operative assessment

## 2016-09-13 NOTE — Discharge Instructions (Signed)
DISCHARGE INSTRUCTIONS: Laparoscopy ° °The following instructions have been prepared to help you care for yourself upon your return home today. ° °Wound care: °• Do not get the incision wet for the first 24 hours. The incision should be kept clean and dry. °• The Band-Aids or dressings may be removed the day after surgery. °• Should the incision become sore, red, and swollen after the first week, check with your doctor. ° °Personal hygiene: °• Shower the day after your procedure. ° °Activity and limitations: °• Do NOT drive or operate any equipment today. °• Do NOT lift anything more than 15 pounds for 2-3 weeks after surgery. °• Do NOT rest in bed all day. °• Walking is encouraged. Walk each day, starting slowly with 5-minute walks 3 or 4 times a day. Slowly increase the length of your walks. °• Walk up and down stairs slowly. °• Do NOT do strenuous activities, such as golfing, playing tennis, bowling, running, biking, weight lifting, gardening, mowing, or vacuuming for 2-4 weeks. Ask your doctor when it is okay to start. ° °Diet: Eat a light meal as desired this evening. You may resume your usual diet tomorrow. ° °Return to work: This is dependent on the type of work you do. For the most part you can return to a desk job within a week of surgery. If you are more active at work, please discuss this with your doctor. ° °What to expect after your surgery: You may have a slight burning sensation when you urinate on the first day. You may have a very small amount of blood in the urine. Expect to have a small amount of vaginal discharge/light bleeding for 1-2 weeks. It is not unusual to have abdominal soreness and bruising for up to 2 weeks. You may be tired and need more rest for about 1 week. You may experience shoulder pain for 24-72 hours. Lying flat in bed may relieve it. ° °Call your doctor for any of the following: °• Develop a fever of 100.4 or greater °• Inability to urinate 6 hours after discharge from  hospital °• Severe pain not relieved by pain medications °• Persistent of heavy bleeding at incision site °• Redness or swelling around incision site after a week °• Increasing nausea or vomiting ° °Patient Signature________________________________________ °Nurse Signature_________________________________________DISCHARGE INSTRUCTIONS: Laparoscopy ° °The following instructions have been prepared to help you care for yourself upon your return home today. ° °Wound care: °• Do not get the incision wet for the first 24 hours. The incision should be kept clean and dry. °• The Band-Aids or dressings may be removed the day after surgery. °• Should the incision become sore, red, and swollen after the first week, check with your doctor. ° °Personal hygiene: °• Shower the day after your procedure. ° °Activity and limitations: °• Do NOT drive or operate any equipment today. °• Do NOT lift anything more than 15 pounds for 2-3 weeks after surgery. °• Do NOT rest in bed all day. °• Walking is encouraged. Walk each day, starting slowly with 5-minute walks 3 or 4 times a day. Slowly increase the length of your walks. °• Walk up and down stairs slowly. °• Do NOT do strenuous activities, such as golfing, playing tennis, bowling, running, biking, weight lifting, gardening, mowing, or vacuuming for 2-4 weeks. Ask your doctor when it is okay to start. ° °Diet: Eat a light meal as desired this evening. You may resume your usual diet tomorrow. ° °Return to work: This is dependent on   the type of work you do. For the most part you can return to a desk job within a week of surgery. If you are more active at work, please discuss this with your doctor. ° °What to expect after your surgery: You may have a slight burning sensation when you urinate on the first day. You may have a very small amount of blood in the urine. Expect to have a small amount of vaginal discharge/light bleeding for 1-2 weeks. It is not unusual to have abdominal soreness  and bruising for up to 2 weeks. You may be tired and need more rest for about 1 week. You may experience shoulder pain for 24-72 hours. Lying flat in bed may relieve it. ° °Call your doctor for any of the following: °• Develop a fever of 100.4 or greater °• Inability to urinate 6 hours after discharge from hospital °• Severe pain not relieved by pain medications °• Persistent of heavy bleeding at incision site °• Redness or swelling around incision site after a week °• Increasing nausea or vomiting ° °Patient Signature________________________________________ °Nurse Signature_________________________________________ °

## 2016-09-13 NOTE — H&P (Signed)
26 y.o. yo complains of abdominal/pelvic pain that became severe about 2am today.  Pt was diagnosed with a left ectopic pregnancy approx 2 weeks ago and was treated with MTX.  She had an initial rise on day for and a 15% decline by day 7.  She followed up in the office and had further decline of BHCG from approx 2500 to 1700.  Almost exactly 1 yr ago she had ectopic on right that was treated with salpingectomy.  At this time she reports non-worsening pain, no dizziness, SOB, CP, palpitations/racing heart or other complaints.  Past Medical History:  Diagnosis Date  . Anxiety   . Arthritis   . Asthma   . Bipolar II disorder (HCC)   . Depression   . Ectopic fetus   . Fibromyalgia   . PCOS (polycystic ovarian syndrome) 2015  . Raynauds syndrome 2015   Past Surgical History:  Procedure Laterality Date  . cyst removed from throat    . DIAGNOSTIC LAPAROSCOPY WITH REMOVAL OF ECTOPIC PREGNANCY    . LAPAROSCOPY N/A 09/11/2015   Procedure: LAPAROSCOPY OPERATIVE, Right Salpingectomy with Removal Ectopic Pregnancy;  Surgeon: Waynard ReedsKendra Ross, MD;  Location: [facility];  Service: Gynecology;  Laterality: N/A;  . THYROIDECTOMY, PARTIAL  2010    Social History   Social History  . Marital status: Married    Spouse name: N/A  . Number of children: N/A  . Years of education: N/A   Occupational History  . Not on file.   Social History Main Topics  . Smoking status: Never Smoker  . Smokeless tobacco: Never Used  . Alcohol use No  . Drug use: No  . Sexual activity: Yes    Birth control/ protection: None   Other Topics Concern  . Not on file   Social History Narrative  . No narrative on file    No current facility-administered medications on file prior to encounter.    Current Outpatient Prescriptions on File Prior to Encounter  Medication Sig Dispense Refill  . albuterol (PROVENTIL HFA;VENTOLIN HFA) 108 (90 Base) MCG/ACT inhaler Inhale 1-2 puffs into the lungs every 6 (six) hours as needed for  wheezing or shortness of breath.    . docusate sodium (COLACE) 100 MG capsule Take 300 mg by mouth at bedtime.    . hydroxychloroquine (PLAQUENIL) 200 MG tablet Take 400 mg by mouth at bedtime.     . Prenatal Vit-Fe Fumarate-FA (MULTIVITAMIN-PRENATAL) 27-0.8 MG TABS tablet Take 1 tablet by mouth at bedtime.       Allergies  Allergen Reactions  . Cymbalta [Duloxetine Hcl] Shortness Of Breath  . Norvasc [Amlodipine Besylate] Shortness Of Breath and Other (See Comments)    Reaction:  Tightness in chest     @VITALS2 @  Lungs: clear to ascultation Cor:  RRR Abdomen:  soft, diffusely tender LLQ >RLQ, nondistended. Ex:  no cords, erythema Pelvic:  Deferred to OR  A:  Admit for laparoscopic left salpingectomy possible laparotomy   P: All risks, benefits and alternatives d/w patient and she desires to proceed.   Pt aware that salpingostomy is an option, but given this is her 3rd ectopics she prefers salpingectomy understanding the fertility implications,  type and cross for 2 units ordered Pt recently received rhogam, antibody test +, most likely do to this Hb declined from about 13 to 11.8 since initial visit to MAU Pt appears in NAD. Marland Kitchen.Philip AspenALLAHAN, Bernarr Longsworth

## 2016-09-13 NOTE — Anesthesia Postprocedure Evaluation (Addendum)
Anesthesia Post Note  Patient: Jamie Waters  Procedure(s) Performed: Procedure(s) (LRB): DIAGNOSTIC LAPAROSCOPY WITH REMOVAL OF ECTOPIC PREGNANCY (Left)  Patient location during evaluation: PACU Anesthesia Type: General Level of consciousness: awake and alert Pain management: pain level controlled Vital Signs Assessment: post-procedure vital signs reviewed and stable Respiratory status: spontaneous breathing, nonlabored ventilation, respiratory function stable and patient connected to nasal cannula oxygen Cardiovascular status: blood pressure returned to baseline and stable Postop Assessment: no signs of nausea or vomiting Anesthetic complications: no        Last Vitals:  Vitals:   09/13/16 1000 09/13/16 1015  BP: (!) 90/50 (!) 100/56  Pulse: 64 (!) 112  Resp: 13 16  Temp:      Last Pain:  Vitals:   09/13/16 0249  TempSrc: Oral  PainSc: 10-Worst pain ever   Pain Goal:                 Jamie Waters

## 2016-09-13 NOTE — Anesthesia Preprocedure Evaluation (Signed)
Anesthesia Evaluation  Patient identified by MRN, date of birth, ID band Patient awake    Reviewed: Allergy & Precautions, NPO status , Patient's Chart, lab work & pertinent test results  Airway Mallampati: II  TM Distance: >3 FB Neck ROM: Full    Dental no notable dental hx.    Pulmonary asthma ,    Pulmonary exam normal breath sounds clear to auscultation       Cardiovascular negative cardio ROS Normal cardiovascular exam Rhythm:Regular Rate:Normal     Neuro/Psych negative neurological ROS  negative psych ROS   GI/Hepatic negative GI ROS, Neg liver ROS,   Endo/Other  negative endocrine ROS  Renal/GU negative Renal ROS  negative genitourinary   Musculoskeletal  (+) Fibromyalgia -  Abdominal   Peds negative pediatric ROS (+)  Hematology negative hematology ROS (+)   Anesthesia Other Findings   Reproductive/Obstetrics negative OB ROS                             Anesthesia Physical Anesthesia Plan  ASA: II and emergent  Anesthesia Plan: General   Post-op Pain Management:    Induction: Intravenous, Rapid sequence and Cricoid pressure planned  Airway Management Planned: Oral ETT  Additional Equipment:   Intra-op Plan:   Post-operative Plan: Extubation in OR  Informed Consent: I have reviewed the patients History and Physical, chart, labs and discussed the procedure including the risks, benefits and alternatives for the proposed anesthesia with the patient or authorized representative who has indicated his/her understanding and acceptance.   Dental advisory given  Plan Discussed with: CRNA  Anesthesia Plan Comments:         Anesthesia Quick Evaluation

## 2016-09-13 NOTE — Transfer of Care (Signed)
Immediate Anesthesia Transfer of Care Note  Patient: Jamie Waters  Procedure(s) Performed: Procedure(s): DIAGNOSTIC LAPAROSCOPY WITH REMOVAL OF ECTOPIC PREGNANCY (Left)  Patient Location: PACU  Anesthesia Type:General  Level of Consciousness: awake, alert  and oriented  Airway & Oxygen Therapy: Patient Spontanous Breathing and Patient connected to nasal cannula oxygen  Post-op Assessment: Report given to RN and Post -op Vital signs reviewed and stable  Post vital signs: Reviewed and stable  Last Vitals:  Vitals:   09/13/16 0501 09/13/16 0505  BP: 114/65   Pulse: 85 65  Resp:    Temp:      Last Pain:  Vitals:   09/13/16 0249  TempSrc: Oral  PainSc: 10-Worst pain ever         Complications: No apparent anesthesia complications

## 2016-09-13 NOTE — MAU Provider Note (Signed)
History    First Provider Initiated Contact with Patient 09/13/16 0335      Chief Complaint:  Ectopic Pregnancy   GLORIANA PILTZ is  26 y.o. G4P0030 Patient's last menstrual period was 07/20/2016.  Patient is here for Increased abdominal pain w/ known 1.4 cm left ectopic pregnancy Tx'd w/ MTX 08/29/16. Quant dropped 15 % from day #4 to day #7 post-MTX. She is [redacted]w[redacted]d weeks gestation  by LMP.    Blood type A neg. Received Rhophylac 08/29/16. Hx Right Salpingectomy 2016 for ruptured ectopic.   Since her last visit, the patient is with new complaint.     ROS Abdomin Pain: severe LLQ pain Vaginal bleeding: lighter than period.   Passage of clots or tissue: None Dizziness: None  Her previous Quantitative HCG values are: Results for CONLEIGH, HEINLEIN (MRN [id number]) as of 09/13/2016 02:54  Ref. Range 08/19/2016 16:34 08/23/2016 22:19 08/29/2016 12:00 09/01/2016 13:42 09/04/2016 10:40  HCG, Beta Chain, Quant, S Latest Ref Range: <5 mIU/mL 97 (H) 528 (H) 3,465 (H) MTX 4,266 (H) Day#4 3,616 (H) Day#7     Physical Exam   BP 132/75 (BP Location: Left Arm)   Pulse 119   Temp 97.9 F (36.6 C) (Oral)   Resp 20   LMP 07/20/2016  Constitutional: Well-nourished female in no apparent distress. No pallor Neuro: Alert and oriented 4 Cardiovascular: Normal rate Respiratory: Normal effort and rate Abdomen: Soft, nontender Gynecological Exam: examination not indicated  Labs: Results for orders placed or performed during the hospital encounter of 09/13/16 (from the past 24 hour(s))  CBC   Collection Time: 09/13/16  3:48 AM  Result Value Ref Range   WBC 10.4 4.0 - 10.5 K/uL   RBC 3.85 (L) 3.87 - 5.11 MIL/uL   Hemoglobin 11.8 (L) 12.0 - 15.0 g/dL   HCT 40.9 (L) 81.1 - 91.4 %   MCV 91.9 78.0 - 100.0 fL   MCH 30.6 26.0 - 34.0 pg   MCHC 33.3 30.0 - 36.0 g/dL   RDW 78.2 95.6 - 21.3 %   Platelets 297 150 - 400 K/uL  Comprehensive metabolic panel   Collection Time: 09/13/16  3:48 AM  Result  Value Ref Range   Sodium 135 135 - 145 mmol/L   Potassium 3.9 3.5 - 5.1 mmol/L   Chloride 102 101 - 111 mmol/L   CO2 24 22 - 32 mmol/L   Glucose, Bld 105 (H) 65 - 99 mg/dL   BUN 8 6 - 20 mg/dL   Creatinine, Ser 0.86 0.44 - 1.00 mg/dL   Calcium 9.0 8.9 - 57.8 mg/dL   Total Protein 7.4 6.5 - 8.1 g/dL   Albumin 4.2 3.5 - 5.0 g/dL   AST 18 15 - 41 U/L   ALT 19 14 - 54 U/L   Alkaline Phosphatase 47 38 - 126 U/L   Total Bilirubin 0.8 0.3 - 1.2 mg/dL   GFR calc non Af Amer >60 >60 mL/min   GFR calc Af Amer >60 >60 mL/min   Anion gap 9 5 - 15  hCG, quantitative, pregnancy   Collection Time: 09/13/16  3:48 AM  Result Value Ref Range   hCG, Beta Chain, Quant, S 1,706 (H) <5 mIU/mL  Type and screen   Collection Time: 09/13/16  3:48 AM  Result Value Ref Range   ABO/RH(D) A NEG    Antibody Screen POS    Sample Expiration 09/16/2016    DAT, IgG NEG     Ultrasound Studies:   US Ob  Transvaginal  Result Date: 09/13/2016 CLINICAL DATA:  Follow-up ectopic pregnancy. Post methotrexate. Severe pelvic pain greater on the left. Quantitative beta HCG is pending but on January 2 was 3,616 compared with 4,266 on December 30th. History of 2 prior ectopic pregnancies post left salpingectomy. EXAM: TRANSVAGINAL OB ULTRASOUND TECHNIQUE: Transvaginal ultrasound was performed for complete evaluation of the gestation as well as the maternal uterus, adnexal regions, and pelvic cul-de-sac. COMPARISON:  08/29/2016 FINDINGS: Intrauterine gestational sac: None identified Yolk sac:  Not identified Embryo:  Not identified Cardiac Activity: Not identified Maternal uterus/adnexae: There is a focal heterogeneous myometrial lesion in the posterior uterus measuring 1.6 cm maximal diameter consistent with a fibroid. This is unchanged since previous study. Endometrial stripe is normal in appearance. No endometrial fluid or thickening. Right ovary contains a simple cyst measuring 3.3 cm diameter. Left ovary contains a simple cyst  measuring 2 cm maximal diameter. Medial to the left ovary, there is a left adnexal mass measuring 2.5 x 3.1 x 2.9 cm. This corresponds to the ectopic pregnancy on prior study. The lesion remains present with central hypoechoic area, smaller than on prior study. The yolk sac is no longer definitively identified. No flow is demonstrated in around this area on color flow Doppler imaging. Moderate amount of free fluid is demonstrated in the pelvis with slight complexity, possibly hemorrhagic. IMPRESSION: Left adnexal ectopic pregnancy is again demonstrated but is less well defined and smaller than on prior study. Bilateral simple ovarian cysts are likely functional. Moderate complex fluid in the pelvis could indicate hemorrhagic fluid. No intrauterine pregnancy is demonstrated. Electronically Signed   By: Burman NievesWilliam  Stevens M.D.   On: 09/13/2016 04:48   [place] Ob Transvaginal  Result Date: 08/29/2016 CLINICAL DATA:  Abdominal pain.  Evaluate for ectopic pregnancy. EXAM: TRANSVAGINAL OB ULTRASOUND TECHNIQUE: Transvaginal ultrasound was performed for complete evaluation of the gestation as well as the maternal uterus, adnexal regions, and pelvic cul-de-sac. COMPARISON:  None. FINDINGS: No intrauterine pregnancy is identified. A 1.5 cm fibroid is seen posteriorly in the uterine fundus. The right ovary is normal in appearance. The left ovary is also normal in appearance. Both ovaries contain follicles but no solid masses. There is an abnormal gestational sac containing a yolk sac adjacent to the left ovary consistent with an ectopic pregnancy. Trace fluid is seen adjacent to the left ovary. IMPRESSION: 1. Ectopic pregnancy adjacent to the left ovary. The gestational sac measures 1.4 x 1.2 x 1.1 cm, consistent with a gestational age of [redacted] weeks and 2 days. This finding was called to the patient's clinician, Judeth HornErin Lawrence. Electronically Signed   By: Gerome Samavid  Williams III M.D   On: 08/29/2016 14:43    MAU  course/MDM: Quantitative hCG, CBC, US, Type and screen, LR bolus, Dilaudid ordered. NPO.   Notified Dr. Claiborne Billingsallahan of Hx, exam, labs, US. Suspect ruptured ectopic.  Last solids 2000. Last water 0130.    Assessment: Concern for ruptured ectopic.  Plan: Dr. Claiborne Billingsallahan will come discuss possible surgery w/ pt.    Dorathy KinsmanVirginia Awilda Covin, CNM 09/13/2016, 3:46 AM  2/3

## 2016-09-13 NOTE — Brief Op Note (Signed)
09/13/2016  8:40 AM  PATIENT:  Jamie Waters  26 y.o. female  PRE-OPERATIVE DIAGNOSIS:  pregnant in tube  POST-OPERATIVE DIAGNOSIS:  LEFT ECTOPIC PREGNANCY  PROCEDURE:  Procedure(s): DIAGNOSTIC LAPAROSCOPY WITH REMOVAL OF ECTOPIC PREGNANCY (Left) via left salpingectomy  SURGEON:  Surgeon(s) and Role:    * Philip AspenSidney Emillee Talsma, DO - Primary    * Waynard ReedsKendra Ross, MD - Assisting  ANESTHESIA:   local and general  EBL:  Total I/O In: 2000 [I.V.:2000] Out: 400 [Urine:300; Blood:100]  BLOOD ADMINISTERED:none  LOCAL MEDICATIONS USED:  MARCAINE    and Amount: 3 ml  SPECIMEN:  Source of Specimen:  left fallopian tube  DISPOSITION OF SPECIMEN:  PATHOLOGY  COUNTS:  YES  PLAN OF CARE: Discharge to home after PACU  PATIENT DISPOSITION:  PACU - hemodynamically stable.   Delay start of Pharmacological VTE agent (>24hrs) due to surgical blood loss or risk of bleeding: not applicable

## 2016-09-13 NOTE — MAU Note (Signed)
Pt presents complaining of abdominal pain that started at 1130. Thought it was constipation so she took milk of magnesia and went to bed. States she woke up at 0130 and was in extreme pain. States she started having vaginal bleeding at that time too. States the pain is worse on the left side.

## 2016-09-14 ENCOUNTER — Encounter (HOSPITAL_COMMUNITY): Payer: Self-pay | Admitting: Obstetrics and Gynecology

## 2016-09-14 LAB — TYPE AND SCREEN
ABO/RH(D): A NEG
Antibody Screen: POSITIVE
DAT, IgG: NEGATIVE
Unit division: 0
Unit division: 0

## 2016-09-15 ENCOUNTER — Inpatient Hospital Stay (HOSPITAL_COMMUNITY)
Admission: AD | Admit: 2016-09-15 | Discharge: 2016-09-15 | Disposition: A | Discharge status: Home / Self Care | Payer: BLUE CROSS/BLUE SHIELD | Source: Ambulatory Visit | Attending: Obstetrics & Gynecology | Admitting: Obstetrics & Gynecology

## 2016-09-15 ENCOUNTER — Encounter (HOSPITAL_COMMUNITY): Payer: Self-pay

## 2016-09-15 DIAGNOSIS — T8189XA Other complications of procedures, not elsewhere classified, initial encounter: Secondary | ICD-10-CM | POA: Diagnosis present

## 2016-09-15 DIAGNOSIS — L231 Allergic contact dermatitis due to adhesives: Secondary | ICD-10-CM

## 2016-09-15 DIAGNOSIS — Z79899 Other long term (current) drug therapy: Secondary | ICD-10-CM | POA: Diagnosis not present

## 2016-09-15 DIAGNOSIS — Y838 Other surgical procedures as the cause of abnormal reaction of the patient, or of later complication, without mention of misadventure at the time of the procedure: Secondary | ICD-10-CM | POA: Insufficient documentation

## 2016-09-15 DIAGNOSIS — IMO0001 Reserved for inherently not codable concepts without codable children: Secondary | ICD-10-CM

## 2016-09-15 DIAGNOSIS — T814XXA Infection following a procedure, initial encounter: Secondary | ICD-10-CM

## 2016-09-15 MED ORDER — CEPHALEXIN 500 MG PO CAPS: 500.0000 mg | ORAL_CAPSULE | Freq: Two times a day (BID) | ORAL | 0 refills | Status: AC

## 2016-09-15 NOTE — MAU Note (Signed)
Patient presents with umbilical incision drainage which started today.  She is s/p surgery for ectopic pregnancy on 09/13/16

## 2016-09-15 NOTE — MAU Provider Note (Signed)
History     CSN: [id number]  Arrival date and time: 09/15/16 1736   First Provider Initiated Contact with Patient 09/15/16 1816      Chief Complaint  Patient presents with  . Drainage from Incision   Post-op 2 days from laprascopic left salpingectomy for ectopic here with incision drainage. She reports removing umbilical dsg today and found brown/yellow drainage pooling in umbilicus and on bandage. She also reports wound is hot to touch and itchy. Post op pain is minmal and well controlled. She denies fever or chills.    Past Medical History:  Diagnosis Date  . Anxiety   . Arthritis   . Asthma   . Bipolar II disorder (HCC)   . Depression   . Ectopic fetus   . Fibromyalgia   . PCOS (polycystic ovarian syndrome) 2015  . Raynauds syndrome 2015    Past Surgical History:  Procedure Laterality Date  . cyst removed from throat    . DIAGNOSTIC LAPAROSCOPY WITH REMOVAL OF ECTOPIC PREGNANCY    . DIAGNOSTIC LAPAROSCOPY WITH REMOVAL OF ECTOPIC PREGNANCY Left 09/13/2016   Procedure: DIAGNOSTIC LAPAROSCOPY WITH REMOVAL OF ECTOPIC PREGNANCY;  Surgeon: Philip AspenSidney Callahan, DO;  Location: [facility];  Service: Gynecology;  Laterality: Left;  . LAPAROSCOPY N/A 09/11/2015   Procedure: LAPAROSCOPY OPERATIVE, Right Salpingectomy with Removal Ectopic Pregnancy;  Surgeon: Waynard ReedsKendra Ross, MD;  Location: [facility];  Service: Gynecology;  Laterality: N/A;  . THYROIDECTOMY, PARTIAL  2010    Family History  Problem Relation Age of Onset  . Dementia Father   . Alcohol abuse Father   . Bipolar disorder Sister   . Autism spectrum disorder Sister   . Lupus Maternal Grandmother   . Cancer Maternal Grandmother   . Cancer Paternal Grandfather     Social History  Substance Use Topics  . Smoking status: Never Smoker  . Smokeless tobacco: Never Used  . Alcohol use No    Allergies:  Allergies  Allergen Reactions  . Cymbalta [Duloxetine Hcl] Shortness Of Breath  . Norvasc [Amlodipine Besylate] Shortness Of  Breath and Other (See Comments)    Reaction:  Tightness in chest     Prescriptions Prior to Admission  Medication Sig Dispense Refill Last Dose  . albuterol (PROVENTIL HFA;VENTOLIN HFA) 108 (90 Base) MCG/ACT inhaler Inhale 1-2 puffs into the lungs every 6 (six) hours as needed for wheezing or shortness of breath.   Taking  . docusate sodium (COLACE) 100 MG capsule Take 300 mg by mouth at bedtime.   Taking  . hydroxychloroquine (PLAQUENIL) 200 MG tablet Take 400 mg by mouth at bedtime.    Taking  . oxyCODONE-acetaminophen (ROXICET) 5-325 MG tablet Take 1-2 tablets by mouth every 4 (four) hours as needed for severe pain. 30 tablet 0   . Prenatal Vit-Fe Fumarate-FA (MULTIVITAMIN-PRENATAL) 27-0.8 MG TABS tablet Take 1 tablet by mouth at bedtime.    Not Taking    Review of Systems  Constitutional: Negative for chills and fever.  Gastrointestinal: Negative for abdominal pain.  Skin: Positive for wound (drainage).   Physical Exam   Blood pressure 127/73, pulse 87, temperature 98.2 F (36.8 C), resp. rate 18, last menstrual period 07/20/2016, unknown if currently breastfeeding.  Physical Exam  Nursing note and vitals reviewed. Constitutional: She is oriented to person, place, and time. She appears well-developed and well-nourished. No distress.  HENT:  Head: Normocephalic.  Neck: Normal range of motion.  Cardiovascular: Normal rate.   Respiratory: Effort normal.  GI: Soft. She exhibits no distension.  There is no tenderness.  Musculoskeletal: Normal range of motion.  Neurological: She is alert and oriented to person, place, and time.  Skin: Skin is warm and dry.     Erythema to incision line on umbilicus w/o edema. Transparent yellow fluid pooled in umbilicus. Incision to RLQ and LLQ clean, dry, intact, and w/o erythema or edema. Seperate area 3x3 cm inferior and left of umbilicus erythematous and slightly raised   Psychiatric: She has a normal mood and affect.   MAU Course    Procedures  MDM Suspected skin infection of wound. Area of erythema below wound likely contact dermatitis from dressing. Culture obtained. Presentation, clinical findings, and plan discussed with Dr. Mora Appl. Stable for discharge home.  Assessment and Plan   1. Postoperative wound infection, initial encounter   2. Allergic contact dermatitis due to adhesives    Discharge home Follow up in office in 2 days with Dr. Claiborne Billings Hydrocortisone 1% cream to skin TID  Allergies as of 09/15/2016      Reactions   Cymbalta [duloxetine Hcl] Shortness Of Breath   Norvasc [amlodipine Besylate] Shortness Of Breath, Other (See Comments)   Reaction:  Tightness in chest       Medication List    TAKE these medications   albuterol 108 (90 Base) MCG/ACT inhaler Commonly known as:  PROVENTIL HFA;VENTOLIN HFA Inhale 1-2 puffs into the lungs every 6 (six) hours as needed for wheezing or shortness of breath.   cephALEXin 500 MG capsule Commonly known as:  KEFLEX Take 1 capsule (500 mg total) by mouth 2 (two) times daily.   docusate sodium 100 MG capsule Commonly known as:  COLACE Take 300 mg by mouth at bedtime.   hydroxychloroquine 200 MG tablet Commonly known as:  PLAQUENIL Take 400 mg by mouth at bedtime.   multivitamin-prenatal 27-0.8 MG Tabs tablet Take 1 tablet by mouth at bedtime.   oxyCODONE-acetaminophen 5-325 MG tablet Commonly known as:  ROXICET Take 1-2 tablets by mouth every 4 (four) hours as needed for severe pain.      Donette Larry, CNM 09/15/2016, 6:27 PM

## 2016-09-15 NOTE — Discharge Instructions (Signed)
   Contact Dermatitis Introduction Dermatitis is redness, soreness, and swelling (inflammation) of the skin. Contact dermatitis is a reaction to certain substances that touch the skin. You either touched something that irritated your skin, or you have allergies to something you touched. Follow these instructions at home: Skin Care  Moisturize your skin as needed.  Apply cool compresses to the affected areas.  Try taking a bath with:  Epsom salts. Follow the instructions on the package. You can get these at a pharmacy or grocery store.  Baking soda. Pour a small amount into the bath as told by your doctor.  Colloidal oatmeal. Follow the instructions on the package. You can get this at a pharmacy or grocery store.  Try applying baking soda paste to your skin. Stir water into baking soda until it looks like paste.  Do not scratch your skin.  Bathe less often.  Bathe in lukewarm water. Avoid using hot water. Medicines  Take or apply over-the-counter and prescription medicines only as told by your doctor.  If you were prescribed an antibiotic medicine, take or apply your antibiotic as told by your doctor. Do not stop taking the antibiotic even if your condition starts to get better. General instructions  Keep all follow-up visits as told by your doctor. This is important.  Avoid the substance that caused your reaction. If you do not know what caused it, keep a journal to try to track what caused it. Write down:  What you eat.  What cosmetic products you use.  What you drink.  What you wear in the affected area. This includes jewelry.  If you were given a bandage (dressing), take care of it as told by your doctor. This includes when to change and remove it. Contact a doctor if:  You do not get better with treatment.  Your condition gets worse.  You have signs of infection such as:  Swelling.  Tenderness.  Redness.  Soreness.  Warmth.  You have a fever.  You  have new symptoms. Get help right away if:  You have a very bad headache.  You have neck pain.  Your neck is stiff.  You throw up (vomit).  You feel very sleepy.  You see red streaks coming from the affected area.  Your bone or joint underneath the affected area becomes painful after the skin has healed.  The affected area turns darker.  You have trouble breathing. This information is not intended to replace advice given to you by your health care provider. Make sure you discuss any questions you have with your health care provider. Document Released: 06/17/2009 Document Revised: 01/26/2016 Document Reviewed: 01/05/2015  2017 Elsevier  

## 2016-09-18 LAB — AEROBIC CULTURE W GRAM STAIN (SUPERFICIAL SPECIMEN)
Culture: NO GROWTH
Gram Stain: NONE SEEN

## 2016-09-20 NOTE — Op Note (Signed)
NAMEMARRIE, CHANDRA               ACCOUNT NO.:  [id number]  MEDICAL RECORD NO.:  [id number]  LOCATION:                                 FACILITY:  PHYSICIAN:  Philip Aspen, DO    DATE OF BIRTH:  1990-11-19  DATE OF PROCEDURE:  09/13/2016 DATE OF DISCHARGE:  09/13/2016                              OPERATIVE REPORT   PREOPERATIVE DIAGNOSIS:  Left ectopic pregnancy.  POSTOPERATIVE DIAGNOSIS:  Left ectopic pregnancy.  PROCEDURE:  Diagnostic laparoscopy with left salpingectomy.  SURGEON:  Philip Aspen, DO.  ASSISTANT:  Freddrick March. Tenny Craw, MD.  ANESTHESIA:  Local and general.  IV FLUIDS:  2000 mL.  URINE OUTPUT:  300 mL.  ESTIMATED BLOOD LOSS:  100 mL.  Local medications used 3 mL of 0.25% Marcaine.  SPECIMEN:  Left fallopian tube containing ectopic pregnancy.  FINDINGS:  Scar tissue of the left bowel to the left abdominoperitoneal surface, __________ hemorrhagic ectopic noted, mild to moderate amount of blood clot in the pelvis.  Right tube absent.  Anterior peritoneum inferiorly showed two approximately 1 cm rent in the peritoneum.  The posterior rent had some erythema and nonexpanding retroperitoneal area of blood approximately 1 cm.  Normal-appearing liver and upper abdomen. Omentum with some adherent blood clot, but no visible signs of any injury or active bleeding.  Intestines that was visible appeared normal. Uterus also appeared normal and both ovaries looked normal.  DESCRIPTION OF PROCEDURE:  The patient was taken to the operating room in stable condition where anesthesia was administered and found to be adequate.  She was prepped and draped in the normal sterile fashion in dorsal lithotomy position.  A Foley catheter was placed and speculum inserted.  The cervix had to be dilated slightly to insert an acorn uterine manipulator and the anterior lip of the uterus was grasped with a single-tooth tenaculum.  Attention was then turned to the abdominal portion of  the case.  Marcaine was administered subcutaneously and an 11 mm vertical incision was made within the umbilical fold.  An attempt of Veress needle placement was performed 3 times with initial entrance showing low pressure followed by higher pressure within just a second or 2.  Therefore, method of entry was altered to Hasson method where Metzenbaum scissors were used to __________ due to the patient's body mass index and __________ skin, subcutaneous tissue, peritoneum was difficult to visualize and enter.  Visiport was attempted to be used and again it was difficult to determine if adequate entrance occurred.  At that time, Dr. Waynard Reeds entered the procedure and assisted in peritoneal entry, but she was able to achieve without difficulty digitally.  The umbilical port was placed and camera was used to survey the underlying omentum, upper abdomen, and pelvis.  Findings are noted above.  No active bleeding was noted.  A ligature was used to remove the left fallopian tube.  Irrigation was performed and no further bleeding was found.  The left fallopian tube was removed with the use of Endobag. No additional bleeding or other concerns were seen.  All ports were removed.  The abdomen was desufflated.  A 2-0 Vicryl was used in a running fashion for  the umbilical fascia.  Then all 3 skin closures were performed with 4-0 Monocryl subcuticularly.  The patient tolerated the procedure well, however, had a brief hypotension during the Veress needle attempted enters, that resolved without need for blood products. Please see anesthesia report for further details.  The patient was then taken to recovery in stable condition.  The sponge, lap, and needle counts were correct x2.    ______________________________ Philip AspenSidney Woodrow Drab, DO   ______________________________ Philip AspenSidney Shyasia Funches, DO    [place]/[facility]  D:  09/13/2016  T:  09/14/2016  Job:  [id number]

## 2016-09-21 DIAGNOSIS — T8189XA Other complications of procedures, not elsewhere classified, initial encounter: Secondary | ICD-10-CM | POA: Diagnosis not present

## 2016-09-21 DIAGNOSIS — R21 Rash and other nonspecific skin eruption: Secondary | ICD-10-CM | POA: Diagnosis not present

## 2016-10-10 ENCOUNTER — Ambulatory Visit: Payer: BLUE CROSS/BLUE SHIELD | Admitting: Pediatrics

## 2016-10-11 ENCOUNTER — Encounter: Payer: Self-pay | Admitting: Physician Assistant

## 2016-10-14 ENCOUNTER — Inpatient Hospital Stay (HOSPITAL_COMMUNITY)
Admission: AD | Admit: 2016-10-14 | Discharge: 2016-10-14 | Disposition: A | Discharge status: Home / Self Care | Payer: BLUE CROSS/BLUE SHIELD | Source: Ambulatory Visit | Attending: Obstetrics and Gynecology | Admitting: Obstetrics and Gynecology

## 2016-10-14 ENCOUNTER — Encounter (HOSPITAL_COMMUNITY): Payer: Self-pay | Admitting: *Deleted

## 2016-10-14 DIAGNOSIS — Z79899 Other long term (current) drug therapy: Secondary | ICD-10-CM | POA: Diagnosis not present

## 2016-10-14 DIAGNOSIS — T8130XA Disruption of wound, unspecified, initial encounter: Secondary | ICD-10-CM | POA: Diagnosis not present

## 2016-10-14 DIAGNOSIS — Z7689 Persons encountering health services in other specified circumstances: Secondary | ICD-10-CM | POA: Diagnosis not present

## 2016-10-14 NOTE — MAU Note (Signed)
Suture clipped by provider. Area clean non infected

## 2016-10-14 NOTE — MAU Note (Signed)
Pt had removal of ectopic pregnancy on 09/13/16. Pt reports she has a "string" hanging out of her incision. Area is a little tender and does not seem like it it is healing like the other side.

## 2016-10-14 NOTE — MAU Provider Note (Signed)
History     CSN: [id number]  Arrival date and time: 10/14/16 0050   First Provider Initiated Contact with Patient 10/14/16 0119      Chief Complaint  Patient presents with  . Wound Check   HPI Jamie Waters is a 26 y.o. [id number] female who presents for wound check. Patient had left salpingectomy for ectopic pregnancy last month. Reports tonight she was "picking" at one of her incisions when a white string came out. States area is sore now. No drainage or fever. States she has been in the office 3 times since her surgery d/t issues with her umbilical incision. Currently using nystatin on that incision as prescribed by Dr. Claiborne Billingsallahan & is supposed to follow up. No change with that incision.   Past Medical History:  Diagnosis Date  . Anxiety   . Arthritis   . Asthma   . Bipolar II disorder (HCC)   . Depression   . Ectopic fetus   . Fibromyalgia   . PCOS (polycystic ovarian syndrome) 2015  . Raynauds syndrome 2015    Past Surgical History:  Procedure Laterality Date  . cyst removed from throat    . DIAGNOSTIC LAPAROSCOPY WITH REMOVAL OF ECTOPIC PREGNANCY    . DIAGNOSTIC LAPAROSCOPY WITH REMOVAL OF ECTOPIC PREGNANCY Left 09/13/2016   Procedure: DIAGNOSTIC LAPAROSCOPY WITH REMOVAL OF ECTOPIC PREGNANCY;  Surgeon: Philip AspenSidney Callahan, DO;  Location: [facility];  Service: Gynecology;  Laterality: Left;  . LAPAROSCOPY N/A 09/11/2015   Procedure: LAPAROSCOPY OPERATIVE, Right Salpingectomy with Removal Ectopic Pregnancy;  Surgeon: Waynard ReedsKendra Ross, MD;  Location: [facility];  Service: Gynecology;  Laterality: N/A;  . THYROIDECTOMY, PARTIAL  2010    Family History  Problem Relation Age of Onset  . Dementia Father   . Alcohol abuse Father   . Bipolar disorder Sister   . Autism spectrum disorder Sister   . Lupus Maternal Grandmother   . Cancer Maternal Grandmother   . Cancer Paternal Grandfather     Social History  Substance Use Topics  . Smoking status: Never Smoker  . Smokeless tobacco: Never  Used  . Alcohol use No    Allergies:  Allergies  Allergen Reactions  . Cymbalta [Duloxetine Hcl] Shortness Of Breath  . Norvasc [Amlodipine Besylate] Shortness Of Breath and Other (See Comments)    Reaction:  Tightness in chest     Prescriptions Prior to Admission  Medication Sig Dispense Refill Last Dose  . albuterol (PROVENTIL HFA;VENTOLIN HFA) 108 (90 Base) MCG/ACT inhaler Inhale 1-2 puffs into the lungs every 6 (six) hours as needed for wheezing or shortness of breath.   Taking  . docusate sodium (COLACE) 100 MG capsule Take 300 mg by mouth at bedtime.   Taking  . hydroxychloroquine (PLAQUENIL) 200 MG tablet Take 400 mg by mouth at bedtime.    Taking  . oxyCODONE-acetaminophen (ROXICET) 5-325 MG tablet Take 1-2 tablets by mouth every 4 (four) hours as needed for severe pain. 30 tablet 0   . Prenatal Vit-Fe Fumarate-FA (MULTIVITAMIN-PRENATAL) 27-0.8 MG TABS tablet Take 1 tablet by mouth at bedtime.    Not Taking    Review of Systems  Constitutional: Negative for chills and fever.  Gastrointestinal: Negative for abdominal pain.  Skin: Positive for wound.   Physical Exam   Blood pressure 124/82, pulse 99, temperature 99.1 F (37.3 C), resp. rate 18, height 5' (1.524 m), weight 161 lb (73 kg), unknown if currently breastfeeding.  Physical Exam  Nursing note and vitals reviewed. Constitutional: She is oriented  to person, place, and time. She appears well-developed and well-nourished. No distress.  HENT:  Head: Normocephalic and atraumatic.  Eyes: Conjunctivae are normal. Right eye exhibits no discharge. Left eye exhibits no discharge. No scleral icterus.  Neck: Normal range of motion.  Cardiovascular: Normal rate, regular rhythm and normal heart sounds.   No murmur heard. Respiratory: Effort normal and breath sounds normal. No respiratory distress. She has no wheezes.  GI: Soft.  Neurological: She is alert and oriented to person, place, and time.  Skin: Skin is warm and dry.  She is not diaphoretic.  <0.5 cm of white suture protruding from right abdominal wound. No drainage or heat. Slightly erythematous immediately surrounding the incision.   Psychiatric: She has a normal mood and affect. Her behavior is normal. Judgment and thought content normal.    MAU Course  Procedures  MDM VSS, NAD Unable to remove suture -- trimmed down S/w Dr. Henderson Cloud. Ok to discharge home  Assessment and Plan  A; 1. Spitting suture, initial encounter    P: Discharge home F/u with gyn as scheduled -- discussed reasons to call office PRN before next appointment  Judeth Horn 10/14/2016, 1:18 AM

## 2016-10-14 NOTE — Discharge Instructions (Signed)
Sutured Wound Care  Sutures are stitches that can be used to close wounds. Taking care of your wound properly can help to prevent pain and infection. It can also help your wound to heal more quickly.  How is this treated?  Wound Care  · Keep the wound clean and dry.  · If you were given a bandage (dressing), you should change it at least once per day or as directed by your health care provider. You should also change it if it becomes wet or dirty.  · Keep the wound completely dry for the first 24 hours or as directed by your health care provider. After that time, you may shower or bathe. However, make sure that the wound is not soaked in water until the sutures have been removed.  · Clean the wound one time each day or as directed by your health care provider.  ? Wash the wound with soap and water.  ? Rinse the wound with water to remove all soap.  ? Pat the wound dry with a clean towel. Do not rub the wound.  · After cleaning the wound, apply a thin layer of antibiotic ointment as directed by your health care provider. This will help to prevent infection and keep the dressing from sticking to the wound.  · Have the sutures removed as directed by your health care provider.  General Instructions  · Take or apply medicines only as directed by your health care provider.  · To help prevent scarring, make sure to cover your wound with sunscreen whenever you are outside after the sutures are removed and the wound is healed. Make sure to wear a sunscreen of at least 30 SPF.  · If you were prescribed an antibiotic medicine or ointment, finish all of it even if you start to feel better.  · Do not scratch or pick at the wound.  · Keep all follow-up visits as directed by your health care provider. This is important.  · Check your wound every day for signs of infection. Watch for:  ? Redness, swelling, or pain.  ? Fluid, blood, or pus.  · Raise (elevate) the injured area above the level of your heart while you are sitting or  lying down, if possible.  · Avoid stretching your wound.  · Drink enough fluids to keep your urine clear or pale yellow.  Contact a health care provider if:  · You received a tetanus shot and you have swelling, severe pain, redness, or bleeding at the injection site.  · You have a fever.  · A wound that was closed breaks open.  · You notice a bad smell coming from the wound.  · You notice something coming out of the wound, such as wood or glass.  · Your pain is not controlled with medicine.  · You have increased redness, swelling, or pain at the site of your wound.  · You have fluid, blood, or pus coming from your wound.  · You notice a change in the color of your skin near your wound.  · You need to change the dressing frequently due to fluid, blood, or pus draining from the wound.  · You develop a new rash.  · You develop numbness around the wound.  Get help right away if:  · You develop severe swelling around the injury site.  · Your pain suddenly increases and is severe.  · You develop painful lumps near the wound or on skin that is anywhere on your body.  ·   You have a red streak going away from your wound.  · The wound is on your hand or foot and you cannot properly move a finger or toe.  · The wound is on your hand or foot and you notice that your fingers or toes look pale or bluish.  This information is not intended to replace advice given to you by your health care provider. Make sure you discuss any questions you have with your health care provider.  Document Released: 09/27/2004 Document Revised: 01/26/2016 Document Reviewed: 04/01/2013  Elsevier Interactive Patient Education © 2017 [employer] Inc[employer]      ==============================

## 2016-10-19 ENCOUNTER — Encounter: Payer: Self-pay | Admitting: Pediatrics

## 2016-10-19 ENCOUNTER — Ambulatory Visit (INDEPENDENT_AMBULATORY_CARE_PROVIDER_SITE_OTHER): Payer: BLUE CROSS/BLUE SHIELD | Admitting: Pediatrics

## 2016-10-19 VITALS — BP 113/76 | HR 84 | Temp 98.5°F | Ht 60.0 in | Wt 158.2 lb

## 2016-10-19 DIAGNOSIS — M5442 Lumbago with sciatica, left side: Secondary | ICD-10-CM | POA: Diagnosis not present

## 2016-10-19 DIAGNOSIS — R29898 Other symptoms and signs involving the musculoskeletal system: Secondary | ICD-10-CM

## 2016-10-19 DIAGNOSIS — G43809 Other migraine, not intractable, without status migrainosus: Secondary | ICD-10-CM | POA: Diagnosis not present

## 2016-10-19 MED ORDER — CYCLOBENZAPRINE HCL 5 MG PO TABS: 5.0000 mg | ORAL_TABLET | Freq: Three times a day (TID) | ORAL | 1 refills | Status: DC | PRN

## 2016-10-19 MED ORDER — IBUPROFEN 600 MG PO TABS: 600.0000 mg | ORAL_TABLET | Freq: Three times a day (TID) | ORAL | 2 refills | Status: DC | PRN

## 2016-10-19 MED ORDER — SUMATRIPTAN SUCCINATE 100 MG PO TABS: 100.0000 mg | ORAL_TABLET | ORAL | 0 refills | Status: DC | PRN

## 2016-10-19 MED ORDER — TOPIRAMATE 25 MG PO TABS: ORAL_TABLET | ORAL | 1 refills | Status: DC

## 2016-10-19 NOTE — Progress Notes (Signed)
  Subjective:   Patient ID: Jamie Readyshley J Blaize, female    DOB: Jan 10, 1991, 26 y.o.   MRN: [id number] CC: Temporomandibular Joint Pain  HPI: Jamie Waters is a 26 y.o. 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 Allergies and medications reviewed and updated. History  Smoking Status  . Never Smoker  Smokeless Tobacco  . Never Used   ROS: Per HPI   Objective:    BP 113/76   Pulse 84   Temp 98.5 F (36.9 C) (Oral)   Ht 5' (1.524 m)   Wt 158 lb 3.2 oz (71.8 kg)   BMI 30.90 kg/m   Wt Readings from Last 3 Encounters:  10/19/16 158 lb 3.2 oz (71.8 kg)  10/14/16 161 lb (73 kg)  09/05/16 162 lb 6.4 oz (73.7 kg)    Gen: NAD, alert, cooperative with exam, NCAT EYES: EOMI, no conjunctival injection, or no icterus ENT:  TMs pearly gray b/l, OP without erythema, opens and closes mouth without crepitus at jaw, no pops, clicks  LYMPH: no cervical LAD CV: NRRR, normal S1/S2, no murmur, distal pulses 2+ b/l Resp: CTABL, no wheezes, normal WOB Abd: +BS, soft, NTND.  Ext: No edema, warm Neuro: Alert and oriented, strength equal b/l UE and LE, face symmetric, coordination grossly normal MSK: no point tenderness over spine  Assessment & Plan:  Morrie Sheldonshley was seen today for temporomandibular joint pain.  Diagnoses and all orders for this visit:  Acute bilateral low back pain with left-sided sciatica Takes primarily at night, helps  with back pain, allows her to sleep -     cyclobenzaprine (FLEXERIL) 5 MG tablet; Take 1 tablet (5 mg total) by mouth 3 (three) times daily as needed for muscle spasms.  Other migraine without status migrainosus, not intractable Restart preventive med, triptan prn -     SUMAtriptan (IMITREX) 100 MG tablet; Take 1 tablet (100 mg total) by mouth every 2 (two) hours as needed for migraine. May repeat in 2 hours if headache persists or recurs. -     topiramate (TOPAMAX) 25 MG tablet; Take 1 tab BID for 5 days, then 2 tabs BID for 5 days  TMJ click Only in the morning Discussed bite guards, NSAIDs If not improving, should discuss with dentist next visit -     ibuprofen (ADVIL,MOTRIN) 600 MG tablet; Take 1 tablet (600 mg total) by mouth every 8 (eight) hours as needed.   Follow up plan: Return in about 2 months (around 12/17/2016). Rex Krasarol Jaidan Prevette, MD [facility]

## 2016-10-20 ENCOUNTER — Encounter: Payer: Self-pay | Admitting: Pediatrics

## 2016-10-29 ENCOUNTER — Telehealth: Payer: Self-pay | Admitting: Physician Assistant

## 2016-10-30 NOTE — Telephone Encounter (Signed)
lmtcb jkp 2/27 

## 2016-10-31 NOTE — Telephone Encounter (Signed)
Pt aware.

## 2016-10-31 NOTE — Telephone Encounter (Signed)
Needs to be seen

## 2016-11-05 ENCOUNTER — Ambulatory Visit (INDEPENDENT_AMBULATORY_CARE_PROVIDER_SITE_OTHER): Payer: BLUE CROSS/BLUE SHIELD | Admitting: Pediatrics

## 2016-11-05 ENCOUNTER — Encounter: Payer: Self-pay | Admitting: Pediatrics

## 2016-11-05 VITALS — BP 117/80 | HR 94 | Temp 97.6°F | Ht 60.0 in | Wt 161.2 lb

## 2016-11-05 DIAGNOSIS — F411 Generalized anxiety disorder: Secondary | ICD-10-CM | POA: Diagnosis not present

## 2016-11-05 DIAGNOSIS — G8929 Other chronic pain: Secondary | ICD-10-CM | POA: Diagnosis not present

## 2016-11-05 DIAGNOSIS — M545 Low back pain, unspecified: Secondary | ICD-10-CM | POA: Insufficient documentation

## 2016-11-05 DIAGNOSIS — G43909 Migraine, unspecified, not intractable, without status migrainosus: Secondary | ICD-10-CM | POA: Insufficient documentation

## 2016-11-05 DIAGNOSIS — R29898 Other symptoms and signs involving the musculoskeletal system: Secondary | ICD-10-CM | POA: Diagnosis not present

## 2016-11-05 DIAGNOSIS — M797 Fibromyalgia: Secondary | ICD-10-CM | POA: Diagnosis not present

## 2016-11-05 DIAGNOSIS — M359 Systemic involvement of connective tissue, unspecified: Secondary | ICD-10-CM | POA: Diagnosis not present

## 2016-11-05 DIAGNOSIS — N96 Recurrent pregnancy loss: Secondary | ICD-10-CM | POA: Insufficient documentation

## 2016-11-05 MED ORDER — ESCITALOPRAM OXALATE 10 MG PO TABS: 10.0000 mg | ORAL_TABLET | Freq: Every day | ORAL | 2 refills | Status: DC

## 2016-11-05 MED ORDER — CYCLOBENZAPRINE HCL 10 MG PO TABS: 10.0000 mg | ORAL_TABLET | Freq: Three times a day (TID) | ORAL | 1 refills | Status: DC | PRN

## 2016-11-05 NOTE — Progress Notes (Signed)
  Subjective:   Patient ID: Jamie Waters, female    DOB: 1991-06-01, 26 y.o.   MRN: [id number] CC: Medication Dose Change (TMJ)  HPI: Jamie Readyshley J Vorhees is a 26 y.o. female presenting for Medication Dose Change (TMJ)  Has pain all day TMJ pain comes and goes Has pain all over Feels stressed constantly Jaw popped loudly 2-3 months ago, has had pain since then Has been on xanax in the past for anxiety Just recently started topiramate for headaches, had to coax herself into it Feels like mind racing When on cymbalta had urinary retention prozac 10 yrs ago, not sure how long she has been on it Taking cyclobenzaprine for pain in her shoulders, knees, lower back Sleeping is hard at night because of anxiety Has been diagnosed with fibromyalgia in the past   Relevant past medical, surgical, family and social history reviewed. Allergies and medications reviewed and updated. History  Smoking Status  . Never Smoker  Smokeless Tobacco  . Never Used   ROS: Per HPI   Objective:    BP 117/80   Pulse 94   Temp 97.6 F (36.4 C) (Oral)   Ht 5' (1.524 m)   Wt 161 lb 3.2 oz (73.1 kg)   BMI 31.48 kg/m   Wt Readings from Last 3 Encounters:  11/05/16 161 lb 3.2 oz (73.1 kg)  10/19/16 158 lb 3.2 oz (71.8 kg)  10/14/16 161 lb (73 kg)    Gen: NAD, alert, cooperative with exam, NCAT EYES: EOMI, no conjunctival injection, or no icterus CV: NRRR, normal S1/S2, no murmur, distal pulses 2+ b/l Resp: CTABL, no wheezes, normal WOB Ext: No edema, warm Neuro: Alert and oriented, strength equal b/l UE and LE, coordination grossly normal MSK: no point tenderness over spine, ttp paraspinal muscles b/l  Assessment & Plan:  Morrie Sheldonshley was seen today for medication dose change.  Diagnoses and all orders for this visit:  Fibromyalgia Ongoing symptoms Increase flexeril as below Walk daily  Generalized anxiety disorder Discussed stress relief, start below Feels safe at home, no thoughts of self  harm F/u in 2 mo -     escitalopram (LEXAPRO) 10 MG tablet; Take 1 tablet (10 mg total) by mouth daily.  Chronic bilateral low back pain without sciatica Below helps relieve some of the pain, usually takes twice a day, does not make her overly tired -     cyclobenzaprine (FLEXERIL) 10 MG tablet; Take 1 tablet (10 mg total) by mouth 3 (three) times daily as needed for muscle spasms.  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 Rex Krasarol Addysen Louth, MD [facility]

## 2016-12-14 ENCOUNTER — Ambulatory Visit: Payer: BLUE CROSS/BLUE SHIELD | Admitting: Pediatrics

## 2016-12-19 ENCOUNTER — Encounter: Payer: Self-pay | Admitting: Family

## 2016-12-19 ENCOUNTER — Ambulatory Visit (INDEPENDENT_AMBULATORY_CARE_PROVIDER_SITE_OTHER): Payer: BLUE CROSS/BLUE SHIELD | Admitting: Family

## 2016-12-19 VITALS — BP 119/81 | HR 121 | Temp 98.5°F | Ht 60.0 in | Wt 153.8 lb

## 2016-12-19 DIAGNOSIS — B9689 Other specified bacterial agents as the cause of diseases classified elsewhere: Secondary | ICD-10-CM | POA: Diagnosis not present

## 2016-12-19 DIAGNOSIS — N760 Acute vaginitis: Secondary | ICD-10-CM | POA: Diagnosis not present

## 2016-12-19 DIAGNOSIS — N898 Other specified noninflammatory disorders of vagina: Secondary | ICD-10-CM

## 2016-12-19 DIAGNOSIS — L298 Other pruritus: Secondary | ICD-10-CM | POA: Diagnosis not present

## 2016-12-19 LAB — WET PREP FOR TRICH, YEAST, CLUE
Clue Cell Exam: NEGATIVE
Trichomonas Exam: NEGATIVE
Yeast Exam: NEGATIVE

## 2016-12-19 MED ORDER — METRONIDAZOLE 500 MG PO TABS: 500.0000 mg | ORAL_TABLET | Freq: Two times a day (BID) | ORAL | 0 refills | Status: DC

## 2016-12-19 NOTE — Progress Notes (Signed)
   Subjective:    Patient ID: Jamie Waters, female    DOB: 10-30-1990, 26 y.o.   MRN: [id number]  Vaginal Pain  The patient's primary symptoms include genital itching and vaginal discharge. This is a new problem. The current episode started 1 to 4 weeks ago. The problem occurs intermittently. The problem has been unchanged. The pain is moderate. Associated symptoms include urgency. Pertinent negatives include no discolored urine, dysuria, frequency, hematuria or painful intercourse. The vaginal discharge was milky and green. She has tried nothing for the symptoms. The treatment provided moderate relief. She is sexually active.      Review of Systems  Genitourinary: Positive for urgency, vaginal discharge and vaginal pain. Negative for dysuria, frequency and hematuria.  All other systems reviewed and are negative.      Objective:   Physical Exam  Constitutional: She is oriented to person, place, and time. She appears well-developed and well-nourished. No distress.  HENT:  Head: Normocephalic and atraumatic.  Right Ear: External ear normal.  Left Ear: External ear normal.  Nose: Nose normal.  Mouth/Throat: Oropharynx is clear and moist.  Eyes: Pupils are equal, round, and reactive to light.  Neck: Normal range of motion. Neck supple. No thyromegaly present.  Cardiovascular: Normal rate, regular rhythm, normal heart sounds and intact distal pulses.   No murmur heard. Pulmonary/Chest: Effort normal and breath sounds normal. No respiratory distress. She has no wheezes.  Abdominal: Soft. Bowel sounds are normal. She exhibits no distension. There is no tenderness.  Musculoskeletal: Normal range of motion. She exhibits no edema or tenderness.  Neurological: She is alert and oriented to person, place, and time.  Skin: Skin is warm and dry.  Psychiatric: She has a normal mood and affect. Her behavior is normal. Judgment and thought content normal.  Vitals reviewed.     BP 119/81    Pulse (!) 121   Temp 98.5 F (36.9 C) (Oral)   Ht 5' (1.524 m)   Wt 153 lb 12.8 oz (69.8 kg)   LMP 11/27/2016   BMI 30.04 kg/m      Assessment & Plan:  1. Vaginal itching - WET PREP FOR TRICH, YEAST, CLUE  2. BV (bacterial vaginosis) -Keep clean an dry -Start probiotic -RTO prn  - metroNIDAZOLE (FLAGYL) 500 MG tablet; Take 1 tablet (500 mg total) by mouth 2 (two) times daily.  Dispense: 14 tablet; Refill: 0   Jannifer Rodney, FNP

## 2016-12-19 NOTE — Patient Instructions (Signed)
Bacterial Vaginosis Bacterial vaginosis is a vaginal infection that occurs when the normal balance of bacteria in the vagina is disrupted. It results from an overgrowth of certain bacteria. This is the most common vaginal infection among women ages 15-44. Because bacterial vaginosis increases your risk for STIs (sexually transmitted infections), getting treated can help reduce your risk for chlamydia, gonorrhea, herpes, and HIV (human immunodeficiency virus). Treatment is also important for preventing complications in pregnant women, because this condition can cause an early (premature) delivery. What are the causes? This condition is caused by an increase in harmful bacteria that are normally present in small amounts in the vagina. However, the reason that the condition develops is not fully understood. What increases the risk? The following factors may make you more likely to develop this condition:  Having a new sexual partner or multiple sexual partners.  Having unprotected sex.  Douching.  Having an intrauterine device (IUD).  Smoking.  Drug and alcohol abuse.  Taking certain antibiotic medicines.  Being pregnant.  You cannot get bacterial vaginosis from toilet seats, bedding, swimming pools, or contact with objects around you. What are the signs or symptoms? Symptoms of this condition include:  Grey or white vaginal discharge. The discharge can also be watery or foamy.  A fish-like odor with discharge, especially after sexual intercourse or during menstruation.  Itching in and around the vagina.  Burning or pain with urination.  Some women with bacterial vaginosis have no signs or symptoms. How is this diagnosed? This condition is diagnosed based on:  Your medical history.  A physical exam of the vagina.  Testing a sample of vaginal fluid under a microscope to look for a large amount of bad bacteria or abnormal cells. Your health care provider may use a cotton swab  or a small wooden spatula to collect the sample.  How is this treated? This condition is treated with antibiotics. These may be given as a pill, a vaginal cream, or a medicine that is put into the vagina (suppository). If the condition comes back after treatment, a second round of antibiotics may be needed. Follow these instructions at home: Medicines  Take over-the-counter and prescription medicines only as told by your health care provider.  Take or use your antibiotic as told by your health care provider. Do not stop taking or using the antibiotic even if you start to feel better. General instructions  If you have a female sexual partner, tell her that you have a vaginal infection. She should see her health care provider and be treated if she has symptoms. If you have a female sexual partner, he does not need treatment.  During treatment: ? Avoid sexual activity until you finish treatment. ? Do not douche. ? Avoid alcohol as directed by your health care provider. ? Avoid breastfeeding as directed by your health care provider.  Drink enough water and fluids to keep your urine clear or pale yellow.  Keep the area around your vagina and rectum clean. ? Wash the area daily with warm water. ? Wipe yourself from front to back after using the toilet.  Keep all follow-up visits as told by your health care provider. This is important. How is this prevented?  Do not douche.  Wash the outside of your vagina with warm water only.  Use protection when having sex. This includes latex condoms and dental dams.  Limit how many sexual partners you have. To help prevent bacterial vaginosis, it is best to have sex with just   one partner (monogamous).  Make sure you and your sexual partner are tested for STIs.  Wear cotton or cotton-lined underwear.  Avoid wearing tight pants and pantyhose, especially during summer.  Limit the amount of alcohol that you drink.  Do not use any products that  contain nicotine or tobacco, such as cigarettes and e-cigarettes. If you need help quitting, ask your health care provider.  Do not use illegal drugs. Where to find more information:  Centers for Disease Control and Prevention: www.cdc.gov/std  American Sexual Health Association (ASHA): www.ashastd.org  U.S. Department of Health and Human Services, Office on Women's Health: www.womenshealth.gov/ or https://www.womenshealth.gov/a-z-topics/bacterial-vaginosis Contact a health care provider if:  Your symptoms do not improve, even after treatment.  You have more discharge or pain when urinating.  You have a fever.  You have pain in your abdomen.  You have pain during sex.  You have vaginal bleeding between periods. Summary  Bacterial vaginosis is a vaginal infection that occurs when the normal balance of bacteria in the vagina is disrupted.  Because bacterial vaginosis increases your risk for STIs (sexually transmitted infections), getting treated can help reduce your risk for chlamydia, gonorrhea, herpes, and HIV (human immunodeficiency virus). Treatment is also important for preventing complications in pregnant women, because the condition can cause an early (premature) delivery.  This condition is treated with antibiotic medicines. These may be given as a pill, a vaginal cream, or a medicine that is put into the vagina (suppository). This information is not intended to replace advice given to you by your health care provider. Make sure you discuss any questions you have with your health care provider. Document Released: 08/20/2005 Document Revised: 05/05/2016 Document Reviewed: 05/05/2016 Elsevier Interactive Patient Education  2017 Elsevier Inc.  

## 2016-12-20 DIAGNOSIS — Z319 Encounter for procreative management, unspecified: Secondary | ICD-10-CM | POA: Diagnosis not present

## 2017-01-17 ENCOUNTER — Encounter: Payer: Self-pay | Admitting: Pediatrics

## 2017-01-17 ENCOUNTER — Ambulatory Visit (INDEPENDENT_AMBULATORY_CARE_PROVIDER_SITE_OTHER): Payer: BLUE CROSS/BLUE SHIELD | Admitting: Pediatrics

## 2017-01-17 VITALS — BP 110/74 | HR 90 | Temp 98.6°F | Ht 60.0 in | Wt 156.4 lb

## 2017-01-17 DIAGNOSIS — M359 Systemic involvement of connective tissue, unspecified: Secondary | ICD-10-CM

## 2017-01-17 DIAGNOSIS — M797 Fibromyalgia: Secondary | ICD-10-CM | POA: Diagnosis not present

## 2017-01-17 DIAGNOSIS — G8929 Other chronic pain: Secondary | ICD-10-CM

## 2017-01-17 DIAGNOSIS — F411 Generalized anxiety disorder: Secondary | ICD-10-CM | POA: Diagnosis not present

## 2017-01-17 DIAGNOSIS — R7303 Prediabetes: Secondary | ICD-10-CM | POA: Diagnosis not present

## 2017-01-17 DIAGNOSIS — M545 Low back pain: Secondary | ICD-10-CM | POA: Diagnosis not present

## 2017-01-17 LAB — BAYER DCA HB A1C WAIVED: HB A1C (BAYER DCA - WAIVED): 5.3 % (ref <7.0)

## 2017-01-17 MED ORDER — CYCLOBENZAPRINE HCL 10 MG PO TABS: 10.0000 mg | ORAL_TABLET | Freq: Three times a day (TID) | ORAL | 3 refills | Status: DC | PRN

## 2017-01-17 MED ORDER — BUSPIRONE HCL 5 MG PO TABS: ORAL_TABLET | ORAL | 2 refills | Status: DC
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
## 2017-01-25 ENCOUNTER — Telehealth: Payer: Self-pay | Admitting: *Deleted

## 2017-01-25 NOTE — Telephone Encounter (Signed)
Left message for patient to call back.  [facility] is not taking new patient's at this time.  Is there anywhere else patient would like to go?  Per Dr. Oswaldo DoneVincent

## 2017-01-29 ENCOUNTER — Other Ambulatory Visit: Payer: Self-pay | Admitting: Obstetrics and Gynecology

## 2017-01-29 DIAGNOSIS — N771 Vaginitis, vulvitis and vulvovaginitis in diseases classified elsewhere: Secondary | ICD-10-CM | POA: Diagnosis not present

## 2017-01-29 DIAGNOSIS — Z113 Encounter for screening for infections with a predominantly sexual mode of transmission: Secondary | ICD-10-CM | POA: Diagnosis not present

## 2017-01-29 DIAGNOSIS — N760 Acute vaginitis: Secondary | ICD-10-CM | POA: Diagnosis not present

## 2017-01-29 DIAGNOSIS — Z6830 Body mass index (BMI) 30.0-30.9, adult: Secondary | ICD-10-CM | POA: Diagnosis not present

## 2017-02-01 NOTE — Addendum Note (Signed)
Addendum  created 02/01/17 0844 by Shelton SilvasHollis, Woodley Petzold D, MD   Sign clinical note

## 2017-04-22 ENCOUNTER — Ambulatory Visit (INDEPENDENT_AMBULATORY_CARE_PROVIDER_SITE_OTHER): Payer: BLUE CROSS/BLUE SHIELD | Admitting: Pediatrics

## 2017-04-22 ENCOUNTER — Encounter: Payer: Self-pay | Admitting: Pediatrics

## 2017-04-22 DIAGNOSIS — G8929 Other chronic pain: Secondary | ICD-10-CM

## 2017-04-22 DIAGNOSIS — G43809 Other migraine, not intractable, without status migrainosus: Secondary | ICD-10-CM

## 2017-04-22 DIAGNOSIS — R29898 Other symptoms and signs involving the musculoskeletal system: Secondary | ICD-10-CM

## 2017-04-22 DIAGNOSIS — M545 Low back pain: Secondary | ICD-10-CM

## 2017-04-22 DIAGNOSIS — F411 Generalized anxiety disorder: Secondary | ICD-10-CM

## 2017-04-22 MED ORDER — CYCLOBENZAPRINE HCL 10 MG PO TABS: 10.0000 mg | ORAL_TABLET | Freq: Three times a day (TID) | ORAL | 3 refills | Status: DC | PRN

## 2017-04-22 MED ORDER — IBUPROFEN 600 MG PO TABS: 600.0000 mg | ORAL_TABLET | Freq: Three times a day (TID) | ORAL | 2 refills | Status: DC | PRN

## 2017-04-22 MED ORDER — VILAZODONE HCL 40 MG PO TABS: 40.0000 mg | ORAL_TABLET | Freq: Every day | ORAL | 2 refills | Status: DC

## 2017-04-22 NOTE — Progress Notes (Signed)
  Subjective:   Patient ID: Jamie Waters, female    DOB: Nov 14, 1990, 26 y.o.   MRN: [id number] CC: Follow-up (3 month) multiple med problems  HPI: Jamie Waters is a 26 y.o. female presenting for Follow-up (3 month)  Here today with her husband  Has been feeling like she can't catch her breath at times Happens randomly, not with exercise Has been walking outside regularly in the heat, no worsenin gof symptoms Has had worsening anxiety symptoms No wheezing  Anxiety: has tried lexapro in past, didn't like the sexual dysfunction buspar hasnt been helping, can only remember to take it at night cymbalta caused SOB Wellbutrin she doesn't think helped much Prozac was tried when she was a teenager, doesn't remember if it helped or not  Hasnt been back to counseling due to cost, even with insruance $60 Mood has been ok Sometimes has hard time sleeping, stays awake worrying Has tried melatonin  Fibromyalgia: not any worse, taking flexeril regularly  Relevant past medical, surgical, family and social history reviewed. Allergies and medications reviewed and updated. History  Smoking Status  . Never Smoker  Smokeless Tobacco  . Never Used   ROS: Per HPI   Objective:    BP 103/75   Pulse 92   Temp 99.1 F (37.3 C) (Oral)   Ht 5' (1.524 m)   Wt 151 lb 12.8 oz (68.9 kg)   BMI 29.65 kg/m   Wt Readings from Last 3 Encounters:  04/22/17 151 lb 12.8 oz (68.9 kg)  01/17/17 156 lb 6.4 oz (70.9 kg)  12/19/16 153 lb 12.8 oz (69.8 kg)    Gen: NAD, alert, cooperative with exam, NCAT EYES: EOMI, no conjunctival injection, or no icterus ENT:  TMs pearly gray b/l, OP without erythema LYMPH: no cervical LAD CV: NRRR, normal S1/S2, no murmur, distal pulses 2+ b/l Resp: CTABL, no wheezes, normal WOB Ext: No edema, warm Neuro: Alert and oriented, strength equal b/l UE and LE, coordination grossly normal MSK: normal muscle bulk  Assessment & Plan:  Jamie Waters was seen today for  follow-up.  Diagnoses and all orders for this visit:  Generalized anxiety disorder Ongoing symptoms Stop buspar, doesn't think helping, only increased up to 5mg  qhs bc not able to remember to take it more often Start viibryd 10mg  for 1 week, then 20mg  for 2 weeks, then 40mg  Samples given, card given may need prior auth Has been tried on multiple other medications  Cont to look into counseling options -     Vilazodone HCl (VIIBRYD) 40 MG TABS; Take 1 tablet (40 mg total) by mouth daily.  Chronic bilateral low back pain without sciatica Related to fibromyalgia, stable, cont below prn -     cyclobenzaprine (FLEXERIL) 10 MG tablet; Take 1 tablet (10 mg total) by mouth 3 (three) times daily as needed for muscle spasms.  TMJ click Take below as needed -     ibuprofen (ADVIL,MOTRIN) 600 MG tablet; Take 1 tablet (600 mg total) by mouth every 8 (eight) hours as needed.  Other migraine without status migrainosus, not intractable Stable, not needing sumatriptan recently  Follow up plan: Return in about 3 months (around 07/23/2017). Rex Kras, MD [facility]

## 2017-05-02 ENCOUNTER — Ambulatory Visit (INDEPENDENT_AMBULATORY_CARE_PROVIDER_SITE_OTHER): Payer: BLUE CROSS/BLUE SHIELD | Admitting: Pediatrics

## 2017-05-02 ENCOUNTER — Encounter: Payer: Self-pay | Admitting: Pediatrics

## 2017-05-02 VITALS — BP 105/68 | HR 99 | Temp 99.2°F | Ht 60.0 in | Wt 151.6 lb

## 2017-05-02 DIAGNOSIS — R399 Unspecified symptoms and signs involving the genitourinary system: Secondary | ICD-10-CM | POA: Diagnosis not present

## 2017-05-02 DIAGNOSIS — N3090 Cystitis, unspecified without hematuria: Secondary | ICD-10-CM | POA: Diagnosis not present

## 2017-05-02 DIAGNOSIS — F419 Anxiety disorder, unspecified: Secondary | ICD-10-CM | POA: Diagnosis not present

## 2017-05-02 DIAGNOSIS — F4320 Adjustment disorder, unspecified: Secondary | ICD-10-CM

## 2017-05-02 DIAGNOSIS — F4321 Adjustment disorder with depressed mood: Secondary | ICD-10-CM

## 2017-05-02 LAB — URINALYSIS, COMPLETE
Bilirubin, UA: NEGATIVE
Glucose, UA: NEGATIVE
Nitrite, UA: POSITIVE — AB
Specific Gravity, UA: 1.025 (ref 1.005–1.030)
Urobilinogen, Ur: 0.2 mg/dL (ref 0.2–1.0)
pH, UA: 6 (ref 5.0–7.5)

## 2017-05-02 LAB — MICROSCOPIC EXAMINATION
Epithelial Cells (non renal): >10 /hpf — AB (ref 0–10)
RBC, UA: >30 /hpf — AB (ref 0–?)
Renal Epithel, UA: NONE SEEN /hpf
WBC, UA: >30 /hpf — AB (ref 0–?)

## 2017-05-02 MED ORDER — NITROFURANTOIN MONOHYD MACRO 100 MG PO CAPS: 100.0000 mg | ORAL_CAPSULE | Freq: Two times a day (BID) | ORAL | 0 refills | Status: AC

## 2017-05-02 MED ORDER — SERTRALINE HCL 50 MG PO TABS: 50.0000 mg | ORAL_TABLET | Freq: Every day | ORAL | 3 refills | Status: DC

## 2017-05-02 MED ORDER — BUSPIRONE HCL 7.5 MG PO TABS
7.5000 mg | ORAL_TABLET | Freq: Two times a day (BID) | ORAL | 2 refills | Status: DC
Start: 2017-05-02 — End: 2017-09-11

## 2017-05-02 NOTE — Progress Notes (Signed)
  Subjective:   Patient ID: Jamie Waters, female    DOB: [birth year], 26 y.o.   MRN: [id number] CC: Burning with urination; Painful urination; and cloudy urine  HPI: Jamie Waters is a 26 y.o. 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 Allergies and medications reviewed and updated. History  Smoking Status  . Never Smoker  Smokeless Tobacco  . Never Used   ROS: Per HPI   Objective:    BP 105/68   Pulse 99   Temp 99.2 F (37.3 C) (Oral)   Ht 5' (1.524 m)   Wt 151 lb 9.6 oz (68.8 kg)   BMI 29.61 kg/m   Wt Readings from Last 3 Encounters:  05/02/17 151 lb 9.6 oz (68.8 kg)  04/22/17 151 lb 12.8 oz (68.9 kg)  01/17/17 156 lb 6.4 oz (70.9 kg)    Gen: NAD, alert, cooperative with exam, NCAT EYES: EOMI, no conjunctival injection, or no icterus ENT:  TMs pearly gray b/l, OP without erythema LYMPH: no cervical LAD CV: NRRR, normal S1/S2, no murmur, distal pulses 2+ b/l Resp: CTABL, no wheezes, normal WOB Abd: +BS, soft, NTND. no guarding or organomegaly, no CVA tenderness Ext: No edema, warm Neuro: Alert and oriented, strength equal b/l UE and LE, coordination grossly normal MSK: normal muscle bulk  Assessment & Plan:  Morrie Sheldonshley was seen today for burning with urination, painful urination and cloudy urine.  Diagnoses and all orders for this visit:  Cystitis +UA, f/u culture Start below -     Urine Culture -     nitrofurantoin, macrocrystal-monohydrate, (MACROBID) 100 MG capsule; Take 1 capsule (100 mg total) by mouth 2 (two) times daily.  UTI symptoms -      Urinalysis, Complete  Anxiety Ongoing symptoms Grieving lost pregnancies, may not be able to have children Gave support group information Encouraged counseling Start below Restart buspar, take twice a day, set phone alarms -     sertraline (ZOLOFT) 50 MG tablet; Take 1 tablet (50 mg total) by mouth daily. Take 1/2 tab first 2 weeks then full tab   Follow up plan: Return in about 2 months (around 07/02/2017). Rex Krasarol Vincent, MD [facility]

## 2017-05-02 NOTE — Patient Instructions (Signed)
Women's hospital comfort program: [phone]

## 2017-05-05 LAB — URINE CULTURE

## 2017-06-17 DIAGNOSIS — Z6828 Body mass index (BMI) 28.0-28.9, adult: Secondary | ICD-10-CM | POA: Diagnosis not present

## 2017-06-17 DIAGNOSIS — Z01419 Encounter for gynecological examination (general) (routine) without abnormal findings: Secondary | ICD-10-CM | POA: Diagnosis not present

## 2017-06-17 DIAGNOSIS — Z124 Encounter for screening for malignant neoplasm of cervix: Secondary | ICD-10-CM | POA: Diagnosis not present

## 2017-06-29 IMAGING — CT CT ABD-PELV W/ CM
2 of 4 series · 16 of 46 positions shown, 18 images · IV contrast (Omnipaque 300)
Comparison: 12/14/2014

CLINICAL DATA: Abdominal pain and nausea.

EXAM:
CT ABDOMEN AND PELVIS WITH CONTRAST
TECHNIQUE: Multidetector CT imaging of the abdomen and pelvis was performed
using the standard protocol following bolus administration of
intravenous contrast.
CONTRAST:  50mL OMNIPAQUE IOHEXOL 300 MG/ML SOLN, 100mL OMNIPAQUE
IOHEXOL 300 MG/ML SOLN

[Series 2: abd_pel_with 5.0 b40f · axial · 0.70mm/px · z∈[+330,+750]mm · 13 of 92 slices shown, 15 images]
[im 4/92  soft-tissue]
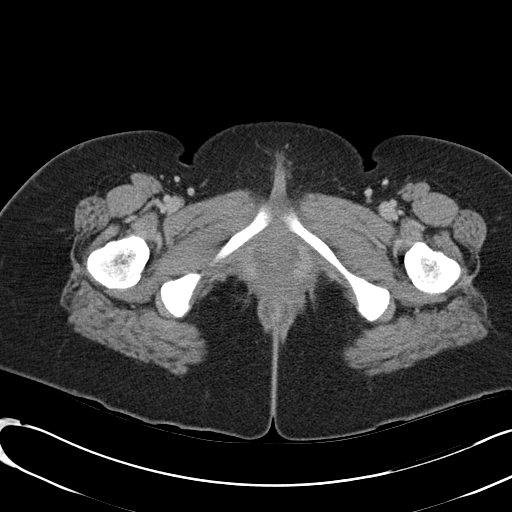
[im 4/92  bone]
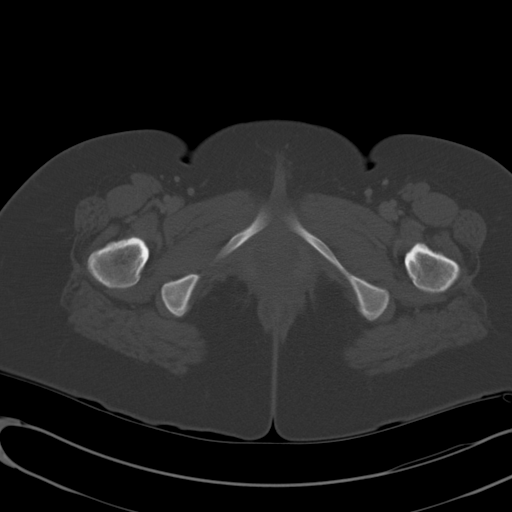
[im 12/92  soft-tissue]
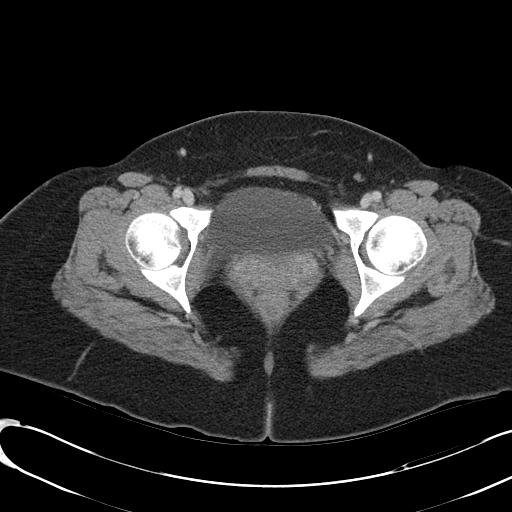
[im 20/92  soft-tissue]
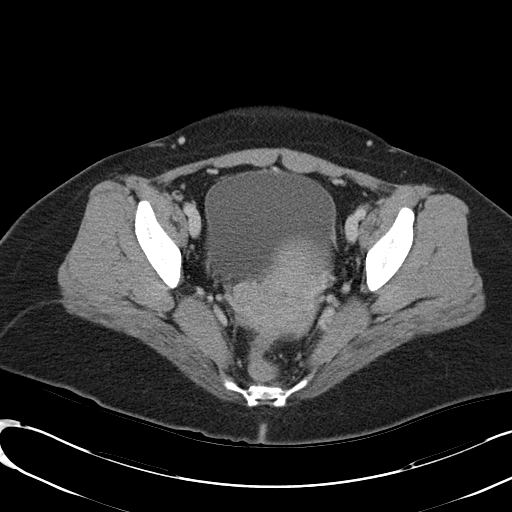
[im 24/92  soft-tissue]
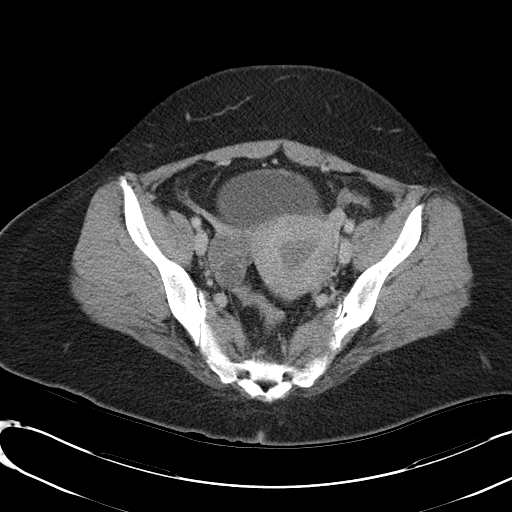
[im 32/92  soft-tissue]
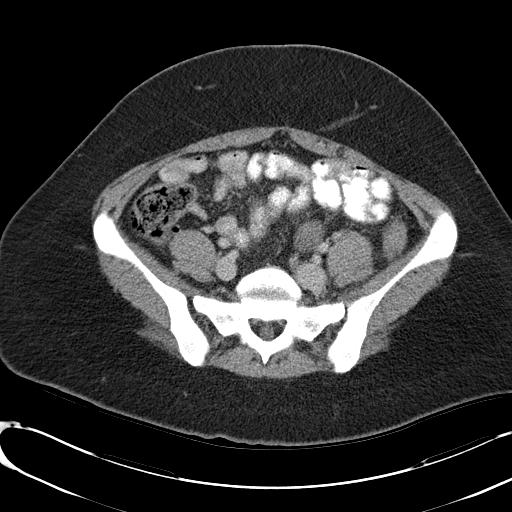
[im 40/92  soft-tissue]
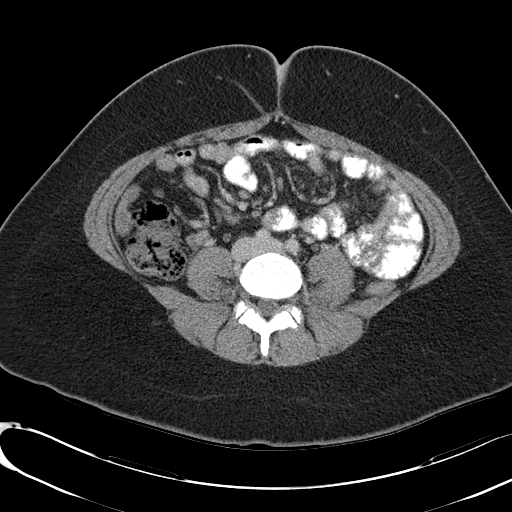
[im 48/92  soft-tissue]
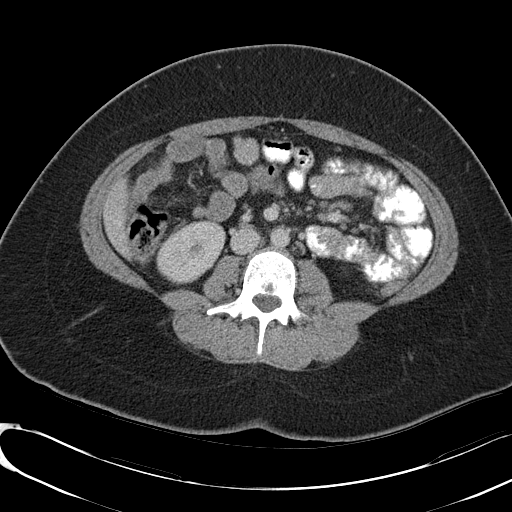
[im 52/92  soft-tissue]
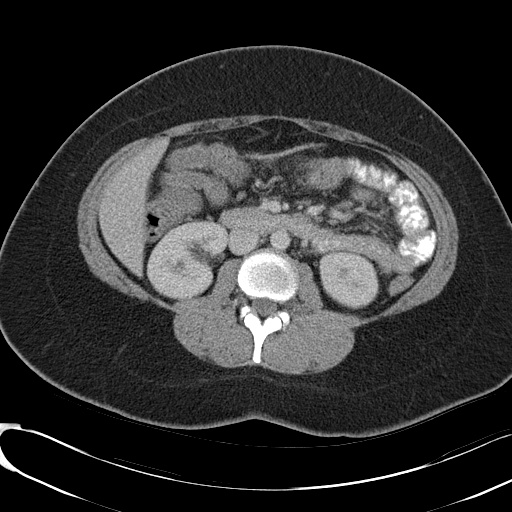
[im 60/92  soft-tissue]
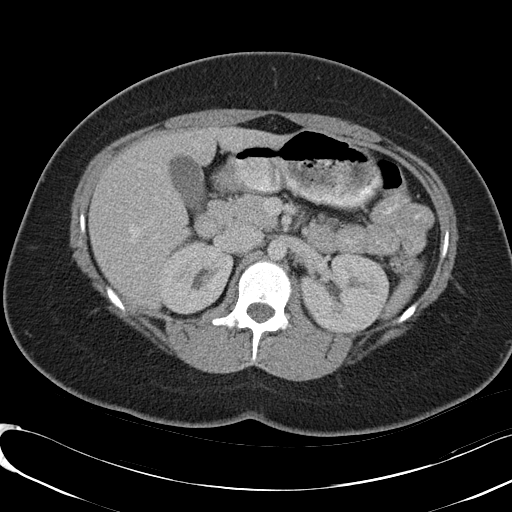
[im 60/92  bone]
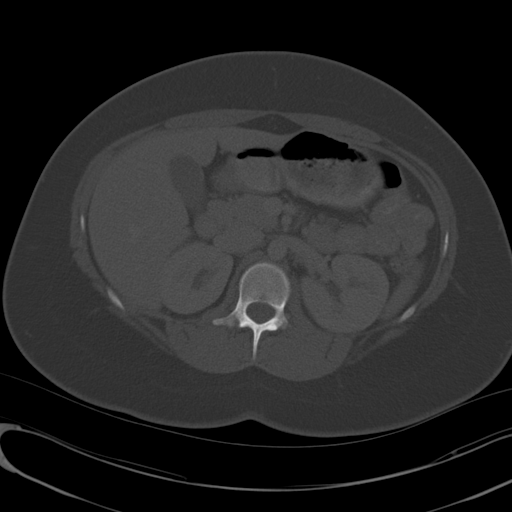
[im 68/92  soft-tissue]
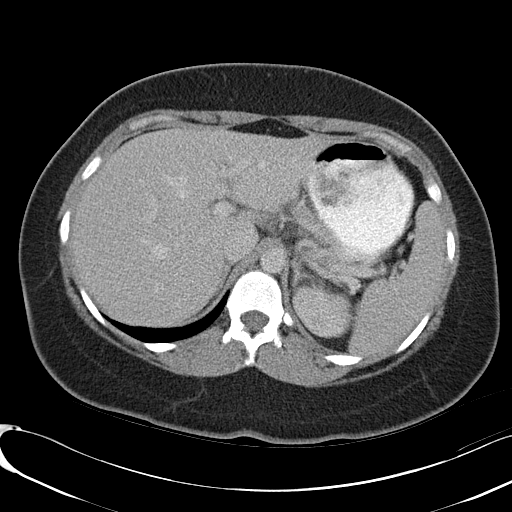
[im 72/92  soft-tissue]
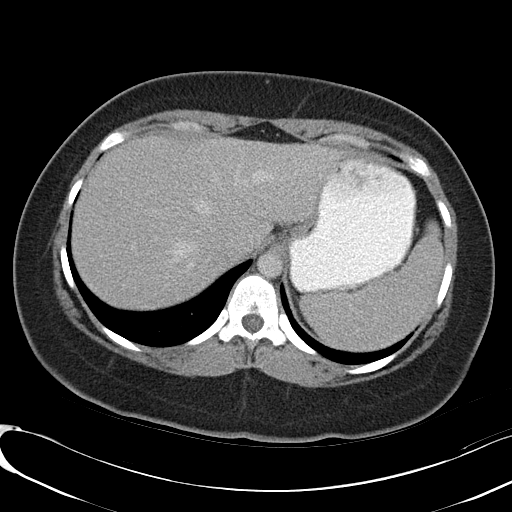
[im 80/92  soft-tissue]
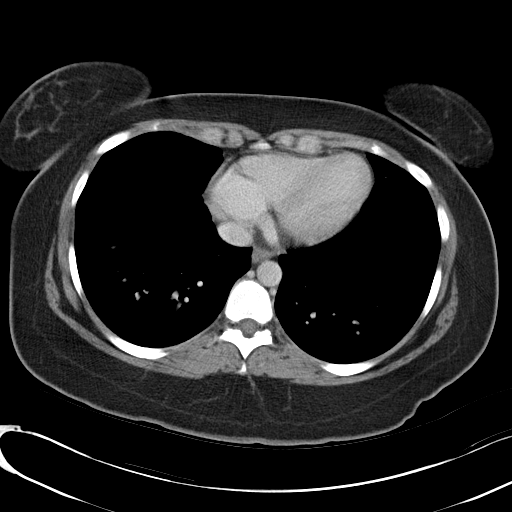
[im 88/92  soft-tissue]
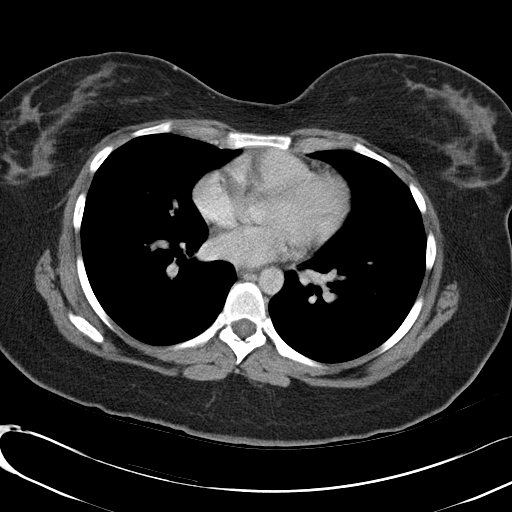

[Series 3: abd_pel_with 3.0 spo cor · coronal · 0.78mm/px · 3 of 101 slices shown]
[im 34/101  soft-tissue]
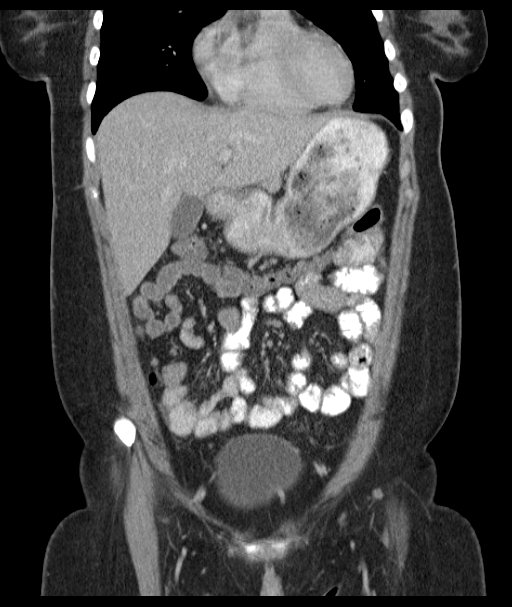
[im 45/101  soft-tissue]
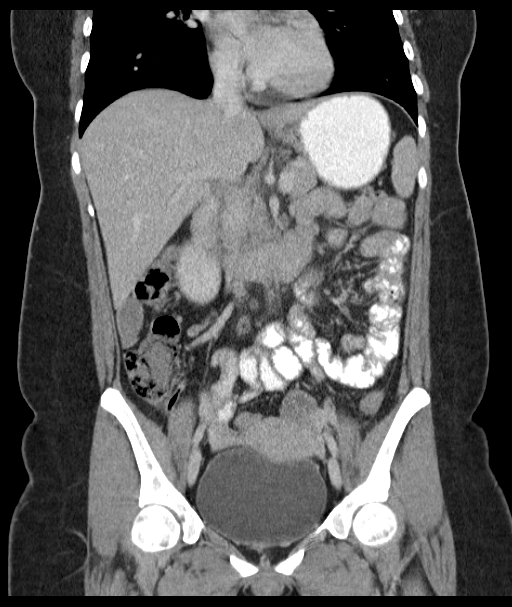
[im 56/101  soft-tissue]
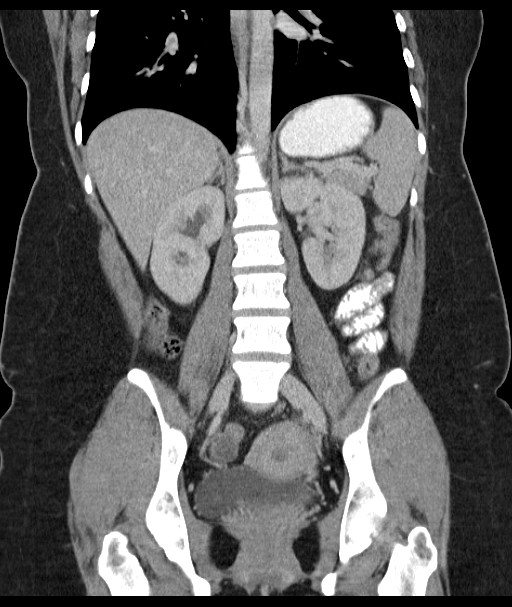

[16 of 46 positions shown; findings below may reference images not displayed]

FINDINGS: Lower chest: No pleural or pericardial effusion. The lung bases
appear clear.

Hepatobiliary: No suspicious liver abnormalities identified. The
gallbladder appears normal. There is no biliary dilatation.

Pancreas: Negative

Spleen: Negative

Adrenals/Urinary Tract: Normal adrenal glands. Bilateral
pelvocaliectasis is identified. No obstructing stone or mass noted.
The urinary bladder appears normal.

Stomach/Bowel: The stomach is normal. The small bowel loops are
normal. The appendix is visualized and appears normal. Unremarkable
appearance of the colon.

Vascular/Lymphatic: Normal appearance of the abdominal aorta. No
enlarged retroperitoneal or mesenteric adenopathy. No enlarged
pelvic or inguinal lymph nodes.

Reproductive: The uterus and the adnexal structures have a normal
appearance.

Other: No free fluid or fluid collections identified within the
abdomen or pelvis.

Musculoskeletal: No acute bone abnormality.
IMPRESSION: 1. No acute findings identified within the abdomen or pelvis.
2. Bilateral pelvocaliectasis without obstructing stone identified.

## 2017-07-03 ENCOUNTER — Ambulatory Visit: Payer: BLUE CROSS/BLUE SHIELD | Admitting: Pediatrics

## 2017-07-24 ENCOUNTER — Ambulatory Visit: Payer: BLUE CROSS/BLUE SHIELD | Admitting: Pediatrics

## 2017-07-30 DIAGNOSIS — K5900 Constipation, unspecified: Secondary | ICD-10-CM | POA: Diagnosis not present

## 2017-09-11 ENCOUNTER — Encounter: Payer: Self-pay | Admitting: Pediatrics

## 2017-09-11 ENCOUNTER — Ambulatory Visit (INDEPENDENT_AMBULATORY_CARE_PROVIDER_SITE_OTHER): Payer: BLUE CROSS/BLUE SHIELD | Admitting: Pediatrics

## 2017-09-11 VITALS — BP 107/73 | HR 80 | Temp 97.7°F | Ht 60.0 in | Wt 131.0 lb

## 2017-09-11 DIAGNOSIS — N644 Mastodynia: Secondary | ICD-10-CM | POA: Diagnosis not present

## 2017-09-11 DIAGNOSIS — M359 Systemic involvement of connective tissue, unspecified: Secondary | ICD-10-CM | POA: Diagnosis not present

## 2017-09-11 DIAGNOSIS — F411 Generalized anxiety disorder: Secondary | ICD-10-CM | POA: Diagnosis not present

## 2017-09-11 NOTE — Progress Notes (Signed)
  Subjective:   Patient ID: Jamie Waters, female    DOB: [birth year], 27 y.o.   MRN: [id number] CC: Breast Pain (Right, 3-4 days)  HPI: Jamie Waters is a 27 y.o. female presenting for Breast Pain (Right, 3-4 days)  Started 3-4 days ago, he was sore on the lateral side of her right breast Sometimes can feel hardness compared to the rest of her breast tissue Has been very worried about it, causing her to lose sleep  Patient has been pleased with her weight loss Feels like weight loss has helped with her autoimmune disorder Was started on hydroxychloroquine about 2 years ago, couldn't tell a difference with it, stopped it about 6 mo ago Has seen several different rheumatologists in the past. 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 Patient is interested in counseling to help with ongoing mood problems. She feels safe at home, no thoughts of self-harm. She does feel like her anxiety is sometimes hard for her to control.  Relevant past medical, surgical, family and social history reviewed. Allergies and medications reviewed and updated. Social History   Tobacco Use  Smoking Status Never Smoker  Smokeless Tobacco Never Used   ROS: Per HPI   Objective:    BP 107/73   Pulse 80   Temp 97.7 F (36.5 C) (Oral)   Ht 5' (1.524 m)   Wt 131 lb (59.4 kg)   BMI 25.58 kg/m   Wt Readings from Last 3 Encounters:  09/11/17 131 lb (59.4 kg)  05/02/17 151 lb 9.6 oz (68.8 kg)  04/22/17 151 lb 12.8 oz (68.9 kg)    Gen: NAD, alert, cooperative with exam, NCAT EYES: EOMI, no conjunctival injection, or no icterus ENT:  TMs pearly gray b/l, OP without erythema LYMPH: no cervical LAD CV: NRRR, normal S1/S2 Resp:  CTABL, no wheezes, normal WOB Abd: +BS, soft, NTND. no guarding or organomegaly Ext: No edema, warm Neuro: Alert and oriented Breast: Some hardness palpated right lateral breast, difficult to determine if discrete nodule get another densities in breast tissue.. No redness.  Mildly tender with palpation. Psych: normal affect, mood is "down" Assessment & Plan:  Jamie Waters was seen today for breast pain.  Diagnoses and all orders for this visit:  Undifferentiated connective tissue disease ([place]) Patient would like to establish care with rheumatology Previously on plaquenil but she isnt sure if helping -     Ambulatory referral to Rheumatology -     CMP14+EGFR -     CBC with Differential/Platelet  Breast pain  will get ultrasound -     US BREAST LTD UNI RIGHT INC AXILLA; Future  Generalized anxiety disorder Discussed options, pt wants to wait to start medication for now.  Gave list of counselors in the area.  Patient to call to set up an appointment.  Follow up plan: Return in about 3 months (around 12/10/2017). Assunta Found, MD [place]

## 2017-09-11 NOTE — Patient Instructions (Signed)
[email or web address]  Your provider wants you to schedule an appointment with a Psychologist/Psychiatrist. The following list of offices requires the patient to call and make their own appointment, as there is information they need that only you can provide. Please feel free to choose form the following providers:  [facility]   [phone] Crisis Recovery in [place] [phone]  [facility]  [phone]   [address] [place], [place]  (Scheduled through [facility]) Must call and do an interview for appointment. Sees Children / Accepts Medicaid  [facility]    [phone]  [address], [address]    [place], [place]        [facility]  [phone] [address] [place], [place]  Evaluates for Autism but does not treat it Sees Children / Accepts Medicaid  [facility]    [phone] [address], [address]   [place], [place] Medication management, substance abuse, bipolar, grief, family, marriage, OCD, anxiety, PTSD Sees children / Accepts Medicaid  [facility]    [phone] [address] [place], [place] Sees children / Accepts Medicaid  [facility]  [phone] [address] [place], [place]   Dr Akinlayo     [phone] [address] [place], [place]  Sees ADD & ADHD for treatment Accepts Medicaid  [facility]  [phone] [address] [place], [place] Evaluates for Autism Accepts Medicaid  [facility]   [phone] [address]   [place], [place] Uses animal therapy  Sees children as young as 3 years old Accepts Medicaid  [facility]     [phone]    [address]  [place], [place] [address] Sees children Accepts Medicaid   

## 2017-09-12 ENCOUNTER — Encounter: Payer: Self-pay | Admitting: Pediatrics

## 2017-09-12 LAB — CBC WITH DIFFERENTIAL/PLATELET
Basophils Absolute: 0 10*3/uL (ref 0.0–0.2)
Basos: 1 %
EOS (ABSOLUTE): 0.2 10*3/uL (ref 0.0–0.4)
Eos: 4 %
Hematocrit: 39.7 % (ref 34.0–46.6)
Hemoglobin: 12.7 g/dL (ref 11.1–15.9)
Immature Grans (Abs): 0 10*3/uL (ref 0.0–0.1)
Immature Granulocytes: 0 %
Lymphocytes Absolute: 2.2 10*3/uL (ref 0.7–3.1)
Lymphs: 34 %
MCH: 29.8 pg (ref 26.6–33.0)
MCHC: 32 g/dL (ref 31.5–35.7)
MCV: 93 fL (ref 79–97)
Monocytes Absolute: 0.7 10*3/uL (ref 0.1–0.9)
Monocytes: 11 %
Neutrophils Absolute: 3.3 10*3/uL (ref 1.4–7.0)
Neutrophils: 50 %
Platelets: 354 10*3/uL (ref 150–379)
RBC: 4.26 x10E6/uL (ref 3.77–5.28)
RDW: 13.3 % (ref 12.3–15.4)
WBC: 6.5 10*3/uL (ref 3.4–10.8)

## 2017-09-12 LAB — CMP14+EGFR
ALT: 7 IU/L (ref 0–32)
AST: 12 IU/L (ref 0–40)
Albumin/Globulin Ratio: 2 (ref 1.2–2.2)
Albumin: 4.7 g/dL (ref 3.5–5.5)
Alkaline Phosphatase: 66 IU/L (ref 39–117)
BUN/Creatinine Ratio: 15 (ref 9–23)
BUN: 8 mg/dL (ref 6–20)
Bilirubin Total: 0.3 mg/dL (ref 0.0–1.2)
CO2: 24 mmol/L (ref 20–29)
Calcium: 9.8 mg/dL (ref 8.7–10.2)
Chloride: 102 mmol/L (ref 96–106)
Creatinine, Ser: 0.55 mg/dL — ABNORMAL LOW (ref 0.57–1.00)
GFR calc Af Amer: 151 mL/min/{1.73_m2} (ref >59)
GFR calc non Af Amer: 131 mL/min/{1.73_m2} (ref >59)
Globulin, Total: 2.4 g/dL (ref 1.5–4.5)
Glucose: 87 mg/dL (ref 65–99)
Potassium: 4.3 mmol/L (ref 3.5–5.2)
Sodium: 140 mmol/L (ref 134–144)
Total Protein: 7.1 g/dL (ref 6.0–8.5)

## 2017-09-17 ENCOUNTER — Ambulatory Visit (HOSPITAL_COMMUNITY)
Admission: RE | Admit: 2017-09-17 | Discharge: 2017-09-17 | Disposition: A | Discharge status: Home / Self Care | Payer: BLUE CROSS/BLUE SHIELD | Source: Ambulatory Visit | Attending: Pediatrics | Admitting: Pediatrics

## 2017-09-17 DIAGNOSIS — N644 Mastodynia: Secondary | ICD-10-CM

## 2017-09-30 ENCOUNTER — Telehealth: Payer: Self-pay

## 2017-09-30 NOTE — Telephone Encounter (Signed)
[facility] - Writer left message.

## 2017-10-10 DIAGNOSIS — R768 Other specified abnormal immunological findings in serum: Secondary | ICD-10-CM | POA: Diagnosis not present

## 2017-10-10 DIAGNOSIS — M797 Fibromyalgia: Secondary | ICD-10-CM | POA: Diagnosis not present

## 2017-10-17 ENCOUNTER — Telehealth: Payer: Self-pay

## 2017-10-17 NOTE — Telephone Encounter (Signed)
[facility] - Writer left a voice mail message message.Scientist, research (medical).  Writer routed an inbasket email to the PCP  and  Dr. Vanetta ShawlHisada. Writer left message on 10-17-2017; 10-14-2017 and 09-30-2017.  Patient PHQ-9 score was a 16 in the epic chart

## 2017-10-28 ENCOUNTER — Telehealth: Payer: Self-pay | Admitting: Pediatrics

## 2017-10-28 NOTE — Telephone Encounter (Signed)
Pt called apt made.

## 2017-10-30 ENCOUNTER — Ambulatory Visit (INDEPENDENT_AMBULATORY_CARE_PROVIDER_SITE_OTHER): Payer: BLUE CROSS/BLUE SHIELD | Admitting: Licensed Clinical Social Worker

## 2017-10-30 ENCOUNTER — Telehealth: Payer: Self-pay | Admitting: *Deleted

## 2017-10-30 ENCOUNTER — Encounter (HOSPITAL_COMMUNITY): Payer: Self-pay | Admitting: Licensed Clinical Social Worker

## 2017-10-30 ENCOUNTER — Encounter: Payer: BLUE CROSS/BLUE SHIELD | Admitting: Pediatrics

## 2017-10-30 DIAGNOSIS — F3181 Bipolar II disorder: Secondary | ICD-10-CM

## 2017-10-30 MED ORDER — HYDROCORTISONE 2.5 % RE CREA: 1.0000 "application " | TOPICAL_CREAM | Freq: Two times a day (BID) | RECTAL | 0 refills | Status: DC

## 2017-10-30 NOTE — Telephone Encounter (Signed)
Called patient to get more symptoms from patient.  Patient states that she has hemorrhoids for a few months but has gotten worse recently.  Patient states that she is having pain and itching in the rectal are and has tried preparation H with no relief.
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
## 2017-10-30 NOTE — Addendum Note (Signed)
Addended by: Johna SheriffVINCENT, CAROL L on: 10/30/2017 04:30 PM   Modules accepted: Orders

## 2017-10-30 NOTE — Telephone Encounter (Signed)
Is she still constipated? Make sure to drink lots of water. Should not strain for stool. I sent in hydrocortisone Rx. Follow up 2-3 weeks if not improved.

## 2017-10-30 NOTE — Progress Notes (Signed)
error 

## 2017-10-31 ENCOUNTER — Encounter (HOSPITAL_COMMUNITY): Payer: Self-pay | Admitting: Psychiatry

## 2017-10-31 ENCOUNTER — Ambulatory Visit (INDEPENDENT_AMBULATORY_CARE_PROVIDER_SITE_OTHER): Payer: BLUE CROSS/BLUE SHIELD | Admitting: Psychiatry

## 2017-10-31 VITALS — BP 110/66 | HR 110 | Ht 60.0 in | Wt 125.8 lb

## 2017-10-31 DIAGNOSIS — F411 Generalized anxiety disorder: Secondary | ICD-10-CM | POA: Diagnosis not present

## 2017-10-31 DIAGNOSIS — G4700 Insomnia, unspecified: Secondary | ICD-10-CM | POA: Diagnosis not present

## 2017-10-31 DIAGNOSIS — Z811 Family history of alcohol abuse and dependence: Secondary | ICD-10-CM

## 2017-10-31 DIAGNOSIS — F4010 Social phobia, unspecified: Secondary | ICD-10-CM

## 2017-10-31 DIAGNOSIS — R450 Nervousness: Secondary | ICD-10-CM

## 2017-10-31 DIAGNOSIS — F410 Panic disorder [episodic paroxysmal anxiety] without agoraphobia: Secondary | ICD-10-CM

## 2017-10-31 DIAGNOSIS — Z915 Personal history of self-harm: Secondary | ICD-10-CM

## 2017-10-31 DIAGNOSIS — Z62810 Personal history of physical and sexual abuse in childhood: Secondary | ICD-10-CM | POA: Diagnosis not present

## 2017-10-31 DIAGNOSIS — Z62811 Personal history of psychological abuse in childhood: Secondary | ICD-10-CM | POA: Diagnosis not present

## 2017-10-31 DIAGNOSIS — Z810 Family history of intellectual disabilities: Secondary | ICD-10-CM | POA: Diagnosis not present

## 2017-10-31 DIAGNOSIS — F4312 Post-traumatic stress disorder, chronic: Secondary | ICD-10-CM | POA: Diagnosis not present

## 2017-10-31 DIAGNOSIS — F332 Major depressive disorder, recurrent severe without psychotic features: Secondary | ICD-10-CM

## 2017-10-31 MED ORDER — LORAZEPAM 1 MG PO TABS: ORAL_TABLET | ORAL | 0 refills | Status: DC

## 2017-10-31 MED ORDER — SERTRALINE HCL 50 MG PO TABS: ORAL_TABLET | ORAL | 0 refills | Status: DC
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
## 2017-11-19 ENCOUNTER — Ambulatory Visit (HOSPITAL_COMMUNITY): Payer: Self-pay | Admitting: Licensed Clinical Social Worker

## 2017-11-25 ENCOUNTER — Ambulatory Visit (HOSPITAL_COMMUNITY): Payer: Self-pay | Admitting: Licensed Clinical Social Worker

## 2017-12-25 ENCOUNTER — Ambulatory Visit (HOSPITAL_COMMUNITY): Payer: BLUE CROSS/BLUE SHIELD | Admitting: Psychiatry

## 2017-12-30 ENCOUNTER — Ambulatory Visit (HOSPITAL_COMMUNITY): Payer: Self-pay | Admitting: Licensed Clinical Social Worker

## 2018-01-14 ENCOUNTER — Encounter (HOSPITAL_COMMUNITY): Payer: Self-pay | Admitting: Psychiatry

## 2018-01-14 ENCOUNTER — Ambulatory Visit (INDEPENDENT_AMBULATORY_CARE_PROVIDER_SITE_OTHER): Payer: BLUE CROSS/BLUE SHIELD | Admitting: Psychiatry

## 2018-01-14 VITALS — BP 110/68 | HR 79 | Ht 60.0 in | Wt 117.0 lb

## 2018-01-14 DIAGNOSIS — F4321 Adjustment disorder with depressed mood: Secondary | ICD-10-CM

## 2018-01-14 DIAGNOSIS — F4312 Post-traumatic stress disorder, chronic: Secondary | ICD-10-CM

## 2018-01-14 DIAGNOSIS — Z811 Family history of alcohol abuse and dependence: Secondary | ICD-10-CM

## 2018-01-14 DIAGNOSIS — Z634 Disappearance and death of family member: Secondary | ICD-10-CM | POA: Diagnosis not present

## 2018-01-14 DIAGNOSIS — F411 Generalized anxiety disorder: Secondary | ICD-10-CM

## 2018-01-14 DIAGNOSIS — Z818 Family history of other mental and behavioral disorders: Secondary | ICD-10-CM | POA: Diagnosis not present

## 2018-01-14 DIAGNOSIS — Z79899 Other long term (current) drug therapy: Secondary | ICD-10-CM | POA: Diagnosis not present

## 2018-01-14 MED ORDER — SERTRALINE HCL 100 MG PO TABS: 100.0000 mg | ORAL_TABLET | Freq: Every day | ORAL | 0 refills | Status: DC

## 2018-01-14 MED ORDER — HYDROXYZINE HCL 10 MG PO TABS: 10.0000 mg | ORAL_TABLET | Freq: Every day | ORAL | 0 refills | Status: DC | PRN

## 2018-01-14 NOTE — Progress Notes (Signed)
BH MD/PA/NP OP Progress Note  01/14/2018 12:42 PM PETRONELLA SHUFORD  MRN:  [id number]  Chief Complaint: Gearldine Shown passed away HPI: SERAPHIM AFFINITO reports that she has been coping with the grief from her recent loss in the family, grandmother passed away from cancer and pneumonia.  Patient had been taking care of grandmother for the past 6 years due to grandmother's multiple health issues.  Spent time with her processing her grief and upcoming transitions in her life, how she will spend her time, career considerations.  She also wants to have a child and is thinking about whether or not now may be a good time to consider IVF given her medical needs.  She reports that her and her husband are doing well and she has excellent support from him.  Denies any suicidal thoughts or unsafe behaviors or substance abuse.  Feels ongoing struggle with depression and anxiety and is not sure if Zoloft has had any positive benefits yet, particularly given the recent stressors.  Denies any nausea or upset stomach or other side effects, we agreed to increase to 100 mg daily.  Spent time discussing expected effects and side effects with an increase in Zoloft.  We agreed to follow-up in 8 weeks.  She will also continue in individual therapy with Beth at this office.  Visit Diagnosis:    ICD-10-CM   1. Generalized anxiety disorder F41.1   2. Chronic post-traumatic stress disorder (PTSD) F43.12 sertraline (ZOLOFT) 100 MG tablet    hydrOXYzine (ATARAX/VISTARIL) 10 MG tablet  3. Grief F43.21     Past Psychiatric History: See intake H&P for full details. Reviewed, with no updates at this time.   Past Medical History:  Past Medical History:  Diagnosis Date  . Anxiety   . Arthritis   . Asthma   . Bipolar II disorder (HCC)   . Depression   . Ectopic fetus   . Ectopic pregnancy   . Fibromyalgia   . IBS (irritable bowel syndrome)   . Miscarriage   . PCOS (polycystic ovarian syndrome) 2015  . Raynauds syndrome  2015    Past Surgical History:  Procedure Laterality Date  . cyst removed from throat    . DIAGNOSTIC LAPAROSCOPY WITH REMOVAL OF ECTOPIC PREGNANCY    . DIAGNOSTIC LAPAROSCOPY WITH REMOVAL OF ECTOPIC PREGNANCY Left 09/13/2016   Procedure: DIAGNOSTIC LAPAROSCOPY WITH REMOVAL OF ECTOPIC PREGNANCY;  Surgeon: Philip Aspen, DO;  Location: [facility];  Service: Gynecology;  Laterality: Left;  . LAPAROSCOPY N/A 09/11/2015   Procedure: LAPAROSCOPY OPERATIVE, Right Salpingectomy with Removal Ectopic Pregnancy;  Surgeon: Waynard Reeds, MD;  Location: [facility];  Service: Gynecology;  Laterality: N/A;  . THYROIDECTOMY, PARTIAL  2010    Family Psychiatric History: See intake H&P for full details. Reviewed, with no updates at this time.   Family History:  Family History  Problem Relation Age of Onset  . Dementia Father   . Alcohol abuse Father   . Bipolar disorder Sister   . Autism spectrum disorder Sister   . Lupus Maternal Grandmother   . Cancer Maternal Grandmother   . Cancer Paternal Grandfather     Social History:  Social History   Socioeconomic History  . Marital status: Married    Spouse name: Reuel Boom  . Number of children: Not on file  . Years of education: Not on file  . Highest education level: Not on file  Occupational History  . Not on file  Social Needs  . Financial resource strain: Not hard  at all  . Food insecurity:    Worry: Never true    Inability: Never true  . Transportation needs:    Medical: No    Non-medical: No  Tobacco Use  . Smoking status: Never Smoker  . Smokeless tobacco: Never Used  Substance and Sexual Activity  . Alcohol use: No  . Drug use: No  . Sexual activity: Yes    Birth control/protection: None  Lifestyle  . Physical activity:    Days per week: 7 days    Minutes per session: 80 min  . Stress: Very much  Relationships  . Social connections:    Talks on phone: Three times a week    Gets together: Once a week    Attends religious service:  Never    Active member of club or organization: No    Attends meetings of clubs or organizations: Never    Relationship status: Married  Other Topics Concern  . Not on file  Social History Narrative  . Not on file    Allergies:  Allergies  Allergen Reactions  . Cymbalta [Duloxetine Hcl] Shortness Of Breath  . Norvasc [Amlodipine Besylate] Shortness Of Breath and Other (See Comments)    Reaction:  Tightness in chest     Metabolic Disorder Labs: No results found for: HGBA1C, MPG No results found for: PROLACTIN No results found for: CHOL, TRIG, HDL, CHOLHDL, VLDL, LDLCALC No results found for: TSH  Therapeutic Level Labs: No results found for: LITHIUM No results found for: VALPROATE No components found for:  CBMZ  Current Medications: Current Outpatient Medications  Medication Sig Dispense Refill  . albuterol (PROVENTIL HFA;VENTOLIN HFA) 108 (90 Base) MCG/ACT inhaler Inhale 1-2 puffs into the lungs every 6 (six) hours as needed for wheezing or shortness of breath.    . cyclobenzaprine (FLEXERIL) 10 MG tablet Take 1 tablet (10 mg total) by mouth 3 (three) times daily as needed for muscle spasms. 60 tablet 3  . linaclotide (LINZESS) 145 MCG CAPS capsule Take by mouth.    . sertraline (ZOLOFT) 100 MG tablet Take 1 tablet (100 mg total) by mouth daily. 90 tablet 0  . hydrOXYzine (ATARAX/VISTARIL) 10 MG tablet Take 1 tablet (10 mg total) by mouth daily as needed (take 1-2 tablets for sleep or anxiety, up to 4 tablets daily). 90 tablet 0   No current facility-administered medications for this visit.      Musculoskeletal: Strength & Muscle Tone: within normal limits Gait & Station: normal Patient leans: N/A  Psychiatric Specialty Exam: ROS  Blood pressure 110/68, pulse 79, height 5' (1.524 m), weight 117 lb (53.1 kg), unknown if currently breastfeeding.Body mass index is 22.85 kg/m.  General Appearance: Casual and multiple peircings, tattoos  Eye Contact:  Good  Speech:   Clear and Coherent and Normal Rate  Volume:  Normal  Mood:  Dysphoric  Affect:  Congruent and Tearful  Thought Process:  Goal Directed and Descriptions of Associations: Intact  Orientation:  Full (Time, Place, and Person)  Thought Content: Logical   Suicidal Thoughts:  No  Homicidal Thoughts:  No  Memory:  Immediate;   Fair  Judgement:  Intact  Insight:  Present  Psychomotor Activity:  Normal  Concentration:  Concentration: Good  Recall:  Good  Fund of Knowledge: Good  Language: Good  Akathisia:  Negative  Handed:  Right  AIMS (if indicated): not done  Assets:  Communication Skills Desire for Improvement Financial Resources/Insurance Housing  ADL's:  Intact  Cognition: WNL  Sleep:  Fair   Screenings: GAD-7     Office Visit from 04/22/2017 in [facility] Office Visit from 01/17/2017 in [facility] Office Visit from 11/05/2016 in [facility] Counselor from 02/14/2016 in [facility] [place]  Total GAD-7 Score  PHQ2-9     Office Visit from 09/11/2017 in [facility] Office Visit from 04/22/2017 in [facility] Office Visit from 01/17/2017 in [facility] Office Visit from 12/19/2016 in [facility] Office Visit from 11/05/2016 in [facility]  PHQ-2 Total Score  0  4  PHQ-9 Total Score  -  13       Assessment and Plan:  KAYTLYNN KOCHAN presents for medication management follow-up in the context of recent stressors and the loss of her grandmother, who is like a parent figure to the patient.  She was also primary caretaker for her grandmother over the past 5-6 years while her grandmother was ill.  She is reflecting on her future goals and considerations including childbearing.  She continues to struggle with depression and anxiety symptoms, and has  experienced panic attack episodes.  The Ativan seems to make her quite tired, so we agreed to a trial of hydroxyzine on an as-needed basis.  We agreed to increase Zoloft as below and follow-up in 8 weeks.  Disclosed to patient that this Clinical research associate is leaving this practice at the end of August 2019, and patients always has the right to choose their provider. Reassured patient that office will work to provide smooth transition of care whether they wish to remain at this office, or to continue with this provider, or seek alternative care options in community.  They expressed understanding.  1. Generalized anxiety disorder   2. Chronic post-traumatic stress disorder (PTSD)   3. Grief     Status of current problems: stable  Labs Ordered: No orders of the defined types were placed in this encounter.   Labs Reviewed: n/a  Collateral Obtained/Records Reviewed: n/a  Plan:  Increase Zoloft to 100 mg daily Hydroxyzine 10-20 mg as needed for anxiety or sleep difficulty Continue in individual therapy Follow-up in 8 weeks  I spent 30 minutes with the patient in direct face-to-face clinical care.  Greater than 50% of this time was spent in counseling and coordination of care with the patient.    Burnard Leigh, MD 01/14/2018, 12:42 PM

## 2018-01-17 ENCOUNTER — Other Ambulatory Visit (HOSPITAL_COMMUNITY): Payer: Self-pay

## 2018-01-17 DIAGNOSIS — F4312 Post-traumatic stress disorder, chronic: Secondary | ICD-10-CM

## 2018-01-17 MED ORDER — HYDROXYZINE HCL 10 MG PO TABS: ORAL_TABLET | ORAL | 0 refills | Status: DC

## 2018-01-30 ENCOUNTER — Ambulatory Visit (INDEPENDENT_AMBULATORY_CARE_PROVIDER_SITE_OTHER): Payer: BLUE CROSS/BLUE SHIELD | Admitting: Licensed Clinical Social Worker

## 2018-01-30 ENCOUNTER — Encounter (HOSPITAL_COMMUNITY): Payer: Self-pay | Admitting: Licensed Clinical Social Worker

## 2018-01-30 DIAGNOSIS — F4321 Adjustment disorder with depressed mood: Secondary | ICD-10-CM

## 2018-01-30 DIAGNOSIS — F411 Generalized anxiety disorder: Secondary | ICD-10-CM

## 2018-01-30 NOTE — Progress Notes (Signed)
   THERAPIST PROGRESS NOTE  Session Time: 2:10-3pm  Participation Level: Active  Behavioral Response: CasualAlertAnxious  Type of Therapy: Individual Therapy  Treatment Goals addressed: Coping  Interventions: CBT  Summary: Jamie Waters is a 27 y.o. female who presents for her initial individual counseling session. She came for her CCA in February and never returned due to financial strain. Spent a considerable amount of time building a trusting therapeutic relationship. Pt's grandmother passed away in December 09, 2022,; she was the caregiver. Pt has had a lot of losses in the past few years: 4 pregnancies, grandpa, father, aunt and currently her grandmother. Pt presents sad, anxious with no purpose or direction. Pt lives right beside her mother, but her sister lives with pt and her husband. Her husband is her support system. Reviewed goals for therapy: stressors, triggers, pregnancy losses, grief. Gave pt information on the stages of grief. Gave pt a grief journal workbook. Suggested to pt to contact [facility] for grief support groups.  Suicidal/Homicidal: Nowithout intent/plan  Therapist Response: Assessed pt's current functioning and noted/reviewed progress,  Plan: Return again in 2 weeks. Review grief workbook  Diagnosis: Axis I: GAD, Grief        Shea Kapur S, LCAS 01/30/2018

## 2018-03-13 ENCOUNTER — Ambulatory Visit (HOSPITAL_COMMUNITY): Payer: Self-pay | Admitting: Licensed Clinical Social Worker

## 2018-03-17 DIAGNOSIS — K648 Other hemorrhoids: Secondary | ICD-10-CM | POA: Diagnosis not present

## 2018-03-17 DIAGNOSIS — R140 Abdominal distension (gaseous): Secondary | ICD-10-CM | POA: Diagnosis not present

## 2018-03-17 DIAGNOSIS — K5900 Constipation, unspecified: Secondary | ICD-10-CM | POA: Diagnosis not present

## 2018-03-20 ENCOUNTER — Ambulatory Visit (INDEPENDENT_AMBULATORY_CARE_PROVIDER_SITE_OTHER): Payer: BLUE CROSS/BLUE SHIELD | Admitting: Psychiatry

## 2018-03-20 ENCOUNTER — Encounter (HOSPITAL_COMMUNITY): Payer: Self-pay | Admitting: Psychiatry

## 2018-03-20 DIAGNOSIS — Z811 Family history of alcohol abuse and dependence: Secondary | ICD-10-CM | POA: Diagnosis not present

## 2018-03-20 DIAGNOSIS — F411 Generalized anxiety disorder: Secondary | ICD-10-CM | POA: Diagnosis not present

## 2018-03-20 DIAGNOSIS — F4312 Post-traumatic stress disorder, chronic: Secondary | ICD-10-CM

## 2018-03-20 DIAGNOSIS — Z818 Family history of other mental and behavioral disorders: Secondary | ICD-10-CM

## 2018-03-20 MED ORDER — HYDROXYZINE HCL 10 MG PO TABS: ORAL_TABLET | ORAL | 0 refills | Status: DC

## 2018-03-20 MED ORDER — FLUOXETINE HCL 20 MG PO CAPS: 20.0000 mg | ORAL_CAPSULE | Freq: Every day | ORAL | 0 refills | Status: DC

## 2018-03-20 NOTE — Progress Notes (Signed)
BH MD/PA/NP OP Progress Note  03/20/2018 12:10 PM Jamie Waters  MRN:  [id number]  Chief Complaint: Grandmother passed away HPI: Jamie Waters is had some constipation with the Zoloft, so she discontinued.  She had been wondering about switching to a different antidepressant, we discussed initiation of fluoxetine, and agreed to start 20 mg daily.  She has no suicidal thoughts but definitely feels less hopeful and more depressed in the absence of Zoloft over the past 1-2 weeks.  She reports that her constipation is gradually resolving but not completely yet.  She has not had any self-injurious behaviors or other unsafe behaviors.  We spent time discussing the dynamic between her and her family, which continues to be a significant source of support.  She feels hopeful that her mood will improve with Prozac and I encouraged her to reach out to this writer via my chart if she has any issues with constipation rather than to discontinue the medicine.  Visit Diagnosis:    ICD-10-CM   1. Chronic post-traumatic stress disorder (PTSD) F43.12 FLUoxetine (PROZAC) 20 MG capsule    hydrOXYzine (ATARAX/VISTARIL) 10 MG tablet  2. Generalized anxiety disorder F41.1 FLUoxetine (PROZAC) 20 MG capsule    Past Psychiatric History: See intake H&P for full details. Reviewed, with no updates at this time.   Past Medical History:  Past Medical History:  Diagnosis Date  . Anxiety   . Arthritis   . Asthma   . Bipolar II disorder (HCC)   . Depression   . Ectopic fetus   . Ectopic pregnancy   . Fibromyalgia   . IBS (irritable bowel syndrome)   . Miscarriage   . PCOS (polycystic ovarian syndrome) 2015  . Raynauds syndrome 2015    Past Surgical History:  Procedure Laterality Date  . cyst removed from throat    . DIAGNOSTIC LAPAROSCOPY WITH REMOVAL OF ECTOPIC PREGNANCY    . DIAGNOSTIC LAPAROSCOPY WITH REMOVAL OF ECTOPIC PREGNANCY Left 09/13/2016   Procedure: DIAGNOSTIC LAPAROSCOPY WITH REMOVAL OF ECTOPIC  PREGNANCY;  Surgeon: Philip Aspen, DO;  Location: [facility];  Service: Gynecology;  Laterality: Left;  . LAPAROSCOPY N/A 09/11/2015   Procedure: LAPAROSCOPY OPERATIVE, Right Salpingectomy with Removal Ectopic Pregnancy;  Surgeon: Waynard Reeds, MD;  Location: [facility];  Service: Gynecology;  Laterality: N/A;  . THYROIDECTOMY, PARTIAL  2010    Family Psychiatric History: See intake H&P for full details. Reviewed, with no updates at this time.   Family History:  Family History  Problem Relation Age of Onset  . Dementia Father   . Alcohol abuse Father   . Bipolar disorder Sister   . Autism spectrum disorder Sister   . Lupus Maternal Grandmother   . Cancer Maternal Grandmother   . Cancer Paternal Grandfather     Social History:  Social History   Socioeconomic History  . Marital status: Married    Spouse name: Reuel Boom  . Number of children: Not on file  . Years of education: Not on file  . Highest education level: Not on file  Occupational History  . Not on file  Social Needs  . Financial resource strain: Not hard at all  . Food insecurity:    Worry: Never true    Inability: Never true  . Transportation needs:    Medical: No    Non-medical: No  Tobacco Use  . Smoking status: Never Smoker  . Smokeless tobacco: Never Used  Substance and Sexual Activity  . Alcohol use: No  . Drug use:  No  . Sexual activity: Yes    Birth control/protection: None  Lifestyle  . Physical activity:    Days per week: 7 days    Minutes per session: 80 min  . Stress: Very much  Relationships  . Social connections:    Talks on phone: Three times a week    Gets together: Once a week    Attends religious service: Never    Active member of club or organization: No    Attends meetings of clubs or organizations: Never    Relationship status: Married  Other Topics Concern  . Not on file  Social History Narrative  . Not on file    Allergies:  Allergies  Allergen Reactions  . Cymbalta  [Duloxetine Hcl] Shortness Of Breath  . Norvasc [Amlodipine Besylate] Shortness Of Breath and Other (See Comments)    Reaction:  Tightness in chest     Metabolic Disorder Labs: No results found for: HGBA1C, MPG No results found for: PROLACTIN No results found for: CHOL, TRIG, HDL, CHOLHDL, VLDL, LDLCALC No results found for: TSH  Therapeutic Level Labs: No results found for: LITHIUM No results found for: VALPROATE No components found for:  CBMZ  Current Medications: Current Outpatient Medications  Medication Sig Dispense Refill  . albuterol (PROVENTIL HFA;VENTOLIN HFA) 108 (90 Base) MCG/ACT inhaler Inhale 1-2 puffs into the lungs every 6 (six) hours as needed for wheezing or shortness of breath.    . cyclobenzaprine (FLEXERIL) 10 MG tablet Take 1 tablet (10 mg total) by mouth 3 (three) times daily as needed for muscle spasms. 60 tablet 3  . FLUoxetine (PROZAC) 20 MG capsule Take 1 capsule (20 mg total) by mouth daily. 90 capsule 0  . hydrOXYzine (ATARAX/VISTARIL) 10 MG tablet Take one to two tablets twice daily as needed for anxiety or sleep. Do not exceed 4 tabs a day 120 tablet 0  . linaclotide (LINZESS) 145 MCG CAPS capsule Take by mouth.     No current facility-administered medications for this visit.     Musculoskeletal: Strength & Muscle Tone: within normal limits Gait & Station: normal Patient leans: N/A  Psychiatric Specialty Exam: ROS  unknown if currently breastfeeding.There is no height or weight on file to calculate BMI.  General Appearance: Casual and multiple peircings, tattoos  Eye Contact:  Good  Speech:  Clear and Coherent and Normal Rate  Volume:  Normal  Mood:  Dysphoric  Affect:  Congruent and Tearful  Thought Process:  Goal Directed and Descriptions of Associations: Intact  Orientation:  Full (Time, Place, and Person)  Thought Content: Logical   Suicidal Thoughts:  No  Homicidal Thoughts:  No  Memory:  Immediate;   Fair  Judgement:  Intact   Insight:  Present  Psychomotor Activity:  Normal  Concentration:  Concentration: Good  Recall:  Good  Fund of Knowledge: Good  Language: Good  Akathisia:  Negative  Handed:  Right  AIMS (if indicated): not done  Assets:  Communication Skills Desire for Improvement Financial Resources/Insurance Housing  ADL's:  Intact  Cognition: WNL  Sleep:  Fair   Screenings: GAD-7     Office Visit from 04/22/2017 in [facility] Office Visit from 01/17/2017 in [facility] Office Visit from 11/05/2016 in [facility] Counselor from 02/14/2016 in [facility] [place]  Total GAD-7 Score  15  13  18  17     PHQ2-9     Office Visit from 09/11/2017 in [facility] Office Visit from 04/22/2017 in [facility] Office Visit from 01/17/2017 in [facility] Office Visit from 12/19/2016 in [facility] Office Visit from 11/05/2016 in [facility]  PHQ-2 Total Score  6  2  4   0  4  PHQ-9 Total Score  16  10  11   -  13       Assessment and Plan:  Jamie Waters had significant constipation from Zoloft so ended up self discontinuing.  She is on Linzess for IBS type C, and thinks that may be the Zoloft did not agree with the Linzess.  We agreed to try Prozac in combination with Linzess, and there do not appear to be any interactions between the 2 medications.  She reports that she has definitely felt her mood has been worse, more depressed, apathetic since being off Zoloft.  We spent time discussing some coping strategies and she continues to have ongoing good support from her husband and mother.  She denies any acute suicidality or self injurious behaviors.   1. Chronic post-traumatic stress disorder (PTSD)   2. Generalized anxiety disorder     Status of current problems: stable  Labs Ordered: No orders of the  defined types were placed in this encounter.   Labs Reviewed: n/a  Collateral Obtained/Records Reviewed: n/a  Plan:  Prozac 20 mg daily Zoloft self-discontinued Hydroxyzine 10-20 mg as needed for anxiety or sleep difficulty Continue in individual therapy Follow-up in 8 weeks  Burnard LeighAlexander Arya Eksir, MD 03/20/2018, 12:10 PM

## 2018-03-27 ENCOUNTER — Encounter (HOSPITAL_COMMUNITY): Payer: Self-pay | Admitting: Psychiatry

## 2018-03-30 ENCOUNTER — Encounter (HOSPITAL_COMMUNITY): Payer: Self-pay | Admitting: Psychiatry

## 2018-04-02 ENCOUNTER — Ambulatory Visit (HOSPITAL_COMMUNITY): Payer: Self-pay | Admitting: Licensed Clinical Social Worker

## 2018-04-16 ENCOUNTER — Ambulatory Visit (HOSPITAL_COMMUNITY): Payer: Self-pay | Admitting: Licensed Clinical Social Worker

## 2018-04-23 ENCOUNTER — Encounter (HOSPITAL_COMMUNITY): Payer: Self-pay

## 2018-04-30 ENCOUNTER — Ambulatory Visit (HOSPITAL_COMMUNITY): Payer: Self-pay | Admitting: Licensed Clinical Social Worker

## 2018-05-09 DIAGNOSIS — F411 Generalized anxiety disorder: Secondary | ICD-10-CM | POA: Diagnosis not present

## 2018-06-19 DIAGNOSIS — R140 Abdominal distension (gaseous): Secondary | ICD-10-CM | POA: Diagnosis not present

## 2018-06-19 DIAGNOSIS — K5900 Constipation, unspecified: Secondary | ICD-10-CM | POA: Diagnosis not present

## 2018-06-19 DIAGNOSIS — K5904 Chronic idiopathic constipation: Secondary | ICD-10-CM | POA: Diagnosis not present

## 2018-06-30 DIAGNOSIS — F411 Generalized anxiety disorder: Secondary | ICD-10-CM | POA: Diagnosis not present

## 2018-07-09 DIAGNOSIS — Z124 Encounter for screening for malignant neoplasm of cervix: Secondary | ICD-10-CM | POA: Diagnosis not present

## 2018-07-28 DIAGNOSIS — R140 Abdominal distension (gaseous): Secondary | ICD-10-CM | POA: Diagnosis not present

## 2018-07-28 DIAGNOSIS — K5904 Chronic idiopathic constipation: Secondary | ICD-10-CM | POA: Diagnosis not present

## 2018-08-10 DIAGNOSIS — J45909 Unspecified asthma, uncomplicated: Secondary | ICD-10-CM | POA: Diagnosis not present

## 2018-08-10 DIAGNOSIS — J029 Acute pharyngitis, unspecified: Secondary | ICD-10-CM | POA: Diagnosis not present

## 2018-08-10 DIAGNOSIS — Z79899 Other long term (current) drug therapy: Secondary | ICD-10-CM | POA: Diagnosis not present

## 2018-08-10 DIAGNOSIS — G43009 Migraine without aura, not intractable, without status migrainosus: Secondary | ICD-10-CM | POA: Insufficient documentation

## 2018-08-10 DIAGNOSIS — R42 Dizziness and giddiness: Secondary | ICD-10-CM | POA: Diagnosis present

## 2018-08-11 ENCOUNTER — Other Ambulatory Visit: Payer: Self-pay

## 2018-08-11 ENCOUNTER — Encounter (HOSPITAL_COMMUNITY): Payer: Self-pay | Admitting: *Deleted

## 2018-08-11 ENCOUNTER — Emergency Department (HOSPITAL_COMMUNITY)
Admission: EM | Admit: 2018-08-11 | Discharge: 2018-08-11 | Disposition: A | Discharge status: Home / Self Care | Payer: BLUE CROSS/BLUE SHIELD | Attending: Emergency Medicine | Admitting: Emergency Medicine

## 2018-08-11 DIAGNOSIS — J029 Acute pharyngitis, unspecified: Secondary | ICD-10-CM

## 2018-08-11 DIAGNOSIS — G43009 Migraine without aura, not intractable, without status migrainosus: Secondary | ICD-10-CM

## 2018-08-11 HISTORY — DX: Post-traumatic stress disorder, unspecified: F43.10

## 2018-08-11 LAB — GROUP A STREP BY PCR: Group A Strep by PCR: NOT DETECTED

## 2018-08-11 MED ORDER — SODIUM CHLORIDE 0.9 % IV BOLUS
1000.0000 mL | Freq: Once | INTRAVENOUS | Status: AC
  Administered 2018-08-11: 1000 mL via INTRAVENOUS

## 2018-08-11 MED ORDER — METOCLOPRAMIDE HCL 5 MG/ML IJ SOLN
10.0000 mg | Freq: Once | INTRAMUSCULAR | Status: AC
  Administered 2018-08-11: 10 mg via INTRAVENOUS
  Filled 2018-08-11: qty 2

## 2018-08-11 MED ORDER — DEXAMETHASONE SODIUM PHOSPHATE 10 MG/ML IJ SOLN
10.0000 mg | Freq: Once | INTRAMUSCULAR | Status: AC
  Administered 2018-08-11: 10 mg via INTRAVENOUS
  Filled 2018-08-11: qty 1

## 2018-08-11 MED ORDER — DIPHENHYDRAMINE HCL 50 MG/ML IJ SOLN
25.0000 mg | Freq: Once | INTRAMUSCULAR | Status: AC
  Administered 2018-08-11: 25 mg via INTRAVENOUS
  Filled 2018-08-11: qty 1

## 2018-08-11 NOTE — ED Triage Notes (Signed)
Pt states she started taking a new medication x 5 days ago and for the last 3 days she has had a headache; pt states today her throat has been feeling tight with feelings of hot and cold

## 2018-08-11 NOTE — ED Provider Notes (Signed)
[facility] EMERGENCY DEPARTMENT Provider Note   CSN: [id number] Arrival date & time: 08/10/18  2337  Time seen 3:15 AM   History   Chief Complaint Chief Complaint  Patient presents with  . Allergic Reaction    HPI Jamie Waters is a 27 y.o. female.  HPI patient states she works in a Education officer, environmental for the past 4 months.  She states it is very hot at work.  She states tonight at work she started feeling lightheaded and her head was swimmy headed and when she went in to the bathroom and sat down she felt better.  She also states her throat feels tight and it hurts to swallow and it is hard to swallow.  She only has pain when she swallows.  She is not aware of being around anybody else who is sick.  She states for 3 days she has had a diffuse headache that is dull with nausea but no vomiting.  She states she has light and noise sensitivity.  She has taken acetaminophen without result.  She states she used to have migraine headaches in the past and used to get them fairly frequently but she changed her diet and now has not had one for about a year.  She also complains of abdominal cramps and has seen a gastroenterologist for chronic constipation.  She used to be on Linzess and now she is on Amitiza for about 5 days.  She is taking it with Metamucil and states she still has not had a bowel movement.  She states however she is having a normal appetite.  She states she feels weak, she has not had a fever and is having normal urination.  PCP Johna Sheriff, MD   Past Medical History:  Diagnosis Date  . Anxiety   . Arthritis   . Asthma   . Bipolar II disorder (HCC)   . Depression   . Ectopic fetus   . Ectopic pregnancy   . Fibromyalgia   . IBS (irritable bowel syndrome)   . Miscarriage   . PCOS (polycystic ovarian syndrome) 2015  . PTSD (post-traumatic stress disorder)   . Raynauds syndrome 2015    Patient Active Problem List   Diagnosis Date Noted  . Connective tissue disorder  (HCC) 01/17/2017  . Fibromyalgia 11/05/2016  . TMJ click 11/05/2016  . Chronic bilateral low back pain without sciatica 11/05/2016  . Generalized anxiety disorder 11/05/2016  . Undifferentiated connective tissue disease (HCC) 11/05/2016  . History of multiple miscarriages 11/05/2016  . Migraine without status migrainosus, not intractable 11/05/2016  . Ectopic pregnancy, tubal 09/11/2015  . Ectopic pregnancy 10/30/2014    Past Surgical History:  Procedure Laterality Date  . cyst removed from throat    . DIAGNOSTIC LAPAROSCOPY WITH REMOVAL OF ECTOPIC PREGNANCY    . DIAGNOSTIC LAPAROSCOPY WITH REMOVAL OF ECTOPIC PREGNANCY Left 09/13/2016   Procedure: DIAGNOSTIC LAPAROSCOPY WITH REMOVAL OF ECTOPIC PREGNANCY;  Surgeon: Philip Aspen, DO;  Location: [facility];  Service: Gynecology;  Laterality: Left;  . LAPAROSCOPY N/A 09/11/2015   Procedure: LAPAROSCOPY OPERATIVE, Right Salpingectomy with Removal Ectopic Pregnancy;  Surgeon: Waynard Reeds, MD;  Location: [facility];  Service: Gynecology;  Laterality: N/A;  . THYROIDECTOMY, PARTIAL  2010     OB History    Gravida  4   Para      Term      Preterm      AB  4   Living  0     SAB  1   TAB      Ectopic  3   Multiple  0   Live Births  0            Home Medications    Prior to Admission medications   Medication Sig Start Date End Date Taking? Authorizing Provider  albuterol (PROVENTIL HFA;VENTOLIN HFA) 108 (90 Base) MCG/ACT inhaler Inhale 1-2 puffs into the lungs every 6 (six) hours as needed for wheezing or shortness of breath.    [provider]  cyclobenzaprine (FLEXERIL) 10 MG tablet Take 1 tablet (10 mg total) by mouth 3 (three) times daily as needed for muscle spasms. 04/22/17   Johna SheriffVincent, Carol L, MD  FLUoxetine (PROZAC) 20 MG capsule Take 1 capsule (20 mg total) by mouth daily. 03/20/18 03/20/19  Burnard LeighEksir, Alexander Arya, MD  hydrOXYzine (ATARAX/VISTARIL) 10 MG tablet Take one to two tablets twice daily as needed for  anxiety or sleep. Do not exceed 4 tabs a day 03/20/18   Rene KocherEksir, Bo McclintockAlexander Arya, MD  linaclotide [facility](LINZESS) 145 MCG CAPS capsule Take by mouth. 07/30/17   [provider]    Family History Family History  Problem Relation Age of Onset  . Dementia Father   . Alcohol abuse Father   . Bipolar disorder Sister   . Autism spectrum disorder Sister   . Lupus Maternal Grandmother   . Cancer Maternal Grandmother   . Cancer Paternal Grandfather     Social History Social History   Tobacco Use  . Smoking status: Never Smoker  . Smokeless tobacco: Never Used  Substance Use Topics  . Alcohol use: No  . Drug use: No  employed   Allergies   Cymbalta [duloxetine hcl] and Norvasc [amlodipine besylate]   Review of Systems Review of Systems  All other systems reviewed and are negative.    Physical Exam Updated Vital Signs BP 124/79 (BP Location: Left Arm)   Pulse 72   Temp 97.9 F (36.6 C) (Oral)   Resp 18   Ht 5' (1.524 m)   Wt 49 kg   LMP 08/07/2018   SpO2 100%   BMI 21.09 kg/m   Vital signs normal    Physical Exam  Constitutional: She is oriented to person, place, and time. She appears well-developed and well-nourished.  Non-toxic appearance. She does not appear ill. No distress.  HENT:  Head: Normocephalic and atraumatic.  Right Ear: External ear normal.  Left Ear: External ear normal.  Nose: Nose normal. No mucosal edema or rhinorrhea.  Mouth/Throat: Mucous membranes are dry. No dental abscesses or uvula swelling. Posterior oropharyngeal erythema present. No oropharyngeal exudate, posterior oropharyngeal edema or tonsillar abscesses.  Eyes: Pupils are equal, round, and reactive to light. Conjunctivae and EOM are normal.  Neck: Normal range of motion and full passive range of motion without pain. Neck supple.  Cardiovascular: Normal rate, regular rhythm and normal heart sounds. Exam reveals no gallop and no friction rub.  No murmur heard. Pulmonary/Chest: Effort  normal and breath sounds normal. No respiratory distress. She has no wheezes. She has no rhonchi. She has no rales. She exhibits no tenderness and no crepitus.  Abdominal: Soft. Normal appearance and bowel sounds are normal. She exhibits no distension. There is no tenderness. There is no rebound and no guarding.  Musculoskeletal: Normal range of motion. She exhibits no edema or tenderness.  Moves all extremities well.   Neurological: She is alert and oriented to person, place, and time. She has normal strength. No cranial nerve deficit.  Skin: Skin is warm, dry and intact. No rash noted. No erythema. No pallor.  Psychiatric: She has a normal mood and affect. Her speech is normal and behavior is normal. Her mood appears not anxious.  Nursing note and vitals reviewed.    ED Treatments / Results  Labs (all labs ordered are listed, but only abnormal results are displayed) Results for orders placed or performed during the hospital encounter of 08/11/18  Group A Strep by PCR  Result Value Ref Range   Group A Strep by PCR NOT DETECTED NOT DETECTED   Laboratory interpretation all normal     EKG None  Radiology No results found.  Procedures Procedures (including critical care time)  Medications Ordered in ED Medications  sodium chloride 0.9 % bolus 1,000 mL (1,000 mLs Intravenous New Bag/Given 08/11/18 0433)  metoCLOPramide (REGLAN) injection 10 mg (10 mg Intravenous Given 08/11/18 0434)  diphenhydrAMINE (BENADRYL) injection 25 mg (25 mg Intravenous Given 08/11/18 0434)  dexamethasone (DECADRON) injection 10 mg (10 mg Intravenous Given 08/11/18 0434)     Initial Impression / Assessment and Plan / ED Course  I have reviewed the triage vital signs and the nursing notes.  Pertinent labs & imaging results that were available during my care of the patient were reviewed by me and considered in my medical decision making (see chart for details).     Patient was given IV fluids and  migraine cocktail.  I was hoping the dexamethasone would help with her headache and also with her sore throat.  Rapid strep was done.  Recheck at 5:45 AM patient is sleeping, when awakened she states her headache is practically gone.  She feels improved enough to go home.  She was given the results of her rapid strep.  When I look up the side effects of the Amitiza it describes dry mouth as a possible side effect, I think possibly her throat is sore from having dryness.  At this point I do not see anything that would make me tell her to stop her medication, however she should call her gastroenterologist office this morning and discussed that with him.  Final Clinical Impressions(s) / ED Diagnoses   Final diagnoses:  Migraine without aura and without status migrainosus, not intractable  Sore throat    ED Discharge Orders    None     Plan discharge  Devoria Albe, MD, Concha Pyo, MD 08/11/18 [phone]

## 2018-08-11 NOTE — Discharge Instructions (Signed)
Drink plenty of fluids.  Call your gastroenterologist office this morning and let them know how you are feeling since she started the Amitiza and see if they feel like you should continue or if you need to stop.

## 2018-08-15 DIAGNOSIS — F411 Generalized anxiety disorder: Secondary | ICD-10-CM | POA: Diagnosis not present

## 2018-09-08 ENCOUNTER — Encounter: Payer: Self-pay | Admitting: Family Medicine

## 2018-09-08 ENCOUNTER — Ambulatory Visit: Payer: BLUE CROSS/BLUE SHIELD | Admitting: Family Medicine

## 2018-09-08 VITALS — BP 100/60 | HR 80 | Temp 98.6°F | Ht 60.0 in | Wt 110.0 lb

## 2018-09-08 DIAGNOSIS — J029 Acute pharyngitis, unspecified: Secondary | ICD-10-CM

## 2018-09-08 DIAGNOSIS — G43009 Migraine without aura, not intractable, without status migrainosus: Secondary | ICD-10-CM

## 2018-09-08 DIAGNOSIS — R29898 Other symptoms and signs involving the musculoskeletal system: Secondary | ICD-10-CM

## 2018-09-08 DIAGNOSIS — F411 Generalized anxiety disorder: Secondary | ICD-10-CM | POA: Diagnosis not present

## 2018-09-08 LAB — RAPID STREP SCREEN (MED CTR MEBANE ONLY): Strep Gp A Ag, IA W/Reflex: NEGATIVE

## 2018-09-08 LAB — CULTURE, GROUP A STREP

## 2018-09-08 MED ORDER — KETOROLAC TROMETHAMINE 60 MG/2ML IM SOLN
60.0000 mg | Freq: Once | INTRAMUSCULAR | Status: AC
  Administered 2018-09-08: 60 mg via INTRAMUSCULAR

## 2018-09-08 NOTE — Patient Instructions (Signed)
Pharyngitis  Pharyngitis is a sore throat (pharynx). This is when there is redness, pain, and swelling in your throat. Most of the time, this condition gets better on its own. In some cases, you may need medicine. Follow these instructions at home:  Take over-the-counter and prescription medicines only as told by your doctor. ? If you were prescribed an antibiotic medicine, take it as told by your doctor. Do not stop taking the antibiotic even if you start to feel better. ? Do not give children aspirin. Aspirin has been linked to Reye syndrome.  Drink enough water and fluids to keep your pee (urine) clear or pale yellow.  Get a lot of rest.  Rinse your mouth (gargle) with a salt-water mixture 3-4 times a day or as needed. To make a salt-water mixture, completely dissolve -1 tsp of salt in 1 cup of warm water.  If your doctor approves, you may use throat lozenges or sprays to soothe your throat. Contact a doctor if:  You have large, tender lumps in your neck.  You have a rash.  You cough up green, yellow-brown, or bloody spit. Get help right away if:  You have a stiff neck.  You drool or cannot swallow liquids.  You cannot drink or take medicines without throwing up.  You have very bad pain that does not go away with medicine.  You have problems breathing, and it is not from a stuffy nose.  You have new pain and swelling in your knees, ankles, wrists, or elbows. Summary  Pharyngitis is a sore throat (pharynx). This is when there is redness, pain, and swelling in your throat.  If you were prescribed an antibiotic medicine, take it as told by your doctor. Do not stop taking the antibiotic even if you start to feel better.  Most of the time, pharyngitis gets better on its own. Sometimes, you may need medicine. This information is not intended to replace advice given to you by your health care provider. Make sure you discuss any questions you have with your health care  provider. Document Released: 02/06/2008 Document Revised: 09/25/2016 Document Reviewed: 09/25/2016 Elsevier Interactive Patient Education  2019 [employer]  

## 2018-09-08 NOTE — Progress Notes (Signed)
Subjective:    Patient ID: Jamie Waters, female    DOB: 1991-04-07, 28 y.o.   MRN: [id number]  Chief Complaint:  Sore throat, headache (x 2 days)   HPI: Jamie Waters is a 28 y.o. female presenting on 09/08/2018 for Sore throat, headache (x 2 days)  Pt presents today with complaints of sore throat, headache, painful swallowing, and increase TMJ pain. Pt states she has not had a fever or chills. States the symptoms started two days ago. She has note tried anything for the symptoms.  Pt had a PHQ9 score of 12, pt is currently seeing Psychiatry for this and has recently had a change in medications. Pt denies SI or HI.   Relevant past medical, surgical, family, and social history reviewed and updated as indicated.  Allergies and medications reviewed and updated.   Past Medical History:  Diagnosis Date  . Anxiety   . Arthritis   . Asthma   . Bipolar II disorder (HCC)   . Depression   . Ectopic fetus   . Ectopic pregnancy   . Fibromyalgia   . IBS (irritable bowel syndrome)   . Miscarriage   . PCOS (polycystic ovarian syndrome) 2015  . PTSD (post-traumatic stress disorder)   . Raynauds syndrome 2015    Past Surgical History:  Procedure Laterality Date  . cyst removed from throat    . DIAGNOSTIC LAPAROSCOPY WITH REMOVAL OF ECTOPIC PREGNANCY    . DIAGNOSTIC LAPAROSCOPY WITH REMOVAL OF ECTOPIC PREGNANCY Left 09/13/2016   Procedure: DIAGNOSTIC LAPAROSCOPY WITH REMOVAL OF ECTOPIC PREGNANCY;  Surgeon: Philip Aspen, DO;  Location: [facility];  Service: Gynecology;  Laterality: Left;  . LAPAROSCOPY N/A 09/11/2015   Procedure: LAPAROSCOPY OPERATIVE, Right Salpingectomy with Removal Ectopic Pregnancy;  Surgeon: Waynard Reeds, MD;  Location: [facility];  Service: Gynecology;  Laterality: N/A;  . THYROIDECTOMY, PARTIAL  2010    Social History   Socioeconomic History  . Marital status: Married    Spouse name: Reuel Boom  . Number of children: Not on file  . Years of education: Not on file    . Highest education level: Not on file  Occupational History  . Not on file  Social Needs  . Financial resource strain: Not hard at all  . Food insecurity:    Worry: Never true    Inability: Never true  . Transportation needs:    Medical: No    Non-medical: No  Tobacco Use  . Smoking status: Never Smoker  . Smokeless tobacco: Never Used  Substance and Sexual Activity  . Alcohol use: No  . Drug use: No  . Sexual activity: Yes    Birth control/protection: None  Lifestyle  . Physical activity:    Days per week: 7 days    Minutes per session: 80 min  . Stress: Very much  Relationships  . Social connections:    Talks on phone: Three times a week    Gets together: Once a week    Attends religious service: Never    Active member of club or organization: No    Attends meetings of clubs or organizations: Never    Relationship status: Married  . Intimate partner violence:    Fear of current or ex partner: No    Emotionally abused: No    Physically abused: No    Forced sexual activity: No  Other Topics Concern  . Not on file  Social History Narrative  . Not on file    Outpatient  Encounter Medications as of 09/08/2018  Medication Sig  . albuterol (PROVENTIL HFA;VENTOLIN HFA) 108 (90 Base) MCG/ACT inhaler Inhale 1-2 puffs into the lungs every 6 (six) hours as needed for wheezing or shortness of breath.  . citalopram (CELEXA) 40 MG tablet Take 40 mg by mouth daily.  . cyclobenzaprine (FLEXERIL) 10 MG tablet Take 1 tablet (10 mg total) by mouth 3 (three) times daily as needed for muscle spasms.  . hydrOXYzine (ATARAX/VISTARIL) 10 MG tablet Take one to two tablets twice daily as needed for anxiety or sleep. Do not exceed 4 tabs a day  . lamoTRIgine (LAMICTAL) 25 MG tablet Take 50 mg by mouth daily.  Marland Kitchen. linaclotide (LINZESS) 145 MCG CAPS capsule Take by mouth.  . propranolol (INDERAL) 10 MG tablet Take 10 mg by mouth 3 (three) times daily.  . [DISCONTINUED] FLUoxetine (PROZAC) 20 MG  capsule Take 1 capsule (20 mg total) by mouth daily.   Facility-Administered Encounter Medications as of 09/08/2018  Medication  . ketorolac (TORADOL) injection 60 mg    Allergies  Allergen Reactions  . Cymbalta [Duloxetine Hcl] Shortness Of Breath  . Norvasc [Amlodipine Besylate] Shortness Of Breath and Other (See Comments)    Reaction:  Tightness in chest     Review of Systems  Constitutional: Positive for appetite change and fatigue. Negative for chills and fever.  HENT: Positive for congestion, sore throat and trouble swallowing. Negative for ear pain, sinus pressure, sinus pain and voice change.   Eyes: Negative for photophobia and visual disturbance.  Respiratory: Positive for cough. Negative for chest tightness and shortness of breath.   Cardiovascular: Negative for chest pain, palpitations and leg swelling.  Gastrointestinal: Negative for constipation, nausea and vomiting.  Neurological: Positive for headaches. Negative for dizziness, tremors, seizures, syncope, facial asymmetry, speech difficulty, weakness, light-headedness and numbness.  Psychiatric/Behavioral: Negative for confusion.  All other systems reviewed and are negative.       Objective:    BP 100/60   Pulse 80   Temp 98.6 F (37 C) (Oral)   Ht 5' (1.524 m)   Wt 110 lb (49.9 kg)   BMI 21.48 kg/m    Wt Readings from Last 3 Encounters:  09/08/18 110 lb (49.9 kg)  08/11/18 108 lb (49 kg)  09/11/17 131 lb (59.4 kg)    Physical Exam Vitals signs and nursing note reviewed.  Constitutional:      General: She is not in acute distress.    Appearance: Normal appearance. She is well-developed, well-groomed and normal weight.  HENT:     Head: Normocephalic and atraumatic.     Jaw: There is normal jaw occlusion. Pain on movement present.     Right Ear: Hearing, tympanic membrane, ear canal and external ear normal.     Left Ear: Hearing, tympanic membrane, ear canal and external ear normal.     Nose: Nose  normal.     Mouth/Throat:     Lips: Pink.     Mouth: Mucous membranes are moist.     Pharynx: Uvula midline. Posterior oropharyngeal erythema present. No pharyngeal swelling, oropharyngeal exudate or uvula swelling.     Tonsils: No tonsillar exudate or tonsillar abscesses.  Eyes:     General: Lids are normal.     Conjunctiva/sclera: Conjunctivae normal.     Pupils: Pupils are equal, round, and reactive to light.  Neck:     Musculoskeletal: Full passive range of motion without pain.     Thyroid: No thyroid mass, thyromegaly or thyroid tenderness.  Cardiovascular:     Rate and Rhythm: Normal rate.     Heart sounds: Normal heart sounds. No murmur. No friction rub. No gallop.   Pulmonary:     Effort: Pulmonary effort is normal.     Breath sounds: Normal breath sounds.  Lymphadenopathy:     Cervical: No cervical adenopathy.  Skin:    General: Skin is warm and dry.     Capillary Refill: Capillary refill takes less than 2 seconds.  Neurological:     General: No focal deficit present.     Mental Status: She is alert and oriented to person, place, and time.  Psychiatric:        Attention and Perception: Attention and perception normal.        Mood and Affect: Mood and affect normal.        Speech: Speech normal.        Behavior: Behavior is cooperative.        Thought Content: Thought content normal. Thought content does not include homicidal or suicidal ideation. Thought content does not include homicidal or suicidal plan.        Cognition and Memory: Cognition and memory normal.        Judgment: Judgment normal.     Results for orders placed or performed during the hospital encounter of 08/11/18  Group A Strep by PCR  Result Value Ref Range   Group A Strep by PCR NOT DETECTED NOT DETECTED    Rapid strep negative.    Pertinent labs & imaging results that were available during my care of the patient were reviewed by me and considered in my medical decision making.  Assessment &  Plan:  Morrie Sheldonshley was seen today for sore throat, headache.  Diagnoses and all orders for this visit:  Viral pharyngitis Increase fluid intake. Tylenol sore throat as needed for pain. Can use throat lozenges or sprays as needed for pain. Warm salt water gargles. Change toothbrush in three days.   Sore throat Rapid strep negative. -     Rapid Strep Screen ([facility] ONLY)  Migraine without aura and without status migrainosus, not intractable Avoid triggers. Can try heat or cold packs to help alleviate pain. Report any new or worsening symptoms.  -     ketorolac (TORADOL) injection 60 mg  TMJ click Continue medications as prescribed. Mouth guard at night, can buy over the counter until you have yours made. Report any new or worsening symptoms.  -     ketorolac (TORADOL) injection 60 mg  Generalized anxiety disorder Pt currently seeing Psychiatry for medication management. Denies SI or HI. Next appointment September 29, 2018.     Continue all other maintenance medications.  Follow up plan: Return if symptoms worsen or fail to improve.  Educational handout given for pharyngitis   The above assessment and management plan was discussed with the patient. The patient verbalized understanding of and has agreed to the management plan. Patient is aware to call the clinic if symptoms persist or worsen. Patient is aware when to return to the clinic for a follow-up visit. Patient educated on when it is appropriate to go to the emergency department.   Kari BaarsMichelle Hawthorne Day, FNP-C [facility] [phone]

## 2018-09-09 ENCOUNTER — Other Ambulatory Visit: Payer: Self-pay | Admitting: Family Medicine

## 2018-09-09 ENCOUNTER — Telehealth: Payer: Self-pay | Admitting: Pediatrics

## 2018-09-09 DIAGNOSIS — B9789 Other viral agents as the cause of diseases classified elsewhere: Principal | ICD-10-CM

## 2018-09-09 DIAGNOSIS — J069 Acute upper respiratory infection, unspecified: Secondary | ICD-10-CM

## 2018-09-09 MED ORDER — PSEUDOEPH-BROMPHEN-DM 30-2-10 MG/5ML PO SYRP: 5.0000 mL | ORAL_SOLUTION | Freq: Four times a day (QID) | ORAL | 0 refills | Status: DC | PRN

## 2018-09-09 NOTE — Telephone Encounter (Signed)
I will send in bromfed, no need for antibiotics as this is viral and will take 7-14 days for improvement.

## 2018-09-09 NOTE — Telephone Encounter (Signed)
Pt's husband aware...

## 2018-09-09 NOTE — Telephone Encounter (Signed)
PT was seen yesterday and spouse has called states that pt is getting worse and wanting to know if something can be sent in Symptoms: Sore Throat, Cough, Stuffy Nose, Headache, Body Ache, No Fever.   [facility]: [facility]

## 2018-10-06 ENCOUNTER — Ambulatory Visit: Payer: BLUE CROSS/BLUE SHIELD | Admitting: Family Medicine

## 2018-10-10 DIAGNOSIS — R5382 Chronic fatigue, unspecified: Secondary | ICD-10-CM | POA: Diagnosis not present

## 2018-10-10 DIAGNOSIS — R768 Other specified abnormal immunological findings in serum: Secondary | ICD-10-CM | POA: Diagnosis not present

## 2018-10-10 DIAGNOSIS — R760 Raised antibody titer: Secondary | ICD-10-CM | POA: Diagnosis not present

## 2018-10-10 DIAGNOSIS — I7300 Raynaud's syndrome without gangrene: Secondary | ICD-10-CM | POA: Diagnosis not present

## 2018-10-10 DIAGNOSIS — M797 Fibromyalgia: Secondary | ICD-10-CM | POA: Diagnosis not present

## 2018-11-21 ENCOUNTER — Ambulatory Visit: Payer: BLUE CROSS/BLUE SHIELD | Admitting: Family Medicine

## 2018-11-21 ENCOUNTER — Encounter: Payer: Self-pay | Admitting: Family Medicine

## 2018-11-21 ENCOUNTER — Other Ambulatory Visit: Payer: Self-pay

## 2018-11-21 VITALS — BP 133/77 | HR 98 | Temp 99.6°F | Ht 60.0 in | Wt 110.0 lb

## 2018-11-21 DIAGNOSIS — R110 Nausea: Secondary | ICD-10-CM

## 2018-11-21 DIAGNOSIS — N926 Irregular menstruation, unspecified: Secondary | ICD-10-CM

## 2018-11-21 DIAGNOSIS — R509 Fever, unspecified: Secondary | ICD-10-CM

## 2018-11-21 DIAGNOSIS — H6592 Unspecified nonsuppurative otitis media, left ear: Secondary | ICD-10-CM

## 2018-11-21 LAB — VERITOR FLU A/B WAIVED
Influenza A: NEGATIVE
Influenza B: NEGATIVE

## 2018-11-21 LAB — PREGNANCY, URINE: Preg Test, Ur: NEGATIVE

## 2018-11-21 MED ORDER — ONDANSETRON HCL 4 MG PO TABS: 4.0000 mg | ORAL_TABLET | Freq: Three times a day (TID) | ORAL | 0 refills | Status: DC | PRN

## 2018-11-21 MED ORDER — CETIRIZINE HCL 10 MG PO TABS: 10.0000 mg | ORAL_TABLET | Freq: Every day | ORAL | 11 refills | Status: DC

## 2018-11-21 MED ORDER — FLUTICASONE PROPIONATE 50 MCG/ACT NA SUSP: 2.0000 | Freq: Every day | NASAL | 6 refills | Status: DC

## 2018-11-21 NOTE — Patient Instructions (Signed)
Otitis Media With Effusion    Otitis media with effusion (OME) occurs when there is inflammation of the middle ear and fluid in the middle ear space. There are no signs and symptoms of infection. The middle ear space contains air and the bones for hearing. Air in the middle ear space helps to transmit sound to the brain.  OME is a common condition in children, and it often occurs after an ear infection. This condition may be present for several weeks or longer after an ear infection. Most cases of this condition get better on their own.  What are the causes?  OME is caused by a blockage of the eustachian tube in one or both ears. These tubes drain fluid in the ears to the back of the nose (nasopharynx). If the tissue in the tube swells up (edema), the tube closes. This prevents fluid from draining. Blockage can be caused by:   Ear infections.   Colds and other upper respiratory infections.   Allergies.   Irritants, such as tobacco smoke.   Enlarged adenoids. The adenoids are areas of soft tissue located high in the back of the throat, behind the nose and the roof of the mouth. They are part of the body's natural defense (immune) system.   A mass in the nasopharynx.   Damage to the ear caused by pressure changes (barotrauma).  What increases the risk?  Your child is more likely to develop this condition if:   He or she has repeated ear and sinus infections.   He or she has allergies.   He or she is exposed to tobacco smoke.   He or she attends daycare.   He or she is not breastfed.  What are the signs or symptoms?  Symptoms of this condition may not be obvious. Sometimes this condition does not have any symptoms, or symptoms may overlap with those of a cold or upper respiratory tract illness.  Symptoms of this condition include:   Temporary hearing loss.   A feeling of fullness in the ear without pain.   Irritability or agitation.   Balance (vestibular) problems.  As a result of hearing loss, your  child may:   Listen to the TV at a loud volume.   Not respond to questions.   Ask "What?" often when spoken to.   Mistake or confuse one sound or word for another.   Perform poorly at school.   Have a poor attention span.   Become agitated or irritated easily.  How is this diagnosed?  This condition is diagnosed with an ear exam. Your child's health care provider will look inside your child's ear with an instrument (otoscope) to check for redness, swelling, and fluid.  Other tests may be done, including:   A test to check the movement of the eardrum (pneumatic otoscopy). This is done by squeezing a small amount of air into the ear.   A test that changes air pressure in the middle ear to check how well the eardrum moves and to see if the eustachian tube is working (tympanogram).   Hearing test (audiogram). This test involves playing tones at different pitches to see if your child can hear each tone.  How is this treated?  Treatment for this condition depends on the cause. In many cases, the fluid goes away on its own.  In some cases, your child may need a procedure to create a hole in the eardrum to allow fluid to drain (myringotomy) and to insert   small drainage tubes (tympanostomy tubes) into the eardrums. These tubes help to drain fluid and prevent infection. This procedure may be recommended if:   OME does not get better over several months.   Your child has many ear infections within several months.   Your child has noticeable hearing loss.   Your child has problems with speech and language development.  Surgery may also be done to remove the adenoids (adenoidectomy).  Follow these instructions at home:   Give over-the-counter and prescription medicines only as told by your child's health care provider.   Keep children away from any tobacco smoke.   Keep all follow-up visits as told by your child's health care provider. This is important.  How is this prevented?   Keep your child's vaccinations  up to date. Make sure your child gets all recommended vaccinations, including a pneumonia and flu vaccine.   Encourage hand washing. Your child should wash his or her hands often with soap and water. If there is no soap and water, he or she should use hand sanitizer.   Avoid exposing your child to tobacco smoke.   Breastfeed your baby, if possible. Babies who are breastfed as long as possible are less likely to develop this condition.  Contact a health care provider if:   Your child's hearing does not get better after 3 months.   Your child's hearing is worse.   Your child has ear pain.   Your child has a fever.   Your child has drainage from the ear.   Your child is dizzy.   Your child has a lump on his or her neck.  Get help right away if:   Your child has bleeding from the nose.   Your child cannot move part of her or his face.   Your child has trouble breathing.   Your child cannot smell.   Your child develops severe congestion.   Your child develops weakness.   Your child who is younger than 3 months has a temperature of 100F (38C) or higher.  Summary   Otitis media with effusion (OME) occurs when there is inflammation of the middle ear and fluid in the middle ear space.   This condition is caused by blockage of one or both eustachian tubes, which drain fluid in the ears to the back of the nose.   Symptoms of this condition can include temporary hearing loss, a feeling of fullness in the ear, irritability or agitation, and balance (vestibular) problems. Sometimes, there are no symptoms.   This condition is diagnosed with an ear exam and tests, such as pneumatic otoscopy, tympanogram, and audiogram.   Treatment for this condition depends on the cause. In many cases, the fluid goes away on its own.  This information is not intended to replace advice given to you by your health care provider. Make sure you discuss any questions you have with your health care provider.  Document Released:  11/10/2003 Document Revised: 07/12/2016 Document Reviewed: 07/12/2016  [employer] Interactive Patient Education  2019 [employer]

## 2018-11-21 NOTE — Progress Notes (Signed)
Subjective:  Patient ID: Jamie Waters, female    DOB: [birth year], 28 y.o.   MRN: [id number]  Chief Complaint:  Fever, headache, nausea (x 2 days)   HPI: Jamie Waters is a 28 y.o. female presenting on 11/21/2018 for Fever, headache, nausea (x 2 days)   1. Fever, unspecified fever cause  Pt presents today with complaints of low grade fever for the past 2 days. States she has not had a cough, sore throat, otalgia, or congestion. She denies all URI symptoms. No myalgias. She states she has has a slight headache and nausea with the pain.  No recent travel or exposure to known sick contacts.    2. Late menses  LMP 10/04/2018. Pt states she does not feel she is pregnant due to a history of bilaterall salpingectomy. States she is concerned because her menses is so late and she is having the nausea and low grade fever. Denies abdominal pain, flank pain, or breast changes.       Relevant past medical, surgical, family, and social history reviewed and updated as indicated.  Allergies and medications reviewed and updated.   Past Medical History:  Diagnosis Date  . Anxiety   . Arthritis   . Asthma   . Bipolar II disorder (HCC)   . Depression   . Ectopic fetus   . Ectopic pregnancy   . Fibromyalgia   . IBS (irritable bowel syndrome)   . Miscarriage   . PCOS (polycystic ovarian syndrome) 2015  . PTSD (post-traumatic stress disorder)   . Raynauds syndrome 2015    Past Surgical History:  Procedure Laterality Date  . cyst removed from throat    . DIAGNOSTIC LAPAROSCOPY WITH REMOVAL OF ECTOPIC PREGNANCY    . DIAGNOSTIC LAPAROSCOPY WITH REMOVAL OF ECTOPIC PREGNANCY Left 09/13/2016   Procedure: DIAGNOSTIC LAPAROSCOPY WITH REMOVAL OF ECTOPIC PREGNANCY;  Surgeon: Philip Aspen, DO;  Location: [facility];  Service: Gynecology;  Laterality: Left;  . LAPAROSCOPY N/A 09/11/2015   Procedure: LAPAROSCOPY OPERATIVE, Right Salpingectomy with Removal Ectopic Pregnancy;  Surgeon: Waynard Reeds, MD;   Location: [facility];  Service: Gynecology;  Laterality: N/A;  . THYROIDECTOMY, PARTIAL  2010    Social History   Socioeconomic History  . Marital status: Married    Spouse name: Reuel Boom  . Number of children: Not on file  . Years of education: Not on file  . Highest education level: Not on file  Occupational History  . Not on file  Social Needs  . Financial resource strain: Not hard at all  . Food insecurity:    Worry: Never true    Inability: Never true  . Transportation needs:    Medical: No    Non-medical: No  Tobacco Use  . Smoking status: Never Smoker  . Smokeless tobacco: Never Used  Substance and Sexual Activity  . Alcohol use: No  . Drug use: No  . Sexual activity: Yes    Birth control/protection: None  Lifestyle  . Physical activity:    Days per week: 7 days    Minutes per session: 80 min  . Stress: Very much  Relationships  . Social connections:    Talks on phone: Three times a week    Gets together: Once a week    Attends religious service: Never    Active member of club or organization: No    Attends meetings of clubs or organizations: Never    Relationship status: Married  . Intimate partner violence:  Fear of current or ex partner: No    Emotionally abused: No    Physically abused: No    Forced sexual activity: No  Other Topics Concern  . Not on file  Social History Narrative  . Not on file    Outpatient Encounter Medications as of 11/21/2018  Medication Sig  . albuterol (PROVENTIL HFA;VENTOLIN HFA) 108 (90 Base) MCG/ACT inhaler Inhale 1-2 puffs into the lungs every 6 (six) hours as needed for wheezing or shortness of breath.  . citalopram (CELEXA) 40 MG tablet Take 40 mg by mouth daily.  . hydrOXYzine (ATARAX/VISTARIL) 10 MG tablet Take one to two tablets twice daily as needed for anxiety or sleep. Do not exceed 4 tabs a day  . lamoTRIgine (LAMICTAL) 25 MG tablet Take 50 mg by mouth daily.  Marland Kitchen linaclotide (LINZESS) 145 MCG CAPS capsule Take by  mouth.  . propranolol (INDERAL) 10 MG tablet Take 10 mg by mouth 3 (three) times daily.  . cetirizine (ZYRTEC) 10 MG tablet Take 1 tablet (10 mg total) by mouth daily.  . fluticasone (FLONASE) 50 MCG/ACT nasal spray Place 2 sprays into both nostrils daily.  . ondansetron (ZOFRAN) 4 MG tablet Take 1 tablet (4 mg total) by mouth every 8 (eight) hours as needed for nausea or vomiting.  . [DISCONTINUED] brompheniramine-pseudoephedrine-DM 30-2-10 MG/5ML syrup Take 5 mLs by mouth 4 (four) times daily as needed.  . [DISCONTINUED] cyclobenzaprine (FLEXERIL) 10 MG tablet Take 1 tablet (10 mg total) by mouth 3 (three) times daily as needed for muscle spasms.   No facility-administered encounter medications on file as of 11/21/2018.     Allergies  Allergen Reactions  . Cymbalta [Duloxetine Hcl] Shortness Of Breath  . Norvasc [Amlodipine Besylate] Shortness Of Breath and Other (See Comments)    Reaction:  Tightness in chest     Review of Systems  Constitutional: Positive for fever. Negative for activity change, appetite change, chills, fatigue and unexpected weight change.  HENT: Negative for congestion, ear pain, rhinorrhea, sinus pressure, sinus pain, sneezing and sore throat.   Eyes: Negative for photophobia and visual disturbance.  Respiratory: Negative for cough, choking, chest tightness, shortness of breath and wheezing.   Cardiovascular: Negative for chest pain, palpitations and leg swelling.  Gastrointestinal: Positive for nausea. Negative for abdominal distention, abdominal pain, constipation, diarrhea and vomiting.  Genitourinary: Positive for menstrual problem. Negative for decreased urine volume, difficulty urinating, dyspareunia, vaginal bleeding, vaginal discharge and vaginal pain.  Musculoskeletal: Negative for arthralgias and myalgias.  Neurological: Positive for light-headedness and headaches. Negative for dizziness, tremors, seizures, syncope, facial asymmetry, speech difficulty,  weakness and numbness.  Psychiatric/Behavioral: Negative for confusion.  All other systems reviewed and are negative.       Objective:  BP 133/77   Pulse 98   Temp 99.6 F (37.6 C) (Oral)   Ht 5' (1.524 m)   Wt 110 lb (49.9 kg)   BMI 21.48 kg/m    Wt Readings from Last 3 Encounters:  11/21/18 110 lb (49.9 kg)  09/08/18 110 lb (49.9 kg)  08/11/18 108 lb (49 kg)    Physical Exam Vitals signs and nursing note reviewed.  Constitutional:      General: She is not in acute distress.    Appearance: Normal appearance. She is well-developed and well-groomed. She is not ill-appearing or toxic-appearing.  HENT:     Head: Normocephalic and atraumatic.     Jaw: There is normal jaw occlusion.     Right Ear: Hearing, tympanic membrane,  ear canal and external ear normal. No middle ear effusion. Tympanic membrane is not perforated or erythematous.     Left Ear: Hearing, ear canal and external ear normal. A middle ear effusion is present. Tympanic membrane is not perforated or erythematous.     Nose: Nose normal.     Mouth/Throat:     Lips: Pink.     Mouth: Mucous membranes are moist.     Pharynx: Oropharynx is clear. Uvula midline. No pharyngeal swelling, oropharyngeal exudate, posterior oropharyngeal erythema or uvula swelling.     Tonsils: No tonsillar exudate or tonsillar abscesses.  Eyes:     General: Lids are normal.     Extraocular Movements: Extraocular movements intact.     Conjunctiva/sclera: Conjunctivae normal.     Pupils: Pupils are equal, round, and reactive to light.  Neck:     Musculoskeletal: Full passive range of motion without pain and neck supple.     Trachea: Trachea and phonation normal.  Cardiovascular:     Rate and Rhythm: Normal rate and regular rhythm.     Heart sounds: Normal heart sounds. No murmur. No friction rub. No gallop.   Pulmonary:     Effort: Pulmonary effort is normal. No respiratory distress.     Breath sounds: Normal breath sounds.  Abdominal:      General: Abdomen is flat. Bowel sounds are normal. There is no distension.     Palpations: Abdomen is soft.     Tenderness: There is no abdominal tenderness. There is no right CVA tenderness or left CVA tenderness.  Lymphadenopathy:     Cervical: No cervical adenopathy.  Skin:    General: Skin is warm and dry.     Capillary Refill: Capillary refill takes less than 2 seconds.  Neurological:     General: No focal deficit present.     Mental Status: She is alert and oriented to person, place, and time.     Cranial Nerves: No cranial nerve deficit.     Sensory: No sensory deficit.     Motor: No weakness.     Coordination: Coordination normal.     Gait: Gait normal.     Deep Tendon Reflexes: Reflexes normal.  Psychiatric:        Mood and Affect: Mood normal.        Behavior: Behavior normal. Behavior is cooperative.        Thought Content: Thought content normal.     Results for orders placed or performed in visit on 09/08/18  Rapid Strep Screen ([facility] ONLY)  Result Value Ref Range   Strep Gp A Ag, IA W/Reflex Negative Negative  Culture, Group A Strep  Result Value Ref Range   Strep A Culture CANCELED        Pertinent labs & imaging results that were available during my care of the patient were reviewed by me and considered in my medical decision making.  Assessment & Plan:  Teresita was seen today for fever, headache, nausea.  Diagnoses and all orders for this visit:  Fever, unspecified fever cause Influenza test negative.  -     Veritor Flu A/B Waived  Late menses Urine pregnancy test negative.  -     Pregnancy, urine  Left otitis media with effusion Will trial below. Avoid cigarette smoke. Report any new or worsening symptoms.  -     cetirizine (ZYRTEC) 10 MG tablet; Take 1 tablet (10 mg total) by mouth daily. -     fluticasone (FLONASE)  50 MCG/ACT nasal spray; Place 2 sprays into both nostrils daily.  Nausea Adequate hydration discussed. Medications  as prescribed. Report any new or worsening symptoms.  -     ondansetron (ZOFRAN) 4 MG tablet; Take 1 tablet (4 mg total) by mouth every 8 (eight) hours as needed for nausea or vomiting.     Continue all other maintenance medications.  Follow up plan: Return if symptoms worsen or fail to improve.  Educational handout given for otitis media with effusion   The above assessment and management plan was discussed with the patient. The patient verbalized understanding of and has agreed to the management plan. Patient is aware to call the clinic if symptoms persist or worsen. Patient is aware when to return to the clinic for a follow-up visit. Patient educated on when it is appropriate to go to the emergency department.   Kari BaarsMichelle Chelise Hanger, FNP-C [facility] [phone]

## 2019-02-25 DIAGNOSIS — Z31430 Encounter of female for testing for genetic disease carrier status for procreative management: Secondary | ICD-10-CM | POA: Diagnosis not present

## 2019-02-25 DIAGNOSIS — Z319 Encounter for procreative management, unspecified: Secondary | ICD-10-CM | POA: Diagnosis not present

## 2019-02-25 DIAGNOSIS — N971 Female infertility of tubal origin: Secondary | ICD-10-CM | POA: Diagnosis not present

## 2019-03-04 DIAGNOSIS — F411 Generalized anxiety disorder: Secondary | ICD-10-CM | POA: Diagnosis not present

## 2019-06-15 ENCOUNTER — Ambulatory Visit (INDEPENDENT_AMBULATORY_CARE_PROVIDER_SITE_OTHER): Payer: BLUE CROSS/BLUE SHIELD | Admitting: Family Medicine

## 2019-06-15 DIAGNOSIS — F411 Generalized anxiety disorder: Secondary | ICD-10-CM

## 2019-06-15 DIAGNOSIS — R110 Nausea: Secondary | ICD-10-CM

## 2019-06-15 MED ORDER — PROMETHAZINE HCL 25 MG PO TABS: 25.0000 mg | ORAL_TABLET | Freq: Four times a day (QID) | ORAL | 2 refills | Status: DC | PRN

## 2019-06-15 MED ORDER — LAMOTRIGINE 100 MG PO TABS: 100.0000 mg | ORAL_TABLET | Freq: Every day | ORAL | 2 refills | Status: DC

## 2019-06-15 MED ORDER — CITALOPRAM HYDROBROMIDE 40 MG PO TABS: 40.0000 mg | ORAL_TABLET | Freq: Every day | ORAL | 2 refills | Status: DC

## 2019-06-15 NOTE — Progress Notes (Signed)
Subjective:    Patient ID: Jamie Waters, female    DOB: 07-30-1991, 28 y.o.   MRN: [id number]   HPI: Jamie Waters is a 28 y.o. female presenting for 10 days having nausea and dizziness. Dizziness constant. Feels like she is going in slow motion. She is also nauseated intermittently. Denies fever, chills and sweats. Sleep schedule is all over the place due to recent job change. DCed her citalopram, lamictal and hydroxyzine all a the same time due to loss of her insurance.     [facility] past medical, surgical, family and social history reviewed and updated as indicated.  Interim medical history since our last visit reviewed. Allergies and medications reviewed and updated.  ROS:  Review of Systems   Social History   Tobacco Use  Smoking Status Never Smoker  Smokeless Tobacco Never Used       Objective:     Wt Readings from Last 3 Encounters:  11/21/18 110 lb (49.9 kg)  09/08/18 110 lb (49.9 kg)  08/11/18 108 lb (49 kg)     Exam deferred. Pt. Harboring due to COVID 19. Phone visit performed.   Assessment & Plan:   1. Nausea   2. Generalized anxiety disorder     Meds ordered this encounter  Medications  . lamoTRIgine (LAMICTAL) 100 MG tablet    Sig: Take 1 tablet (100 mg total) by mouth daily.    Dispense:  30 tablet    Refill:  2  . citalopram (CELEXA) 40 MG tablet    Sig: Take 1 tablet (40 mg total) by mouth daily.    Dispense:  30 tablet    Refill:  2  . promethazine (PHENERGAN) 25 MG tablet    Sig: Take 1 tablet (25 mg total) by mouth every 6 (six) hours as needed for nausea or vomiting.    Dispense:  30 tablet    Refill:  2    I advised patient that sudden discontinuation of these medicines is prone to causing some side effects.  Initially we decided to put her back on them for a taper using just enough medicine so she can afford it.  However at her request I checked the [facility] $4 list and both medicines are covered under this plan.  This should make  it affordable in spite of her lack of insurance.  New prescriptions were sent into [facility] for each.    Diagnoses and all orders for this visit:  Nausea  Generalized anxiety disorder  Other orders -     lamoTRIgine (LAMICTAL) 100 MG tablet; Take 1 tablet (100 mg total) by mouth daily. -     citalopram (CELEXA) 40 MG tablet; Take 1 tablet (40 mg total) by mouth daily. -     promethazine (PHENERGAN) 25 MG tablet; Take 1 tablet (25 mg total) by mouth every 6 (six) hours as needed for nausea or vomiting.    Virtual Visit via telephone Note  I discussed the limitations, risks, security and privacy concerns of performing an evaluation and management service by telephone and the availability of in person appointments. The patient was identified with two identifiers. Pt.expressed understanding and agreed to proceed. Pt. Is at home. Dr. Livia Snellen is in his office.  Follow Up Instructions:   I discussed the assessment and treatment plan with the patient. The patient was provided an opportunity to ask questions and all were answered. The patient agreed with the plan and demonstrated an understanding of the instructions.   The  patient was advised to call back or seek an in-person evaluation if the symptoms worsen or if the condition fails to improve as anticipated.   Total minutes including chart review and phone contact time: 24   Follow up plan: No follow-ups on file.  Mechele Claude, MD [facility]

## 2019-06-16 ENCOUNTER — Encounter: Payer: Self-pay | Admitting: Family Medicine

## 2019-12-14 ENCOUNTER — Other Ambulatory Visit: Payer: Self-pay | Admitting: Family Medicine

## 2019-12-15 ENCOUNTER — Encounter (HOSPITAL_COMMUNITY): Payer: Self-pay

## 2019-12-15 ENCOUNTER — Other Ambulatory Visit: Payer: Self-pay

## 2019-12-15 ENCOUNTER — Emergency Department (HOSPITAL_COMMUNITY)
Admission: EM | Admit: 2019-12-15 | Discharge: 2019-12-15 | Disposition: A | Discharge status: Home / Self Care | Payer: Self-pay | Attending: Emergency Medicine | Admitting: Emergency Medicine

## 2019-12-15 DIAGNOSIS — Z79899 Other long term (current) drug therapy: Secondary | ICD-10-CM | POA: Insufficient documentation

## 2019-12-15 DIAGNOSIS — A084 Viral intestinal infection, unspecified: Secondary | ICD-10-CM | POA: Insufficient documentation

## 2019-12-15 DIAGNOSIS — Z20822 Contact with and (suspected) exposure to covid-19: Secondary | ICD-10-CM | POA: Insufficient documentation

## 2019-12-15 DIAGNOSIS — R519 Headache, unspecified: Secondary | ICD-10-CM | POA: Insufficient documentation

## 2019-12-15 DIAGNOSIS — F319 Bipolar disorder, unspecified: Secondary | ICD-10-CM | POA: Insufficient documentation

## 2019-12-15 DIAGNOSIS — M7918 Myalgia, other site: Secondary | ICD-10-CM | POA: Insufficient documentation

## 2019-12-15 DIAGNOSIS — R1084 Generalized abdominal pain: Secondary | ICD-10-CM | POA: Insufficient documentation

## 2019-12-15 LAB — URINALYSIS, ROUTINE W REFLEX MICROSCOPIC
Bilirubin Urine: NEGATIVE
Glucose, UA: NEGATIVE mg/dL
Hgb urine dipstick: NEGATIVE
Ketones, ur: 20 mg/dL — AB
Nitrite: NEGATIVE
Protein, ur: 30 mg/dL — AB
Specific Gravity, Urine: 1.028 (ref 1.005–1.030)
pH: 6 (ref 5.0–8.0)

## 2019-12-15 LAB — CBC
HCT: 44.8 % (ref 36.0–46.0)
Hemoglobin: 14.3 g/dL (ref 12.0–15.0)
MCH: 30.6 pg (ref 26.0–34.0)
MCHC: 31.9 g/dL (ref 30.0–36.0)
MCV: 95.7 fL (ref 80.0–100.0)
Platelets: 299 10*3/uL (ref 150–400)
RBC: 4.68 MIL/uL (ref 3.87–5.11)
RDW: 12.8 % (ref 11.5–15.5)
WBC: 7.2 10*3/uL (ref 4.0–10.5)
nRBC: 0 % (ref 0.0–0.2)

## 2019-12-15 LAB — COMPREHENSIVE METABOLIC PANEL
ALT: 18 U/L (ref 0–44)
AST: 18 U/L (ref 15–41)
Albumin: 4 g/dL (ref 3.5–5.0)
Alkaline Phosphatase: 49 U/L (ref 38–126)
Anion gap: 9 (ref 5–15)
BUN: 10 mg/dL (ref 6–20)
CO2: 25 mmol/L (ref 22–32)
Calcium: 9.1 mg/dL (ref 8.9–10.3)
Chloride: 104 mmol/L (ref 98–111)
Creatinine, Ser: 0.57 mg/dL (ref 0.44–1.00)
GFR calc Af Amer: >60 mL/min (ref >60)
GFR calc non Af Amer: >60 mL/min (ref >60)
Glucose, Bld: 104 mg/dL — ABNORMAL HIGH (ref 70–99)
Potassium: 3.6 mmol/L (ref 3.5–5.1)
Sodium: 138 mmol/L (ref 135–145)
Total Bilirubin: 0.7 mg/dL (ref 0.3–1.2)
Total Protein: 7 g/dL (ref 6.5–8.1)

## 2019-12-15 LAB — I-STAT BETA HCG BLOOD, ED (MC, WL, AP ONLY): I-stat hCG, quantitative: <5 m[IU]/mL (ref <5)

## 2019-12-15 LAB — LIPASE, BLOOD: Lipase: 23 U/L (ref 11–51)

## 2019-12-15 LAB — POC SARS CORONAVIRUS 2 AG -  ED: SARS Coronavirus 2 Ag: NEGATIVE

## 2019-12-15 LAB — SARS CORONAVIRUS 2 (TAT 6-24 HRS): SARS Coronavirus 2: NEGATIVE

## 2019-12-15 MED ORDER — LACTATED RINGERS IV BOLUS
1000.0000 mL | Freq: Once | INTRAVENOUS | Status: AC
  Administered 2019-12-15: 15:00:00 1000 mL via INTRAVENOUS

## 2019-12-15 MED ORDER — KETOROLAC TROMETHAMINE 15 MG/ML IJ SOLN
15.0000 mg | Freq: Once | INTRAMUSCULAR | Status: AC
  Administered 2019-12-15: 15 mg via INTRAVENOUS
  Filled 2019-12-15: qty 1

## 2019-12-15 MED ORDER — SODIUM CHLORIDE 0.9% FLUSH: 3.0000 mL | Freq: Once | INTRAVENOUS | Status: DC

## 2019-12-15 MED ORDER — ONDANSETRON HCL 4 MG/2ML IJ SOLN
4.0000 mg | Freq: Once | INTRAMUSCULAR | Status: AC
  Administered 2019-12-15: 4 mg via INTRAVENOUS
  Filled 2019-12-15: qty 2

## 2019-12-15 MED ORDER — ONDANSETRON 4 MG PO TBDP: 4.0000 mg | ORAL_TABLET | Freq: Three times a day (TID) | ORAL | 0 refills | Status: DC | PRN

## 2019-12-15 NOTE — ED Triage Notes (Signed)
Pt presents w/abd pain, headache, body aches, N/V starting last night at 7pm

## 2019-12-15 NOTE — ED Provider Notes (Signed)
[facility] EMERGENCY DEPARTMENT Provider Note   CSN: [id number] Arrival date & time: 12/15/19  1117     History Chief Complaint  Patient presents with  . Abdominal Pain  . Nausea  . Emesis    Jamie Waters is a 29 y.o. female.  HPI 29 year old female presents with vomiting and diarrhea.  She is also having other symptoms such as abdominal cramping, headache, and body aches.  Has felt hot and cold but has not noticed an actual fever.  All of this started last evening.  Her nephew had vomiting and diarrhea but it was never as bad as hers.  No cough or shortness of breath.  No blood in her emesis or diarrhea.  She has not urinated today.  Feels achy at this time. Vomiting stopped but is still dry-heaving.   Past Medical History:  Diagnosis Date  . Anxiety   . Arthritis   . Asthma   . Bipolar II disorder ([place])   . Depression   . Ectopic fetus   . Ectopic pregnancy   . Fibromyalgia   . IBS (irritable bowel syndrome)   . Miscarriage   . PCOS (polycystic ovarian syndrome) 2015  . PTSD (post-traumatic stress disorder)   . Raynauds syndrome 2015    Patient Active Problem List   Diagnosis Date Noted  . Connective tissue disorder ([place]) 01/17/2017  . Fibromyalgia 11/05/2016  . TMJ click 23/53/6144  . Chronic bilateral low back pain without sciatica 11/05/2016  . Generalized anxiety disorder 11/05/2016  . Undifferentiated connective tissue disease ([place]) 11/05/2016  . History of multiple miscarriages 11/05/2016  . Migraine without status migrainosus, not intractable 11/05/2016  . Ectopic pregnancy, tubal 09/11/2015  . Ectopic pregnancy 10/30/2014    Past Surgical History:  Procedure Laterality Date  . cyst removed from throat    . DIAGNOSTIC LAPAROSCOPY WITH REMOVAL OF ECTOPIC PREGNANCY    . DIAGNOSTIC LAPAROSCOPY WITH REMOVAL OF ECTOPIC PREGNANCY Left 09/13/2016   Procedure: DIAGNOSTIC LAPAROSCOPY WITH REMOVAL OF ECTOPIC PREGNANCY;  Surgeon: Allyn Kenner, DO;  Location: [facility];  Service: Gynecology;  Laterality: Left;  . LAPAROSCOPY N/A 09/11/2015   Procedure: LAPAROSCOPY OPERATIVE, Right Salpingectomy with Removal Ectopic Pregnancy;  Surgeon: Vanessa Kick, MD;  Location: [facility];  Service: Gynecology;  Laterality: N/A;  . THYROIDECTOMY, PARTIAL  2010     OB History    Gravida  4   Para      Term      Preterm      AB  4   Living  0     SAB  1   TAB      Ectopic  3   Multiple  0   Live Births  0           Family History  Problem Relation Age of Onset  . Dementia Father   . Alcohol abuse Father   . Bipolar disorder Sister   . Autism spectrum disorder Sister   . Lupus Maternal Grandmother   . Cancer Maternal Grandmother   . Cancer Paternal Grandfather     Social History   Tobacco Use  . Smoking status: Never Smoker  . Smokeless tobacco: Never Used  Substance Use Topics  . Alcohol use: No  . Drug use: No    Home Medications Prior to Admission medications   Medication Sig Start Date End Date Taking? Authorizing Provider  albuterol (PROVENTIL HFA;VENTOLIN HFA) 108 (90 Base) MCG/ACT inhaler Inhale 1-2 puffs into the lungs  every 6 (six) hours as needed for wheezing or shortness of breath.   Yes [provider]  citalopram (CELEXA) 40 MG tablet Take 1 tablet (40 mg total) by mouth daily. 06/15/19  Yes Mechele Claude, MD  hydrOXYzine (ATARAX/VISTARIL) 10 MG tablet Take one to two tablets twice daily as needed for anxiety or sleep. Do not exceed 4 tabs a day Patient taking differently: Take 10-20 mg by mouth 2 (two) times daily as needed for anxiety. Take one to two tablets twice daily as needed for anxiety or sleep. Do not exceed 4 tabs a day 03/20/18  Yes Eksir, Bo Mcclintock, MD  lamoTRIgine (LAMICTAL) 100 MG tablet Take 1 tablet (100 mg total) by mouth daily. 06/15/19  Yes Stacks, Broadus John, MD  linaclotide (LINZESS) 290 MCG CAPS capsule Take 290 mcg by mouth daily before breakfast.   Yes [provider]  polyethylene glycol powder (GLYCOLAX/MIRALAX) 17 GM/SCOOP powder Take 1 Container by mouth daily as needed for mild constipation.   Yes [provider]  promethazine (PHENERGAN) 25 MG tablet Take 1 tablet (25 mg total) by mouth every 6 (six) hours as needed for nausea or vomiting. 06/15/19  Yes Mechele Claude, MD  cetirizine (ZYRTEC) 10 MG tablet Take 1 tablet (10 mg total) by mouth daily. Patient not taking: Reported on 12/15/2019 11/21/18   Sonny Masters, FNP  fluticasone [facility]) 50 MCG/ACT nasal spray Place 2 sprays into both nostrils daily. Patient not taking: Reported on 12/15/2019 11/21/18   Sonny Masters, FNP  ondansetron (ZOFRAN ODT) 4 MG disintegrating tablet Take 1 tablet (4 mg total) by mouth every 8 (eight) hours as needed for nausea or vomiting. 12/15/19   Pricilla Loveless, MD    Allergies    Cymbalta [duloxetine hcl] and Norvasc [amlodipine besylate]  Review of Systems   Review of Systems  Constitutional: Positive for chills. Negative for fever.  Gastrointestinal: Positive for abdominal pain, diarrhea, nausea and vomiting. Negative for blood in stool.  Genitourinary: Negative for dysuria.  Musculoskeletal: Positive for myalgias.  Neurological: Positive for headaches.  All other systems reviewed and are negative.   Physical Exam Updated Vital Signs BP 106/65 (BP Location: Left Arm)   Pulse 80   Temp 98.6 F (37 C) (Oral)   Resp 17   Ht 5' (1.524 m)   Wt 59 kg   SpO2 98%   BMI 25.39 kg/m   Physical Exam Vitals and nursing note reviewed.  Constitutional:      General: She is not in acute distress.    Appearance: She is well-developed. She is not ill-appearing or diaphoretic.  HENT:     Head: Normocephalic and atraumatic.     Right Ear: External ear normal.     Left Ear: External ear normal.     Nose: Nose normal.  Eyes:     General:        Right eye: No discharge.        Left eye: No discharge.  Cardiovascular:     Rate and Rhythm:  Normal rate and regular rhythm.     Heart sounds: Normal heart sounds.  Pulmonary:     Effort: Pulmonary effort is normal.     Breath sounds: Normal breath sounds.  Abdominal:     Palpations: Abdomen is soft.     Tenderness: There is generalized abdominal tenderness (mild, generalized and non-focal. no rebound/guarding).  Skin:    General: Skin is warm and dry.  Neurological:     Mental Status:  She is alert.  Psychiatric:        Mood and Affect: Mood is not anxious.     ED Results / Procedures / Treatments   Labs (all labs ordered are listed, but only abnormal results are displayed) Labs Reviewed  COMPREHENSIVE METABOLIC PANEL - Abnormal; Notable for the following components:      Result Value   Glucose, Bld 104 (*)    All other components within normal limits  URINALYSIS, ROUTINE W REFLEX MICROSCOPIC - Abnormal; Notable for the following components:   APPearance HAZY (*)    Ketones, ur 20 (*)    Protein, ur 30 (*)    Leukocytes,Ua SMALL (*)    Bacteria, UA RARE (*)    All other components within normal limits  SARS CORONAVIRUS 2 (TAT 6-24 HRS)  LIPASE, BLOOD  CBC  I-STAT BETA HCG BLOOD, ED ([facility], [facility], [facility] ONLY)  POC SARS CORONAVIRUS 2 AG -  ED    EKG None  Radiology No results found.  Procedures Procedures (including critical care time)  Medications Ordered in ED Medications  lactated ringers bolus 1,000 mL (0 mLs Intravenous Stopped 12/15/19 1717)  lactated ringers bolus 1,000 mL (0 mLs Intravenous Stopped 12/15/19 1718)  ondansetron (ZOFRAN) injection 4 mg (4 mg Intravenous Given 12/15/19 1456)  ketorolac (TORADOL) 15 MG/ML injection 15 mg (15 mg Intravenous Given 12/15/19 1456)    ED Course  I have reviewed the triage vital signs and the nursing notes.  Pertinent labs & imaging results that were available during my care of the patient were reviewed by me and considered in my medical decision making (see chart for details).    MDM Rules/Calculators/A&P                       Patient symptoms most consistent with viral gastroenteritis.  Her abdominal exam is benign.  She was given IV fluids for dehydration.  Labs have been reviewed and do show some dehydration with some ketones in urine.  Otherwise, WBC, electrolytes, are unremarkable.  Suspicion for acute intra-abdominal emergency is pretty low.  Highly doubt bacterial infection.  Discharged home with Zofran and increase fluids with return precautions. Final Clinical Impression(s) / ED Diagnoses Final diagnoses:  Viral gastroenteritis    Rx / DC Orders ED Discharge Orders         Ordered    ondansetron (ZOFRAN ODT) 4 MG disintegrating tablet  Every 8 hours PRN     12/15/19 1629           Pricilla Loveless, MD 12/16/19 [phone]

## 2019-12-15 NOTE — Discharge Instructions (Signed)
If you develop worsening, continued, or recurrent abdominal pain, uncontrolled vomiting, fever, chest or back pain, or any other new/concerning symptoms then return to the ER for evaluation.  

## 2020-01-30 ENCOUNTER — Encounter (HOSPITAL_COMMUNITY): Payer: Self-pay | Admitting: Emergency Medicine

## 2020-01-30 ENCOUNTER — Emergency Department (HOSPITAL_COMMUNITY): Payer: Self-pay

## 2020-01-30 ENCOUNTER — Other Ambulatory Visit: Payer: Self-pay

## 2020-01-30 ENCOUNTER — Emergency Department (HOSPITAL_COMMUNITY)
Admission: EM | Admit: 2020-01-30 | Discharge: 2020-01-30 | Disposition: A | Discharge status: Home / Self Care | Payer: Self-pay | Attending: Emergency Medicine | Admitting: Emergency Medicine

## 2020-01-30 DIAGNOSIS — J069 Acute upper respiratory infection, unspecified: Secondary | ICD-10-CM | POA: Insufficient documentation

## 2020-01-30 DIAGNOSIS — Z20822 Contact with and (suspected) exposure to covid-19: Secondary | ICD-10-CM | POA: Insufficient documentation

## 2020-01-30 DIAGNOSIS — R0602 Shortness of breath: Secondary | ICD-10-CM | POA: Insufficient documentation

## 2020-01-30 DIAGNOSIS — R05 Cough: Secondary | ICD-10-CM | POA: Insufficient documentation

## 2020-01-30 DIAGNOSIS — Z79899 Other long term (current) drug therapy: Secondary | ICD-10-CM | POA: Insufficient documentation

## 2020-01-30 DIAGNOSIS — R0981 Nasal congestion: Secondary | ICD-10-CM | POA: Insufficient documentation

## 2020-01-30 DIAGNOSIS — J45909 Unspecified asthma, uncomplicated: Secondary | ICD-10-CM | POA: Insufficient documentation

## 2020-01-30 LAB — CBC WITH DIFFERENTIAL/PLATELET
Abs Immature Granulocytes: 0.02 10*3/uL (ref 0.00–0.07)
Basophils Absolute: 0 10*3/uL (ref 0.0–0.1)
Basophils Relative: 0 %
Eosinophils Absolute: 0.2 10*3/uL (ref 0.0–0.5)
Eosinophils Relative: 3 %
HCT: 40.9 % (ref 36.0–46.0)
Hemoglobin: 12.9 g/dL (ref 12.0–15.0)
Immature Granulocytes: 0 %
Lymphocytes Relative: 24 %
Lymphs Abs: 2.1 10*3/uL (ref 0.7–4.0)
MCH: 30.8 pg (ref 26.0–34.0)
MCHC: 31.5 g/dL (ref 30.0–36.0)
MCV: 97.6 fL (ref 80.0–100.0)
Monocytes Absolute: 1 10*3/uL (ref 0.1–1.0)
Monocytes Relative: 11 %
Neutro Abs: 5.5 10*3/uL (ref 1.7–7.7)
Neutrophils Relative %: 62 %
Platelets: 300 10*3/uL (ref 150–400)
RBC: 4.19 MIL/uL (ref 3.87–5.11)
RDW: 12.7 % (ref 11.5–15.5)
WBC: 8.8 10*3/uL (ref 4.0–10.5)
nRBC: 0 % (ref 0.0–0.2)

## 2020-01-30 LAB — BASIC METABOLIC PANEL
Anion gap: 11 (ref 5–15)
BUN: 17 mg/dL (ref 6–20)
CO2: 26 mmol/L (ref 22–32)
Calcium: 9.1 mg/dL (ref 8.9–10.3)
Chloride: 100 mmol/L (ref 98–111)
Creatinine, Ser: 0.59 mg/dL (ref 0.44–1.00)
GFR calc Af Amer: >60 mL/min (ref >60)
GFR calc non Af Amer: >60 mL/min (ref >60)
Glucose, Bld: 85 mg/dL (ref 70–99)
Potassium: 4.1 mmol/L (ref 3.5–5.1)
Sodium: 137 mmol/L (ref 135–145)

## 2020-01-30 LAB — SARS CORONAVIRUS 2 BY RT PCR (HOSPITAL ORDER, PERFORMED IN ~~LOC~~ HOSPITAL LAB): SARS Coronavirus 2: NEGATIVE

## 2020-01-30 LAB — D-DIMER, QUANTITATIVE: D-Dimer, Quant: <0.27 ug/mL-FEU (ref 0.00–0.50)

## 2020-01-30 MED ORDER — ALBUTEROL SULFATE HFA 108 (90 BASE) MCG/ACT IN AERS
4.0000 | INHALATION_SPRAY | Freq: Once | RESPIRATORY_TRACT | Status: AC
  Administered 2020-01-30: 4 via RESPIRATORY_TRACT
  Filled 2020-01-30: qty 6.7

## 2020-01-30 MED ORDER — BENZONATATE 100 MG PO CAPS: 100.0000 mg | ORAL_CAPSULE | Freq: Three times a day (TID) | ORAL | 0 refills | Status: DC

## 2020-01-30 MED ORDER — ALBUTEROL SULFATE HFA 108 (90 BASE) MCG/ACT IN AERS: 2.0000 | INHALATION_SPRAY | Freq: Four times a day (QID) | RESPIRATORY_TRACT | 0 refills | Status: AC | PRN

## 2020-01-30 NOTE — ED Provider Notes (Signed)
[facility] EMERGENCY DEPARTMENT Provider Note   CSN: [id number] Arrival date & time: 01/30/20  9024     History Chief Complaint  Patient presents with  . Shortness of Breath    Jamie Waters is a 29 y.o. female.  Patient with history of asthma, fibromyalgia, IBS here with a 1 week history of chest tightness, shortness of breath and feeling like she cannot get a good breath in.  She normally does not take any asthma medications.  She was seen in urgent care on May 21 and diagnosed with a viral respiratory infection and had a negative Covid test.  He was given albuterol which he has been using every 6 hours as well as a prednisone course which she completed.  Tonight because she felt her breathing was worse today while she was working at [employer] when it was hot.  She feels tight in her chest and short of breath and feels like she cannot get a good breath in.  She has a nonproductive cough and congestion.  No abdominal pain, nausea, vomiting or fever.  Her husband had similar symptoms but has since improved.  She does not smoke.  She has never been hospitalized for her breathing issues.  The history is provided by the patient and the spouse.  Shortness of Breath Associated symptoms: chest pain and cough   Associated symptoms: no abdominal pain, no fever, no headaches, no rash and no vomiting        Past Medical History:  Diagnosis Date  . Anxiety   . Arthritis   . Asthma   . Bipolar II disorder (HCC)   . Depression   . Ectopic fetus   . Ectopic pregnancy   . Fibromyalgia   . IBS (irritable bowel syndrome)   . Miscarriage   . PCOS (polycystic ovarian syndrome) 2015  . PTSD (post-traumatic stress disorder)   . Raynauds syndrome 2015    Patient Active Problem List   Diagnosis Date Noted  . Connective tissue disorder (HCC) 01/17/2017  . Fibromyalgia 11/05/2016  . TMJ click 11/05/2016  . Chronic bilateral low back pain without sciatica 11/05/2016  . Generalized anxiety  disorder 11/05/2016  . Undifferentiated connective tissue disease (HCC) 11/05/2016  . History of multiple miscarriages 11/05/2016  . Migraine without status migrainosus, not intractable 11/05/2016  . Ectopic pregnancy, tubal 09/11/2015  . Ectopic pregnancy 10/30/2014    Past Surgical History:  Procedure Laterality Date  . cyst removed from throat    . DIAGNOSTIC LAPAROSCOPY WITH REMOVAL OF ECTOPIC PREGNANCY    . DIAGNOSTIC LAPAROSCOPY WITH REMOVAL OF ECTOPIC PREGNANCY Left 09/13/2016   Procedure: DIAGNOSTIC LAPAROSCOPY WITH REMOVAL OF ECTOPIC PREGNANCY;  Surgeon: Philip Aspen, DO;  Location: [facility];  Service: Gynecology;  Laterality: Left;  . LAPAROSCOPY N/A 09/11/2015   Procedure: LAPAROSCOPY OPERATIVE, Right Salpingectomy with Removal Ectopic Pregnancy;  Surgeon: Waynard Reeds, MD;  Location: [facility];  Service: Gynecology;  Laterality: N/A;  . THYROIDECTOMY, PARTIAL  2010     OB History    Gravida  4   Para      Term      Preterm      AB  4   Living  0     SAB  1   TAB      Ectopic  3   Multiple  0   Live Births  0           Family History  Problem Relation Age of Onset  . Dementia Father   .  Alcohol abuse Father   . Bipolar disorder Sister   . Autism spectrum disorder Sister   . Lupus Maternal Grandmother   . Cancer Maternal Grandmother   . Cancer Paternal Grandfather     Social History   Tobacco Use  . Smoking status: Never Smoker  . Smokeless tobacco: Never Used  Substance Use Topics  . Alcohol use: No  . Drug use: No    Home Medications Prior to Admission medications   Medication Sig Start Date End Date Taking? Authorizing Provider  albuterol (PROVENTIL HFA;VENTOLIN HFA) 108 (90 Base) MCG/ACT inhaler Inhale 1-2 puffs into the lungs every 6 (six) hours as needed for wheezing or shortness of breath.    [provider]  cetirizine (ZYRTEC) 10 MG tablet Take 1 tablet (10 mg total) by mouth daily. Patient not taking: Reported on  12/15/2019 11/21/18   Sonny Masters, FNP  citalopram (CELEXA) 40 MG tablet Take 1 tablet (40 mg total) by mouth daily. 06/15/19   Mechele Claude, MD  fluticasone (FLONASE) 50 MCG/ACT nasal spray Place 2 sprays into both nostrils daily. Patient not taking: Reported on 12/15/2019 11/21/18   Sonny Masters, FNP  hydrOXYzine (ATARAX/VISTARIL) 10 MG tablet Take one to two tablets twice daily as needed for anxiety or sleep. Do not exceed 4 tabs a day Patient taking differently: Take 10-20 mg by mouth 2 (two) times daily as needed for anxiety. Take one to two tablets twice daily as needed for anxiety or sleep. Do not exceed 4 tabs a day 03/20/18   Burnard Leigh, MD  lamoTRIgine (LAMICTAL) 100 MG tablet Take 1 tablet (100 mg total) by mouth daily. 06/15/19   Mechele Claude, MD  linaclotide (LINZESS) 290 MCG CAPS capsule Take 290 mcg by mouth daily before breakfast.    [provider]  ondansetron (ZOFRAN ODT) 4 MG disintegrating tablet Take 1 tablet (4 mg total) by mouth every 8 (eight) hours as needed for nausea or vomiting. 12/15/19   Pricilla Loveless, MD  polyethylene glycol powder (GLYCOLAX/MIRALAX) 17 GM/SCOOP powder Take 1 Container by mouth daily as needed for mild constipation.    [provider]  promethazine (PHENERGAN) 25 MG tablet Take 1 tablet (25 mg total) by mouth every 6 (six) hours as needed for nausea or vomiting. 06/15/19   Mechele Claude, MD    Allergies    Cymbalta [duloxetine hcl] and Norvasc [amlodipine besylate]  Review of Systems   Review of Systems  Constitutional: Negative for activity change, appetite change, fatigue and fever.  HENT: Positive for congestion and rhinorrhea.   Respiratory: Positive for cough, chest tightness and shortness of breath.   Cardiovascular: Positive for chest pain.  Gastrointestinal: Negative for abdominal pain, nausea and vomiting.  Genitourinary: Negative for dysuria and hematuria.  Musculoskeletal: Negative for arthralgias,  back pain and myalgias.  Skin: Negative for rash.  Neurological: Negative for dizziness, weakness and headaches.   all other systems are negative except as noted in the HPI and PMH.    Physical Exam Updated Vital Signs BP 115/79 (BP Location: Right Arm)   Pulse 88   Temp 98.3 F (36.8 C) (Oral)   Resp 18   Ht 5' (1.524 m)   Wt 56.7 kg   SpO2 100%   BMI 24.41 kg/m   Physical Exam Vitals and nursing note reviewed.  Constitutional:      General: She is not in acute distress.    Appearance: She is well-developed.     Comments: No distress,  speaking full sentences  HENT:     Head: Normocephalic and atraumatic.     Mouth/Throat:     Pharynx: No oropharyngeal exudate.  Eyes:     Conjunctiva/sclera: Conjunctivae normal.     Pupils: Pupils are equal, round, and reactive to light.  Neck:     Comments: No meningismus. Cardiovascular:     Rate and Rhythm: Normal rate and regular rhythm.     Heart sounds: Normal heart sounds. No murmur.  Pulmonary:     Effort: Pulmonary effort is normal. No respiratory distress.     Breath sounds: Normal breath sounds. No wheezing.     Comments: Diminished breath sounds bilaterally Abdominal:     Palpations: Abdomen is soft.     Tenderness: There is no abdominal tenderness. There is no guarding or rebound.  Musculoskeletal:        General: No tenderness. Normal range of motion.     Cervical back: Normal range of motion and neck supple.  Skin:    General: Skin is warm.     Capillary Refill: Capillary refill takes less than 2 seconds.  Neurological:     General: No focal deficit present.     Mental Status: She is alert and oriented to person, place, and time. Mental status is at baseline.     Cranial Nerves: No cranial nerve deficit.     Motor: No abnormal muscle tone.     Coordination: Coordination normal.     Comments: No ataxia on finger to nose bilaterally. No pronator drift. 5/5 strength throughout. CN 2-12 intact.Equal grip strength.  Sensation intact.   Psychiatric:        Behavior: Behavior normal.     ED Results / Procedures / Treatments   Labs (all labs ordered are listed, but only abnormal results are displayed) Labs Reviewed  SARS CORONAVIRUS 2 BY RT PCR (HOSPITAL ORDER, PERFORMED IN [facility])  D-DIMER, QUANTITATIVE (NOT AT [facility])  BASIC METABOLIC PANEL  CBC WITH DIFFERENTIAL/PLATELET    EKG None  Radiology DG Chest 2 View  Result Date: 01/30/2020 CLINICAL DATA:  Cough EXAM: CHEST - 2 VIEW COMPARISON:  04/25/2015 FINDINGS: The heart size and mediastinal contours are within normal limits. Both lungs are clear. The visualized skeletal structures are unremarkable. IMPRESSION: No active cardiopulmonary disease. Electronically Signed   By: Deatra Robinson M.D.   On: 01/30/2020 04:54    Procedures Procedures (including critical care time)  Medications Ordered in ED Medications  albuterol (VENTOLIN HFA) 108 (90 Base) MCG/ACT inhaler 4 puff (has no administration in time range)    ED Course  I have reviewed the triage vital signs and the nursing notes.  Pertinent labs & imaging results that were available during my care of the patient were reviewed by me and considered in my medical decision making (see chart for details).    MDM Rules/Calculators/A&P                     1 week history of chest tightness, cough, shortness of breath.  Recently treated with albuterol and steroids.  No increased work of breathing or hypoxia.  Will check chest x-ray, D-dimer and Covid swab.  Chest x-ray is negative.  D-dimer is negative.  Low suspicion for ACS or PE.  Patient not wheezing on exam.  Good air exchange.  She feels improved after using albuterol in the ED.  Covid swab is negative. No hypoxia or increased work of breathing.  Suspect viral URI  with cough.  We will continue bronchodilators at home.  She just finished steroids so we will not prescribe more steroids.  X-ray does not show any  evidence of pneumonia.  Discussed PCP follow-up and using albuterol every 4 hours as needed, return precautions discussed including chest pain, shortness of breath, fever, other concerns.  ED ECG REPORT   Date: 01/30/2020  Rate: 66  Rhythm: normal sinus rhythm  QRS Axis: normal  Intervals: normal  ST/T Wave abnormalities: normal  Conduction Disutrbances:none  Narrative Interpretation:   Old EKG Reviewed: none available  I have personally reviewed the EKG tracing and agree with the computerized printout as noted.   Jamie Waters was evaluated in Emergency Department on 01/30/2020 for the symptoms described in the history of present illness. She was evaluated in the context of the global COVID-19 pandemic, which necessitated consideration that the patient might be at risk for infection with the SARS-CoV-2 virus that causes COVID-19. Institutional protocols and algorithms that pertain to the evaluation of patients at risk for COVID-19 are in a state of rapid change based on information released by regulatory bodies including the CDC and federal and state organizations. These policies and algorithms were followed during the patient's care in the ED.  Final Clinical Impression(s) / ED Diagnoses Final diagnoses:  Viral URI with cough    Rx / DC Orders ED Discharge Orders    None       Ceazia Harb, Annie Main, MD 01/30/20 [phone]

## 2020-01-30 NOTE — Discharge Instructions (Signed)
Your x-ray is negative.  Test for blood clots is negative. COVID test is negative.  Use the albuterol as needed for difficulty breathing every 4-6 hours.  Follow-up with your primary doctor.  Return to the ED with new or worsening symptoms.

## 2020-01-30 NOTE — ED Triage Notes (Signed)
Pt here for [facility] and tightness in chest. Attempted inhaler at home with no improvement. Pt has hx of asthma.

## 2020-06-21 ENCOUNTER — Other Ambulatory Visit: Payer: Self-pay | Admitting: Family Medicine

## 2020-11-25 ENCOUNTER — Encounter: Payer: Self-pay | Admitting: Family Medicine

## 2020-11-25 ENCOUNTER — Ambulatory Visit: Payer: BC Managed Care – PPO | Admitting: Family Medicine

## 2020-11-25 ENCOUNTER — Other Ambulatory Visit: Payer: Self-pay

## 2020-11-25 VITALS — BP 90/63 | HR 72 | Temp 98.4°F | Ht 60.0 in | Wt 128.1 lb

## 2020-11-25 DIAGNOSIS — M359 Systemic involvement of connective tissue, unspecified: Secondary | ICD-10-CM

## 2020-11-25 DIAGNOSIS — R6889 Other general symptoms and signs: Secondary | ICD-10-CM | POA: Diagnosis not present

## 2020-11-25 DIAGNOSIS — E890 Postprocedural hypothyroidism: Secondary | ICD-10-CM

## 2020-11-25 DIAGNOSIS — K581 Irritable bowel syndrome with constipation: Secondary | ICD-10-CM

## 2020-11-25 DIAGNOSIS — R7303 Prediabetes: Secondary | ICD-10-CM

## 2020-11-25 DIAGNOSIS — Z6825 Body mass index (BMI) 25.0-25.9, adult: Secondary | ICD-10-CM | POA: Diagnosis not present

## 2020-11-25 DIAGNOSIS — J4520 Mild intermittent asthma, uncomplicated: Secondary | ICD-10-CM

## 2020-11-25 DIAGNOSIS — Z136 Encounter for screening for cardiovascular disorders: Secondary | ICD-10-CM | POA: Diagnosis not present

## 2020-11-25 DIAGNOSIS — F332 Major depressive disorder, recurrent severe without psychotic features: Secondary | ICD-10-CM | POA: Diagnosis not present

## 2020-11-25 DIAGNOSIS — F411 Generalized anxiety disorder: Secondary | ICD-10-CM

## 2020-11-25 DIAGNOSIS — F3181 Bipolar II disorder: Secondary | ICD-10-CM | POA: Diagnosis not present

## 2020-11-25 LAB — BAYER DCA HB A1C WAIVED: HB A1C (BAYER DCA - WAIVED): 5.2 % (ref <7.0)

## 2020-11-25 MED ORDER — CITALOPRAM HYDROBROMIDE 40 MG PO TABS: 40.0000 mg | ORAL_TABLET | Freq: Every day | ORAL | 2 refills | Status: DC

## 2020-11-25 MED ORDER — LAMOTRIGINE 100 MG PO TABS: 100.0000 mg | ORAL_TABLET | Freq: Every day | ORAL | 2 refills | Status: DC

## 2020-11-25 NOTE — Patient Instructions (Signed)

## 2020-11-25 NOTE — Progress Notes (Signed)
Established Patient Office Visit  Subjective:  Patient ID: Jamie Waters, female    DOB: [birth year]  Age: 30 y.o. MRN: [id number]  CC:  Chief Complaint  Patient presents with  . Depression    HPI Jamie Waters presents for anxiety and depression.   Jamie Waters has a diagnosis of Bipolar II disorder. She has been taking Celexa 40 mg and Lamictal 100 mg. These medications were previously prescribed by her psychiatrist. Her pscychiatrist's office has recently closed and she has only been taking her medications every other day to make it to this appointment for refills. She denies SI. She is interested in established with a new psychiatrist as well talk therapy.   She sees rheumatology for a connective tissue disorder and fibromyalgia. She sees GI for IBS.   She has had a partial thyroidectomy. She reports her thyroid levels have also been normal.   She has been diagnosed with prediabetes. She has lost weight and changed her diet so she has never been on medication for this. However she reports that her diet lately has not been as well controlled and she has gained some weight.   Depression screen [facility] 2/9 11/25/2020 11/21/2018 09/08/2018  Decreased Interest 3 0 3  Down, Depressed, Hopeless 3 0 3  PHQ - 2 Score 6 0 6  Altered sleeping 3 - 2  Tired, decreased energy 3 - 3  Change in appetite 0 - 0  Feeling bad or failure about yourself  3 - 2  Trouble concentrating 1 - 3  Moving slowly or fidgety/restless 0 - 0  Suicidal thoughts 0 - 0  PHQ-9 Score 16 - 16  Difficult doing work/chores Somewhat difficult - -  Some recent data might be hidden   GAD 7 : Generalized Anxiety Score 11/25/2020 04/22/2017 01/17/2017 11/05/2016  Nervous, Anxious, on Edge 1 2 3 3   Control/stop worrying 3 3 3 3   Worry too much - different things 3 3 3 3   Trouble relaxing 3 3 1 3   Restless 0 0 0 0  Easily annoyed or irritable 1 2 1 3   Afraid - awful might happen 1 2 2 3   Total GAD 7 Score 12 15 13 18   Anxiety  Difficulty Somewhat difficult Somewhat difficult Somewhat difficult Very difficult  Some encounter information is confidential and restricted. Go to Review Flowsheets activity to see all data.      Past Medical History:  Diagnosis Date  . Anxiety   . Arthritis   . Asthma   . Bipolar II disorder ([place])   . Depression   . Ectopic fetus   . Ectopic pregnancy   . Fibromyalgia   . IBS (irritable bowel syndrome)   . Miscarriage   . PCOS (polycystic ovarian syndrome) 2015  . PTSD (post-traumatic stress disorder)   . Raynauds syndrome 2015    Past Surgical History:  Procedure Laterality Date  . cyst removed from throat    . DIAGNOSTIC LAPAROSCOPY WITH REMOVAL OF ECTOPIC PREGNANCY    . DIAGNOSTIC LAPAROSCOPY WITH REMOVAL OF ECTOPIC PREGNANCY Left 09/13/2016   Procedure: DIAGNOSTIC LAPAROSCOPY WITH REMOVAL OF ECTOPIC PREGNANCY;  Surgeon: Allyn Kenner, DO;  Location: [facility];  Service: Gynecology;  Laterality: Left;  . LAPAROSCOPY N/A 09/11/2015   Procedure: LAPAROSCOPY OPERATIVE, Right Salpingectomy with Removal Ectopic Pregnancy;  Surgeon: Vanessa Kick, MD;  Location: [facility];  Service: Gynecology;  Laterality: N/A;  . THYROIDECTOMY, PARTIAL  2010    Family History  Problem Relation Age of Onset  .  Dementia Father   . Alcohol abuse Father   . Bipolar disorder Sister   . Autism spectrum disorder Sister   . Lupus Maternal Grandmother   . Cancer Maternal Grandmother   . Cancer Paternal Grandfather     Social History   Socioeconomic History  . Marital status: Married    Spouse name: Jamie Waters  . Number of children: Not on file  . Years of education: Not on file  . Highest education level: Not on file  Occupational History  . Not on file  Tobacco Use  . Smoking status: Never Smoker  . Smokeless tobacco: Never Used  Vaping Use  . Vaping Use: Never used  Substance and Sexual Activity  . Alcohol use: No  . Drug use: No  . Sexual activity: Yes    Birth control/protection: None   Other Topics Concern  . Not on file  Social History Narrative  . Not on file   Social Determinants of Health   Financial Resource Strain: Not on file  Food Insecurity: Not on file  Transportation Needs: Not on file  Physical Activity: Not on file  Stress: Not on file  Social Connections: Not on file  Intimate Partner Violence: Not on file    Outpatient Medications Prior to Visit  Medication Sig Dispense Refill  . albuterol (VENTOLIN HFA) 108 (90 Base) MCG/ACT inhaler Inhale 2 puffs into the lungs every 6 (six) hours as needed. 6.7 g 0  . citalopram (CELEXA) 40 MG tablet Take 1 tablet (40 mg total) by mouth daily. 30 tablet 2  . cyclobenzaprine (FLEXERIL) 5 MG tablet Take 5 mg by mouth 3 (three) times daily.    Marland Kitchen lamoTRIgine (LAMICTAL) 100 MG tablet Take 1 tablet (100 mg total) by mouth daily. 30 tablet 2  . linaclotide (LINZESS) 290 MCG CAPS capsule Take 290 mcg by mouth daily before breakfast.    . hydroxychloroquine (PLAQUENIL) 200 MG tablet Take 200 mg by mouth 2 (two) times daily. (Patient not taking: Reported on 11/25/2020)    . benzonatate (TESSALON) 100 MG capsule Take 1 capsule (100 mg total) by mouth every 8 (eight) hours. 21 capsule 0  . cetirizine (ZYRTEC) 10 MG tablet Take 1 tablet (10 mg total) by mouth daily. 30 tablet 11  . fluticasone (FLONASE) 50 MCG/ACT nasal spray Place 2 sprays into both nostrils daily. 16 g 6  . hydrOXYzine (ATARAX/VISTARIL) 10 MG tablet Take one to two tablets twice daily as needed for anxiety or sleep. Do not exceed 4 tabs a day (Patient taking differently: Take 10-20 mg by mouth 2 (two) times daily as needed for anxiety. Take one to two tablets twice daily as needed for anxiety or sleep. Do not exceed 4 tabs a day) 120 tablet 0  . ondansetron (ZOFRAN ODT) 4 MG disintegrating tablet Take 1 tablet (4 mg total) by mouth every 8 (eight) hours as needed for nausea or vomiting. 10 tablet 0  . polyethylene glycol powder (GLYCOLAX/MIRALAX) 17 GM/SCOOP  powder Take 1 Container by mouth daily as needed for mild constipation.    . promethazine (PHENERGAN) 25 MG tablet Take 1 tablet (25 mg total) by mouth every 6 (six) hours as needed for nausea or vomiting. 30 tablet 2   No facility-administered medications prior to visit.    Allergies  Allergen Reactions  . Cymbalta [Duloxetine Hcl] Shortness Of Breath  . Norvasc [Amlodipine Besylate] Shortness Of Breath and Other (See Comments)    Reaction:  Tightness in chest  ROS Review of Systems As per HPI.    Objective:    Physical Exam Vitals and nursing note reviewed.  Constitutional:      General: She is not in acute distress.    Appearance: Normal appearance. She is not ill-appearing, toxic-appearing or diaphoretic.  HENT:     Head: Normocephalic and atraumatic.  Eyes:     Extraocular Movements: Extraocular movements intact.     Pupils: Pupils are equal, round, and reactive to light.  Cardiovascular:     Rate and Rhythm: Normal rate and regular rhythm.     Heart sounds: Normal heart sounds. No murmur heard.   Pulmonary:     Effort: Pulmonary effort is normal. No respiratory distress.     Breath sounds: Normal breath sounds.  Musculoskeletal:     Right lower leg: No edema.     Left lower leg: No edema.  Skin:    General: Skin is warm and dry.  Neurological:     General: No focal deficit present.     Mental Status: She is alert and oriented to person, place, and time.     Gait: Gait normal.  Psychiatric:        Mood and Affect: Mood normal.        Behavior: Behavior normal.     BP 90/63   Pulse 72   Temp 98.4 F (36.9 C) (Temporal)   Ht 5' (1.524 m)   Wt 128 lb 2 oz (58.1 kg)   BMI 25.02 kg/m  Wt Readings from Last 3 Encounters:  11/25/20 128 lb 2 oz (58.1 kg)  01/30/20 125 lb (56.7 kg)  12/15/19 130 lb (59 kg)     Health Maintenance Due  Topic Date Due  . Hepatitis C Screening  Never done  . PAP-Cervical Cytology Screening  04/21/2019  . PAP  SMEAR-Modifier  04/21/2019    There are no preventive care reminders to display for this patient.  No results found for: TSH Lab Results  Component Value Date   WBC 8.8 01/30/2020   HGB 12.9 01/30/2020   HCT 40.9 01/30/2020   MCV 97.6 01/30/2020   PLT 300 01/30/2020   Lab Results  Component Value Date   NA 137 01/30/2020   K 4.1 01/30/2020   CO2 26 01/30/2020   GLUCOSE 85 01/30/2020   BUN 17 01/30/2020   CREATININE 0.59 01/30/2020   BILITOT 0.7 12/15/2019   ALKPHOS 49 12/15/2019   AST 18 12/15/2019   ALT 18 12/15/2019   PROT 7.0 12/15/2019   ALBUMIN 4.0 12/15/2019   CALCIUM 9.1 01/30/2020   ANIONGAP 11 01/30/2020   No results found for: CHOL No results found for: HDL No results found for: LDLCALC No results found for: TRIG No results found for: [facility] Lab Results  Component Value Date   HGBA1C 5.3 01/17/2017      Assessment & Plan:   Katelynn was seen today for depression.  Diagnoses and all orders for this visit:  Generalized anxiety disorder Uncontrolled. Patient has not been taking medication as prescribed as there were no refills. Refill provided today. Referrals placed as below.  -     CBC with Differential/Platelet -     CMP14+EGFR -     citalopram (CELEXA) 40 MG tablet; Take 1 tablet (40 mg total) by mouth daily. -     Ambulatory referral to Psychiatry -     Ambulatory referral to Psychology  Severe episode of recurrent major depressive disorder, without psychotic features ([place]) Uncontrolled.  Restart Celexa. Denies SI. Referrals as below. Handout given.  -     CBC with Differential/Platelet -     CMP14+EGFR -     TSH -     VITAMIN D 25 Hydroxy (Vit-D Deficiency, Fractures) -     citalopram (CELEXA) 40 MG tablet; Take 1 tablet (40 mg total) by mouth daily. -     Ambulatory referral to Psychiatry -     Ambulatory referral to Psychology  Bipolar II disorder ([place]) Refills provided while patient is establishing care with psychiatrist. Referrals  placed as below.  -     CBC with Differential/Platelet -     CMP14+EGFR -     citalopram (CELEXA) 40 MG tablet; Take 1 tablet (40 mg total) by mouth daily. -     lamoTRIgine (LAMICTAL) 100 MG tablet; Take 1 tablet (100 mg total) by mouth daily. -     Ambulatory referral to Psychiatry -     Ambulatory referral to Psychology  Postoperative hypothyroidism Labs pending -     TSH  BMI 25.0-25.9,adult Diet and exercise. Labs pending.  -     CBC with Differential/Platelet -     CMP14+EGFR -     Lipid panel -     TSH  Undifferentiated connective tissue disease ([place]) Managed by rheumatology.   Prediabetes A1c pending. Diet and exercise.  -     Bayer DCA Hb A1c Waived  Mild intermittent asthma without complication Well controlled on current regimen.  Irritable bowel syndrome with constipation Managed by GI.    Follow-up: Return in about 4 weeks (around 12/23/2020) for depression/anxiety.   The patient indicates understanding of these issues and agrees with the plan.    Gwenlyn Perking, FNP

## 2020-11-26 LAB — CMP14+EGFR
ALT: 14 IU/L (ref 0–32)
AST: 18 IU/L (ref 0–40)
Albumin/Globulin Ratio: 2.1 (ref 1.2–2.2)
Albumin: 4.8 g/dL (ref 3.9–5.0)
Alkaline Phosphatase: 55 IU/L (ref 44–121)
BUN/Creatinine Ratio: 11 (ref 9–23)
BUN: 7 mg/dL (ref 6–20)
Bilirubin Total: 0.6 mg/dL (ref 0.0–1.2)
CO2: 22 mmol/L (ref 20–29)
Calcium: 9.9 mg/dL (ref 8.7–10.2)
Chloride: 104 mmol/L (ref 96–106)
Creatinine, Ser: 0.61 mg/dL (ref 0.57–1.00)
Globulin, Total: 2.3 g/dL (ref 1.5–4.5)
Glucose: 87 mg/dL (ref 65–99)
Potassium: 4.5 mmol/L (ref 3.5–5.2)
Sodium: 147 mmol/L — ABNORMAL HIGH (ref 134–144)
Total Protein: 7.1 g/dL (ref 6.0–8.5)
eGFR: 124 mL/min/{1.73_m2} (ref >59)

## 2020-11-26 LAB — CBC WITH DIFFERENTIAL/PLATELET
Basophils Absolute: 0 10*3/uL (ref 0.0–0.2)
Basos: 0 %
EOS (ABSOLUTE): 0.1 10*3/uL (ref 0.0–0.4)
Eos: 2 %
Hematocrit: 42.3 % (ref 34.0–46.6)
Hemoglobin: 14 g/dL (ref 11.1–15.9)
Immature Grans (Abs): 0 10*3/uL (ref 0.0–0.1)
Immature Granulocytes: 0 %
Lymphocytes Absolute: 1.5 10*3/uL (ref 0.7–3.1)
Lymphs: 24 %
MCH: 30.8 pg (ref 26.6–33.0)
MCHC: 33.1 g/dL (ref 31.5–35.7)
MCV: 93 fL (ref 79–97)
Monocytes Absolute: 0.5 10*3/uL (ref 0.1–0.9)
Monocytes: 9 %
Neutrophils Absolute: 4.1 10*3/uL (ref 1.4–7.0)
Neutrophils: 65 %
Platelets: 283 10*3/uL (ref 150–450)
RBC: 4.55 x10E6/uL (ref 3.77–5.28)
RDW: 12.8 % (ref 11.7–15.4)
WBC: 6.4 10*3/uL (ref 3.4–10.8)

## 2020-11-26 LAB — LIPID PANEL
Chol/HDL Ratio: 2.5 ratio (ref 0.0–4.4)
Cholesterol, Total: 184 mg/dL (ref 100–199)
HDL: 75 mg/dL (ref >39)
LDL Chol Calc (NIH): 100 mg/dL — ABNORMAL HIGH (ref 0–99)
Triglycerides: 48 mg/dL (ref 0–149)
VLDL Cholesterol Cal: 9 mg/dL (ref 5–40)

## 2020-11-26 LAB — TSH: TSH: 2.11 u[IU]/mL (ref 0.450–4.500)

## 2020-11-26 LAB — VITAMIN D 25 HYDROXY (VIT D DEFICIENCY, FRACTURES): Vit D, 25-Hydroxy: 19.2 ng/mL — ABNORMAL LOW (ref 30.0–100.0)

## 2020-11-28 ENCOUNTER — Other Ambulatory Visit: Payer: Self-pay | Admitting: Family Medicine

## 2020-11-28 DIAGNOSIS — E559 Vitamin D deficiency, unspecified: Secondary | ICD-10-CM

## 2020-11-28 MED ORDER — VITAMIN D (ERGOCALCIFEROL) 1.25 MG (50000 UNIT) PO CAPS: 50000.0000 [IU] | ORAL_CAPSULE | ORAL | 0 refills | Status: DC

## 2020-11-29 DIAGNOSIS — R1011 Right upper quadrant pain: Secondary | ICD-10-CM | POA: Diagnosis not present

## 2020-11-29 DIAGNOSIS — R110 Nausea: Secondary | ICD-10-CM | POA: Diagnosis not present

## 2020-11-29 DIAGNOSIS — K5909 Other constipation: Secondary | ICD-10-CM | POA: Diagnosis not present

## 2020-12-12 ENCOUNTER — Ambulatory Visit: Payer: Self-pay | Admitting: Family Medicine

## 2020-12-20 DIAGNOSIS — Z6825 Body mass index (BMI) 25.0-25.9, adult: Secondary | ICD-10-CM | POA: Diagnosis not present

## 2020-12-20 DIAGNOSIS — Z01419 Encounter for gynecological examination (general) (routine) without abnormal findings: Secondary | ICD-10-CM | POA: Diagnosis not present

## 2020-12-20 DIAGNOSIS — Z124 Encounter for screening for malignant neoplasm of cervix: Secondary | ICD-10-CM | POA: Diagnosis not present

## 2020-12-21 DIAGNOSIS — E288 Other ovarian dysfunction: Secondary | ICD-10-CM | POA: Diagnosis not present

## 2020-12-21 DIAGNOSIS — Z3141 Encounter for fertility testing: Secondary | ICD-10-CM | POA: Diagnosis not present

## 2020-12-21 DIAGNOSIS — Z113 Encounter for screening for infections with a predominantly sexual mode of transmission: Secondary | ICD-10-CM | POA: Diagnosis not present

## 2020-12-21 DIAGNOSIS — N971 Female infertility of tubal origin: Secondary | ICD-10-CM | POA: Diagnosis not present

## 2020-12-23 ENCOUNTER — Encounter: Payer: Self-pay | Admitting: Family Medicine

## 2020-12-23 ENCOUNTER — Ambulatory Visit: Payer: BC Managed Care – PPO | Admitting: Family Medicine

## 2021-01-05 DIAGNOSIS — E288 Other ovarian dysfunction: Secondary | ICD-10-CM | POA: Diagnosis not present

## 2021-01-05 DIAGNOSIS — N971 Female infertility of tubal origin: Secondary | ICD-10-CM | POA: Diagnosis not present

## 2021-01-05 DIAGNOSIS — Z3183 Encounter for assisted reproductive fertility procedure cycle: Secondary | ICD-10-CM | POA: Diagnosis not present

## 2021-01-09 DIAGNOSIS — N971 Female infertility of tubal origin: Secondary | ICD-10-CM | POA: Diagnosis not present

## 2021-01-09 DIAGNOSIS — Z3183 Encounter for assisted reproductive fertility procedure cycle: Secondary | ICD-10-CM | POA: Diagnosis not present

## 2021-01-11 DIAGNOSIS — Z3183 Encounter for assisted reproductive fertility procedure cycle: Secondary | ICD-10-CM | POA: Diagnosis not present

## 2021-01-11 DIAGNOSIS — N971 Female infertility of tubal origin: Secondary | ICD-10-CM | POA: Diagnosis not present

## 2021-01-11 DIAGNOSIS — E288 Other ovarian dysfunction: Secondary | ICD-10-CM | POA: Diagnosis not present

## 2021-01-13 DIAGNOSIS — Z3183 Encounter for assisted reproductive fertility procedure cycle: Secondary | ICD-10-CM | POA: Diagnosis not present

## 2021-01-13 DIAGNOSIS — N971 Female infertility of tubal origin: Secondary | ICD-10-CM | POA: Diagnosis not present

## 2021-01-16 DIAGNOSIS — Z3183 Encounter for assisted reproductive fertility procedure cycle: Secondary | ICD-10-CM | POA: Diagnosis not present

## 2021-01-16 DIAGNOSIS — N971 Female infertility of tubal origin: Secondary | ICD-10-CM | POA: Diagnosis not present

## 2021-01-16 DIAGNOSIS — Z113 Encounter for screening for infections with a predominantly sexual mode of transmission: Secondary | ICD-10-CM | POA: Diagnosis not present

## 2021-01-18 DIAGNOSIS — N971 Female infertility of tubal origin: Secondary | ICD-10-CM | POA: Diagnosis not present

## 2021-01-18 DIAGNOSIS — Z3183 Encounter for assisted reproductive fertility procedure cycle: Secondary | ICD-10-CM | POA: Diagnosis not present

## 2021-01-23 DIAGNOSIS — N971 Female infertility of tubal origin: Secondary | ICD-10-CM | POA: Diagnosis not present

## 2021-01-23 DIAGNOSIS — Z3183 Encounter for assisted reproductive fertility procedure cycle: Secondary | ICD-10-CM | POA: Diagnosis not present

## 2021-01-24 DIAGNOSIS — Z3183 Encounter for assisted reproductive fertility procedure cycle: Secondary | ICD-10-CM | POA: Diagnosis not present

## 2021-01-24 DIAGNOSIS — N971 Female infertility of tubal origin: Secondary | ICD-10-CM | POA: Diagnosis not present

## 2021-02-10 DIAGNOSIS — Z3183 Encounter for assisted reproductive fertility procedure cycle: Secondary | ICD-10-CM | POA: Diagnosis not present

## 2021-02-10 DIAGNOSIS — Z113 Encounter for screening for infections with a predominantly sexual mode of transmission: Secondary | ICD-10-CM | POA: Diagnosis not present

## 2021-02-10 DIAGNOSIS — N971 Female infertility of tubal origin: Secondary | ICD-10-CM | POA: Diagnosis not present

## 2021-02-15 DIAGNOSIS — Z3183 Encounter for assisted reproductive fertility procedure cycle: Secondary | ICD-10-CM | POA: Diagnosis not present

## 2021-02-15 DIAGNOSIS — N971 Female infertility of tubal origin: Secondary | ICD-10-CM | POA: Diagnosis not present

## 2021-02-22 DIAGNOSIS — Z3201 Encounter for pregnancy test, result positive: Secondary | ICD-10-CM | POA: Diagnosis not present

## 2021-02-22 DIAGNOSIS — Z3200 Encounter for pregnancy test, result unknown: Secondary | ICD-10-CM | POA: Diagnosis not present

## 2021-02-24 DIAGNOSIS — Z3201 Encounter for pregnancy test, result positive: Secondary | ICD-10-CM | POA: Diagnosis not present

## 2021-02-24 DIAGNOSIS — Z3200 Encounter for pregnancy test, result unknown: Secondary | ICD-10-CM | POA: Diagnosis not present

## 2021-03-07 ENCOUNTER — Other Ambulatory Visit: Payer: Self-pay | Admitting: Family Medicine

## 2021-03-07 DIAGNOSIS — F411 Generalized anxiety disorder: Secondary | ICD-10-CM

## 2021-03-07 DIAGNOSIS — F332 Major depressive disorder, recurrent severe without psychotic features: Secondary | ICD-10-CM

## 2021-03-07 DIAGNOSIS — F3181 Bipolar II disorder: Secondary | ICD-10-CM

## 2021-03-10 DIAGNOSIS — Z3200 Encounter for pregnancy test, result unknown: Secondary | ICD-10-CM | POA: Diagnosis not present

## 2021-03-24 DIAGNOSIS — O0900 Supervision of pregnancy with history of infertility, unspecified trimester: Secondary | ICD-10-CM | POA: Diagnosis not present

## 2021-04-10 DIAGNOSIS — Z77011 Contact with and (suspected) exposure to lead: Secondary | ICD-10-CM | POA: Diagnosis not present

## 2021-04-10 DIAGNOSIS — O09811 Supervision of pregnancy resulting from assisted reproductive technology, first trimester: Secondary | ICD-10-CM | POA: Diagnosis not present

## 2021-04-10 DIAGNOSIS — Z114 Encounter for screening for human immunodeficiency virus [HIV]: Secondary | ICD-10-CM | POA: Diagnosis not present

## 2021-04-10 DIAGNOSIS — Z3481 Encounter for supervision of other normal pregnancy, first trimester: Secondary | ICD-10-CM | POA: Diagnosis not present

## 2021-04-10 DIAGNOSIS — O09291 Supervision of pregnancy with other poor reproductive or obstetric history, first trimester: Secondary | ICD-10-CM | POA: Diagnosis not present

## 2021-04-17 ENCOUNTER — Other Ambulatory Visit: Payer: Self-pay | Admitting: Family Medicine

## 2021-04-17 DIAGNOSIS — F411 Generalized anxiety disorder: Secondary | ICD-10-CM

## 2021-04-17 DIAGNOSIS — F3181 Bipolar II disorder: Secondary | ICD-10-CM

## 2021-04-17 DIAGNOSIS — F332 Major depressive disorder, recurrent severe without psychotic features: Secondary | ICD-10-CM

## 2021-04-18 NOTE — Telephone Encounter (Signed)
Tiffany NTBS 30 days given 03/08/21

## 2021-04-23 DIAGNOSIS — N390 Urinary tract infection, site not specified: Secondary | ICD-10-CM | POA: Diagnosis not present

## 2021-04-23 DIAGNOSIS — R112 Nausea with vomiting, unspecified: Secondary | ICD-10-CM | POA: Diagnosis not present

## 2021-04-23 DIAGNOSIS — O2341 Unspecified infection of urinary tract in pregnancy, first trimester: Secondary | ICD-10-CM | POA: Diagnosis not present

## 2021-04-23 DIAGNOSIS — O219 Vomiting of pregnancy, unspecified: Secondary | ICD-10-CM | POA: Diagnosis not present

## 2021-04-23 DIAGNOSIS — O26891 Other specified pregnancy related conditions, first trimester: Secondary | ICD-10-CM | POA: Diagnosis not present

## 2021-04-23 DIAGNOSIS — Z3A12 12 weeks gestation of pregnancy: Secondary | ICD-10-CM | POA: Diagnosis not present

## 2021-04-24 DIAGNOSIS — O26891 Other specified pregnancy related conditions, first trimester: Secondary | ICD-10-CM | POA: Diagnosis not present

## 2021-04-28 ENCOUNTER — Encounter: Payer: Self-pay | Admitting: Family Medicine

## 2021-04-28 ENCOUNTER — Other Ambulatory Visit: Payer: Self-pay

## 2021-04-28 ENCOUNTER — Ambulatory Visit: Payer: BC Managed Care – PPO | Admitting: Family Medicine

## 2021-04-28 VITALS — BP 94/63 | HR 86 | Temp 98.1°F | Ht 60.0 in | Wt 148.0 lb

## 2021-04-28 DIAGNOSIS — F3181 Bipolar II disorder: Secondary | ICD-10-CM

## 2021-04-28 DIAGNOSIS — F332 Major depressive disorder, recurrent severe without psychotic features: Secondary | ICD-10-CM | POA: Diagnosis not present

## 2021-04-28 DIAGNOSIS — F411 Generalized anxiety disorder: Secondary | ICD-10-CM

## 2021-04-28 MED ORDER — CITALOPRAM HYDROBROMIDE 40 MG PO TABS: 40.0000 mg | ORAL_TABLET | Freq: Every day | ORAL | 2 refills | Status: AC

## 2021-04-28 MED ORDER — LAMOTRIGINE 100 MG PO TABS: 100.0000 mg | ORAL_TABLET | Freq: Every day | ORAL | 0 refills | Status: AC

## 2021-04-28 NOTE — Progress Notes (Signed)
Established Patient Office Visit  Subjective:  Patient ID: Jamie Waters, female    DOB: 09-08-1990  Age: 30 y.o. MRN: [id number]  CC:  Chief Complaint  Patient presents with   Anxiety    HPI Jamie Waters presents for anxiety and depression follow up. She is currently [redacted] weeks pregnant. She is seeing [facility] in [place]. Her OB is aware that she is on lamictal and celexa and told her that she did not need to discontinue this medication. She did not establish with a psychiatrist as discussed at our last visit for management of her lamictal. She has been getting refills on this from her various providers and reports that she has been taking 100 mg daily.   Depression screen [facility] 2/9 04/28/2021 11/25/2020 11/21/2018  Decreased Interest 1 3 0  Down, Depressed, Hopeless 2 3 0  PHQ - 2 Score 3 6 0  Altered sleeping 2 3 -  Tired, decreased energy 2 3 -  Change in appetite 1 0 -  Feeling bad or failure about yourself  1 3 -  Trouble concentrating 2 1 -  Moving slowly or fidgety/restless 0 0 -  Suicidal thoughts 0 0 -  PHQ-9 Score 11 16 -  Difficult doing work/chores Somewhat difficult Somewhat difficult -  Some recent data might be hidden   GAD 7 : Generalized Anxiety Score 04/28/2021 11/25/2020 04/22/2017 01/17/2017  Nervous, Anxious, on Edge 2 1 2 3   Control/stop worrying 2 3 3 3   Worry too much - different things 2 3 3 3   Trouble relaxing 2 3 3 1   Restless 0 0 0 0  Easily annoyed or irritable 2 1 2 1   Afraid - awful might happen 2 1 2 2   Total GAD 7 Score 12 12 15 13   Anxiety Difficulty Somewhat difficult Somewhat difficult Somewhat difficult Somewhat difficult  Some encounter information is confidential and restricted. Go to Review Flowsheets activity to see all data.      Past Medical History:  Diagnosis Date   Anxiety    Arthritis    Asthma    Bipolar II disorder ([place])    Depression    Ectopic fetus    Ectopic pregnancy    Fibromyalgia    IBS (irritable  bowel syndrome)    Miscarriage    PCOS (polycystic ovarian syndrome) 2015   PTSD (post-traumatic stress disorder)    Raynauds syndrome 2015    Past Surgical History:  Procedure Laterality Date   cyst removed from throat     DIAGNOSTIC LAPAROSCOPY WITH REMOVAL OF ECTOPIC PREGNANCY     DIAGNOSTIC LAPAROSCOPY WITH REMOVAL OF ECTOPIC PREGNANCY Left 09/13/2016   Procedure: DIAGNOSTIC LAPAROSCOPY WITH REMOVAL OF ECTOPIC PREGNANCY;  Surgeon: Allyn Kenner, DO;  Location: [facility];  Service: Gynecology;  Laterality: Left;   LAPAROSCOPY N/A 09/11/2015   Procedure: LAPAROSCOPY OPERATIVE, Right Salpingectomy with Removal Ectopic Pregnancy;  Surgeon: Vanessa Kick, MD;  Location: [facility];  Service: Gynecology;  Laterality: N/A;   THYROIDECTOMY, PARTIAL  2010    Family History  Problem Relation Age of Onset   Dementia Father    Alcohol abuse Father    Bipolar disorder Sister    Autism spectrum disorder Sister    Lupus Maternal Grandmother    Cancer Maternal Grandmother    Cancer Paternal Grandfather     Social History   Socioeconomic History   Marital status: Married    Spouse name: Quillian Quince   Number of children: Not on file  Years of education: Not on file   Highest education level: Not on file  Occupational History   Not on file  Tobacco Use   Smoking status: Never   Smokeless tobacco: Never  Vaping Use   Vaping Use: Never used  Substance and Sexual Activity   Alcohol use: No   Drug use: No   Sexual activity: Yes    Birth control/protection: None  Other Topics Concern   Not on file  Social History Narrative   Not on file   Social Determinants of Health   Financial Resource Strain: Not on file  Food Insecurity: Not on file  Transportation Needs: Not on file  Physical Activity: Not on file  Stress: Not on file  Social Connections: Not on file  Intimate Partner Violence: Not on file    Outpatient Medications Prior to Visit  Medication Sig Dispense Refill   albuterol  (VENTOLIN HFA) 108 (90 Base) MCG/ACT inhaler Inhale 2 puffs into the lungs every 6 (six) hours as needed. 6.7 g 0   cephALEXin (KEFLEX) 500 MG capsule Take 500 mg by mouth 3 (three) times daily.     citalopram (CELEXA) 40 MG tablet Take 1 tablet by mouth once daily 30 tablet 0   folic acid (FOLVITE) 830 MCG tablet Take 400 mcg by mouth daily.     lamoTRIgine (LAMICTAL) 100 MG tablet Take 1 tablet by mouth once daily 30 tablet 0   Prenatal Vit-Fe Fumarate-FA (PRENATAL MULTIVITAMIN) TABS tablet Take 1 tablet by mouth daily at 12 noon.     promethazine (PHENERGAN) 25 MG tablet Take 25 mg by mouth every 6 (six) hours as needed for nausea or vomiting.     cyclobenzaprine (FLEXERIL) 5 MG tablet Take 5 mg by mouth 3 (three) times daily.     hydroxychloroquine (PLAQUENIL) 200 MG tablet Take 200 mg by mouth 2 (two) times daily. (Patient not taking: Reported on 11/25/2020)     linaclotide (LINZESS) 290 MCG CAPS capsule Take 290 mcg by mouth daily before breakfast.     Vitamin D, Ergocalciferol, (DRISDOL) 1.25 MG (50000 UNIT) CAPS capsule Take 1 capsule (50,000 Units total) by mouth every 7 (seven) days. 8 capsule 0   No facility-administered medications prior to visit.    Allergies  Allergen Reactions   Cymbalta [Duloxetine Hcl] Shortness Of Breath   Norvasc [Amlodipine Besylate] Shortness Of Breath and Other (See Comments)    Reaction:  Tightness in chest     ROS Review of Systems As per HPI.   Objective:    Physical Exam Vitals and nursing note reviewed.  Constitutional:      General: She is not in acute distress.    Appearance: She is not ill-appearing, toxic-appearing or diaphoretic.  Cardiovascular:     Rate and Rhythm: Normal rate and regular rhythm.     Heart sounds: Normal heart sounds. No murmur heard. Pulmonary:     Effort: Pulmonary effort is normal. No respiratory distress.     Breath sounds: Normal breath sounds.  Musculoskeletal:     Right lower leg: No edema.     Left  lower leg: No edema.  Skin:    General: Skin is warm and dry.  Neurological:     General: No focal deficit present.     Mental Status: She is alert and oriented to person, place, and time.  Psychiatric:        Mood and Affect: Mood normal.        Behavior: Behavior normal.  BP 94/63   Pulse 86   Temp 98.1 F (36.7 C) (Temporal)   Ht 5' (1.524 m)   Wt 148 lb (67.1 kg)   BMI 28.90 kg/m  Wt Readings from Last 3 Encounters:  04/28/21 148 lb (67.1 kg)  11/25/20 128 lb 2 oz (58.1 kg)  01/30/20 125 lb (56.7 kg)     Health Maintenance Due  Topic Date Due   Hepatitis C Screening  Never done   PAP-Cervical Cytology Screening  04/21/2019   PAP SMEAR-Modifier  04/21/2019   INFLUENZA VACCINE  04/03/2021    There are no preventive care reminders to display for this patient.  Lab Results  Component Value Date   TSH 2.110 11/25/2020   Lab Results  Component Value Date   WBC 6.4 11/25/2020   HGB 14.0 11/25/2020   HCT 42.3 11/25/2020   MCV 93 11/25/2020   PLT 283 11/25/2020   Lab Results  Component Value Date   NA 147 (H) 11/25/2020   K 4.5 11/25/2020   CO2 22 11/25/2020   GLUCOSE 87 11/25/2020   BUN 7 11/25/2020   CREATININE 0.61 11/25/2020   BILITOT 0.6 11/25/2020   ALKPHOS 55 11/25/2020   AST 18 11/25/2020   ALT 14 11/25/2020   PROT 7.1 11/25/2020   ALBUMIN 4.8 11/25/2020   CALCIUM 9.9 11/25/2020   ANIONGAP 11 01/30/2020   EGFR 124 11/25/2020   Lab Results  Component Value Date   CHOL 184 11/25/2020   Lab Results  Component Value Date   HDL 75 11/25/2020   Lab Results  Component Value Date   LDLCALC 100 (H) 11/25/2020   Lab Results  Component Value Date   TRIG 48 11/25/2020   Lab Results  Component Value Date   CHOLHDL 2.5 11/25/2020   Lab Results  Component Value Date   HGBA1C 5.2 11/25/2020      Assessment & Plan:   Tenna was seen today for anxiety.  Diagnoses and all orders for this visit:  Bipolar II disorder ([place]) Refill of  lamictal provided for 30 days to bridge her until she establishes with a psychiatrist for management. Contact information given from [facility] in [place], where patient was previously referred to.  -     lamoTRIgine (LAMICTAL) 100 MG tablet; Take 1 tablet (100 mg total) by mouth daily. -     citalopram (CELEXA) 40 MG tablet; Take 1 tablet (40 mg total) by mouth daily.  Severe episode of recurrent major depressive disorder, without psychotic features ([place]) Improving. Depression specific symptoms on screening have improve. Fatigue, change in appetite etc likely due to current pregnancy. Denies SI.Continue celexa.  -     citalopram (CELEXA) 40 MG tablet; Take 1 tablet (40 mg total) by mouth daily.  Generalized anxiety disorder Improving. Continue celexa.   -     citalopram (CELEXA) 40 MG tablet; Take 1 tablet (40 mg total) by mouth daily.   Follow-up: Return in about 3 months (around 07/29/2021) for medication follow up .   The patient indicates understanding of these issues and agrees with the plan.  Gwenlyn Perking, FNP

## 2021-04-28 NOTE — Patient Instructions (Signed)
Call [facility] in [place] at [phone].

## 2021-05-02 DIAGNOSIS — Z36 Encounter for antenatal screening for chromosomal anomalies: Secondary | ICD-10-CM | POA: Diagnosis not present

## 2021-05-02 DIAGNOSIS — O09291 Supervision of pregnancy with other poor reproductive or obstetric history, first trimester: Secondary | ICD-10-CM | POA: Diagnosis not present

## 2021-05-02 DIAGNOSIS — Z3682 Encounter for antenatal screening for nuchal translucency: Secondary | ICD-10-CM | POA: Diagnosis not present

## 2021-05-02 DIAGNOSIS — Z113 Encounter for screening for infections with a predominantly sexual mode of transmission: Secondary | ICD-10-CM | POA: Diagnosis not present

## 2021-05-17 DIAGNOSIS — Z03818 Encounter for observation for suspected exposure to other biological agents ruled out: Secondary | ICD-10-CM | POA: Diagnosis not present

## 2021-05-17 DIAGNOSIS — Z711 Person with feared health complaint in whom no diagnosis is made: Secondary | ICD-10-CM | POA: Diagnosis not present

## 2021-05-17 DIAGNOSIS — Z20822 Contact with and (suspected) exposure to covid-19: Secondary | ICD-10-CM | POA: Diagnosis not present

## 2021-05-29 DIAGNOSIS — Z363 Encounter for antenatal screening for malformations: Secondary | ICD-10-CM | POA: Diagnosis not present

## 2021-06-05 DIAGNOSIS — Z3482 Encounter for supervision of other normal pregnancy, second trimester: Secondary | ICD-10-CM | POA: Diagnosis not present

## 2021-06-12 DIAGNOSIS — O09813 Supervision of pregnancy resulting from assisted reproductive technology, third trimester: Secondary | ICD-10-CM | POA: Diagnosis not present

## 2021-06-12 DIAGNOSIS — O09292 Supervision of pregnancy with other poor reproductive or obstetric history, second trimester: Secondary | ICD-10-CM | POA: Diagnosis not present

## 2021-06-12 DIAGNOSIS — Z363 Encounter for antenatal screening for malformations: Secondary | ICD-10-CM | POA: Diagnosis not present

## 2021-06-17 DIAGNOSIS — R102 Pelvic and perineal pain: Secondary | ICD-10-CM | POA: Diagnosis not present

## 2021-06-17 DIAGNOSIS — R109 Unspecified abdominal pain: Secondary | ICD-10-CM | POA: Diagnosis not present

## 2021-06-17 DIAGNOSIS — O26892 Other specified pregnancy related conditions, second trimester: Secondary | ICD-10-CM | POA: Diagnosis not present

## 2021-06-17 DIAGNOSIS — Z3A20 20 weeks gestation of pregnancy: Secondary | ICD-10-CM | POA: Diagnosis not present

## 2021-07-10 DIAGNOSIS — O09291 Supervision of pregnancy with other poor reproductive or obstetric history, first trimester: Secondary | ICD-10-CM | POA: Diagnosis not present

## 2021-07-10 DIAGNOSIS — O09812 Supervision of pregnancy resulting from assisted reproductive technology, second trimester: Secondary | ICD-10-CM | POA: Diagnosis not present

## 2021-07-26 ENCOUNTER — Ambulatory Visit: Payer: BC Managed Care – PPO | Admitting: Family Medicine

## 2021-08-11 DIAGNOSIS — Z23 Encounter for immunization: Secondary | ICD-10-CM | POA: Diagnosis not present

## 2021-08-11 DIAGNOSIS — Z3483 Encounter for supervision of other normal pregnancy, third trimester: Secondary | ICD-10-CM | POA: Diagnosis not present

## 2021-08-11 DIAGNOSIS — Z3402 Encounter for supervision of normal first pregnancy, second trimester: Secondary | ICD-10-CM | POA: Diagnosis not present

## 2021-08-22 DIAGNOSIS — O99810 Abnormal glucose complicating pregnancy: Secondary | ICD-10-CM | POA: Diagnosis not present

## 2021-08-31 DIAGNOSIS — O360931 Maternal care for other rhesus isoimmunization, third trimester, fetus 1: Secondary | ICD-10-CM | POA: Diagnosis not present

## 2021-08-31 DIAGNOSIS — Z0183 Encounter for blood typing: Secondary | ICD-10-CM | POA: Diagnosis not present

## 2021-09-08 DIAGNOSIS — O24913 Unspecified diabetes mellitus in pregnancy, third trimester: Secondary | ICD-10-CM | POA: Diagnosis not present

## 2021-09-08 DIAGNOSIS — O09813 Supervision of pregnancy resulting from assisted reproductive technology, third trimester: Secondary | ICD-10-CM | POA: Diagnosis not present

## 2021-09-08 DIAGNOSIS — O26843 Uterine size-date discrepancy, third trimester: Secondary | ICD-10-CM | POA: Diagnosis not present

## 2021-09-08 DIAGNOSIS — O99019 Anemia complicating pregnancy, unspecified trimester: Secondary | ICD-10-CM | POA: Diagnosis not present

## 2021-09-19 DIAGNOSIS — Z3A33 33 weeks gestation of pregnancy: Secondary | ICD-10-CM | POA: Diagnosis not present

## 2021-09-19 DIAGNOSIS — M5459 Other low back pain: Secondary | ICD-10-CM | POA: Diagnosis not present

## 2021-09-19 DIAGNOSIS — R102 Pelvic and perineal pain: Secondary | ICD-10-CM | POA: Diagnosis not present

## 2021-09-19 DIAGNOSIS — O24419 Gestational diabetes mellitus in pregnancy, unspecified control: Secondary | ICD-10-CM | POA: Diagnosis not present

## 2021-09-26 DIAGNOSIS — Z79899 Other long term (current) drug therapy: Secondary | ICD-10-CM | POA: Diagnosis not present

## 2021-09-26 DIAGNOSIS — Z3A34 34 weeks gestation of pregnancy: Secondary | ICD-10-CM | POA: Diagnosis not present

## 2021-09-26 DIAGNOSIS — R519 Headache, unspecified: Secondary | ICD-10-CM | POA: Diagnosis not present

## 2021-09-26 DIAGNOSIS — O2693 Pregnancy related conditions, unspecified, third trimester: Secondary | ICD-10-CM | POA: Diagnosis not present

## 2021-09-26 DIAGNOSIS — D649 Anemia, unspecified: Secondary | ICD-10-CM | POA: Diagnosis not present

## 2021-09-26 DIAGNOSIS — R0602 Shortness of breath: Secondary | ICD-10-CM | POA: Diagnosis not present

## 2021-09-29 DIAGNOSIS — R519 Headache, unspecified: Secondary | ICD-10-CM | POA: Diagnosis not present

## 2021-09-29 DIAGNOSIS — O133 Gestational [pregnancy-induced] hypertension without significant proteinuria, third trimester: Secondary | ICD-10-CM | POA: Diagnosis not present

## 2021-09-29 DIAGNOSIS — O26893 Other specified pregnancy related conditions, third trimester: Secondary | ICD-10-CM | POA: Diagnosis not present

## 2021-09-29 DIAGNOSIS — R110 Nausea: Secondary | ICD-10-CM | POA: Diagnosis not present

## 2021-09-29 DIAGNOSIS — G44209 Tension-type headache, unspecified, not intractable: Secondary | ICD-10-CM | POA: Diagnosis not present

## 2021-09-29 DIAGNOSIS — O210 Mild hyperemesis gravidarum: Secondary | ICD-10-CM | POA: Diagnosis not present

## 2021-09-29 DIAGNOSIS — I10 Essential (primary) hypertension: Secondary | ICD-10-CM | POA: Diagnosis not present

## 2021-09-29 DIAGNOSIS — Z3A35 35 weeks gestation of pregnancy: Secondary | ICD-10-CM | POA: Diagnosis not present

## 2021-09-29 DIAGNOSIS — O163 Unspecified maternal hypertension, third trimester: Secondary | ICD-10-CM | POA: Diagnosis not present

## 2021-09-30 DIAGNOSIS — Z20822 Contact with and (suspected) exposure to covid-19: Secondary | ICD-10-CM | POA: Diagnosis not present

## 2021-09-30 DIAGNOSIS — Z3A35 35 weeks gestation of pregnancy: Secondary | ICD-10-CM | POA: Diagnosis not present

## 2021-09-30 DIAGNOSIS — O368130 Decreased fetal movements, third trimester, not applicable or unspecified: Secondary | ICD-10-CM | POA: Diagnosis not present

## 2021-09-30 DIAGNOSIS — R519 Headache, unspecified: Secondary | ICD-10-CM | POA: Diagnosis not present

## 2021-09-30 DIAGNOSIS — O26893 Other specified pregnancy related conditions, third trimester: Secondary | ICD-10-CM | POA: Diagnosis not present

## 2021-10-01 DIAGNOSIS — O26893 Other specified pregnancy related conditions, third trimester: Secondary | ICD-10-CM | POA: Diagnosis not present

## 2021-10-01 DIAGNOSIS — G44209 Tension-type headache, unspecified, not intractable: Secondary | ICD-10-CM | POA: Diagnosis not present

## 2021-10-01 DIAGNOSIS — Z3A35 35 weeks gestation of pregnancy: Secondary | ICD-10-CM | POA: Diagnosis not present

## 2021-10-02 DIAGNOSIS — R7989 Other specified abnormal findings of blood chemistry: Secondary | ICD-10-CM | POA: Diagnosis not present

## 2021-10-02 DIAGNOSIS — Z3A35 35 weeks gestation of pregnancy: Secondary | ICD-10-CM | POA: Diagnosis not present

## 2021-10-02 DIAGNOSIS — G44209 Tension-type headache, unspecified, not intractable: Secondary | ICD-10-CM | POA: Diagnosis not present

## 2021-10-02 DIAGNOSIS — O219 Vomiting of pregnancy, unspecified: Secondary | ICD-10-CM | POA: Diagnosis not present

## 2021-10-02 DIAGNOSIS — Z79899 Other long term (current) drug therapy: Secondary | ICD-10-CM | POA: Diagnosis not present

## 2021-10-02 DIAGNOSIS — R0602 Shortness of breath: Secondary | ICD-10-CM | POA: Diagnosis not present

## 2021-10-02 DIAGNOSIS — R1084 Generalized abdominal pain: Secondary | ICD-10-CM | POA: Diagnosis not present

## 2021-10-02 DIAGNOSIS — Z20822 Contact with and (suspected) exposure to covid-19: Secondary | ICD-10-CM | POA: Diagnosis not present

## 2021-10-02 DIAGNOSIS — O212 Late vomiting of pregnancy: Secondary | ICD-10-CM | POA: Diagnosis not present

## 2021-10-02 DIAGNOSIS — R519 Headache, unspecified: Secondary | ICD-10-CM | POA: Diagnosis not present

## 2021-10-02 DIAGNOSIS — O26893 Other specified pregnancy related conditions, third trimester: Secondary | ICD-10-CM | POA: Diagnosis not present

## 2021-10-06 DIAGNOSIS — O09293 Supervision of pregnancy with other poor reproductive or obstetric history, third trimester: Secondary | ICD-10-CM | POA: Diagnosis not present

## 2021-10-06 DIAGNOSIS — Z113 Encounter for screening for infections with a predominantly sexual mode of transmission: Secondary | ICD-10-CM | POA: Diagnosis not present

## 2021-10-06 DIAGNOSIS — O24913 Unspecified diabetes mellitus in pregnancy, third trimester: Secondary | ICD-10-CM | POA: Diagnosis not present

## 2021-10-06 DIAGNOSIS — O09813 Supervision of pregnancy resulting from assisted reproductive technology, third trimester: Secondary | ICD-10-CM | POA: Diagnosis not present

## 2021-10-06 DIAGNOSIS — Z3685 Encounter for antenatal screening for Streptococcus B: Secondary | ICD-10-CM | POA: Diagnosis not present

## 2021-10-06 DIAGNOSIS — Z3483 Encounter for supervision of other normal pregnancy, third trimester: Secondary | ICD-10-CM | POA: Diagnosis not present

## 2021-10-12 DIAGNOSIS — O09813 Supervision of pregnancy resulting from assisted reproductive technology, third trimester: Secondary | ICD-10-CM | POA: Diagnosis not present

## 2021-10-12 DIAGNOSIS — O24913 Unspecified diabetes mellitus in pregnancy, third trimester: Secondary | ICD-10-CM | POA: Diagnosis not present

## 2021-10-19 DIAGNOSIS — O99019 Anemia complicating pregnancy, unspecified trimester: Secondary | ICD-10-CM | POA: Diagnosis not present

## 2021-10-19 DIAGNOSIS — Z3A00 Weeks of gestation of pregnancy not specified: Secondary | ICD-10-CM | POA: Diagnosis not present

## 2021-10-20 DIAGNOSIS — O99019 Anemia complicating pregnancy, unspecified trimester: Secondary | ICD-10-CM | POA: Diagnosis not present

## 2021-10-20 DIAGNOSIS — O09813 Supervision of pregnancy resulting from assisted reproductive technology, third trimester: Secondary | ICD-10-CM | POA: Diagnosis not present

## 2021-10-20 DIAGNOSIS — O24913 Unspecified diabetes mellitus in pregnancy, third trimester: Secondary | ICD-10-CM | POA: Diagnosis not present

## 2021-10-20 DIAGNOSIS — O26893 Other specified pregnancy related conditions, third trimester: Secondary | ICD-10-CM | POA: Diagnosis not present

## 2021-10-20 DIAGNOSIS — O09293 Supervision of pregnancy with other poor reproductive or obstetric history, third trimester: Secondary | ICD-10-CM | POA: Diagnosis not present

## 2021-10-20 DIAGNOSIS — Z3A00 Weeks of gestation of pregnancy not specified: Secondary | ICD-10-CM | POA: Diagnosis not present

## 2021-10-24 DIAGNOSIS — O4202 Full-term premature rupture of membranes, onset of labor within 24 hours of rupture: Secondary | ICD-10-CM | POA: Diagnosis not present

## 2021-10-24 DIAGNOSIS — F319 Bipolar disorder, unspecified: Secondary | ICD-10-CM | POA: Diagnosis not present

## 2021-10-24 DIAGNOSIS — Z3A38 38 weeks gestation of pregnancy: Secondary | ICD-10-CM | POA: Diagnosis not present

## 2021-10-24 DIAGNOSIS — N96 Recurrent pregnancy loss: Secondary | ICD-10-CM | POA: Diagnosis not present

## 2021-10-24 DIAGNOSIS — O4290 Premature rupture of membranes, unspecified as to length of time between rupture and onset of labor, unspecified weeks of gestation: Secondary | ICD-10-CM | POA: Diagnosis not present

## 2021-10-24 DIAGNOSIS — E890 Postprocedural hypothyroidism: Secondary | ICD-10-CM | POA: Diagnosis not present

## 2021-10-24 DIAGNOSIS — O9902 Anemia complicating childbirth: Secondary | ICD-10-CM | POA: Diagnosis not present

## 2021-10-24 DIAGNOSIS — O9962 Diseases of the digestive system complicating childbirth: Secondary | ICD-10-CM | POA: Diagnosis not present

## 2021-10-24 DIAGNOSIS — O24420 Gestational diabetes mellitus in childbirth, diet controlled: Secondary | ICD-10-CM | POA: Diagnosis not present

## 2021-10-24 DIAGNOSIS — O99284 Endocrine, nutritional and metabolic diseases complicating childbirth: Secondary | ICD-10-CM | POA: Diagnosis not present

## 2021-10-24 DIAGNOSIS — O411230 Chorioamnionitis, third trimester, not applicable or unspecified: Secondary | ICD-10-CM | POA: Diagnosis not present

## 2021-10-24 DIAGNOSIS — J4520 Mild intermittent asthma, uncomplicated: Secondary | ICD-10-CM | POA: Diagnosis not present

## 2021-10-24 DIAGNOSIS — O99214 Obesity complicating childbirth: Secondary | ICD-10-CM | POA: Diagnosis not present

## 2021-10-24 DIAGNOSIS — O09813 Supervision of pregnancy resulting from assisted reproductive technology, third trimester: Secondary | ICD-10-CM | POA: Diagnosis not present

## 2021-10-24 DIAGNOSIS — O24429 Gestational diabetes mellitus in childbirth, unspecified control: Secondary | ICD-10-CM | POA: Diagnosis not present

## 2021-10-24 DIAGNOSIS — K219 Gastro-esophageal reflux disease without esophagitis: Secondary | ICD-10-CM | POA: Diagnosis not present

## 2021-10-24 DIAGNOSIS — O9952 Diseases of the respiratory system complicating childbirth: Secondary | ICD-10-CM | POA: Diagnosis not present

## 2021-10-24 DIAGNOSIS — O24410 Gestational diabetes mellitus in pregnancy, diet controlled: Secondary | ICD-10-CM | POA: Diagnosis not present

## 2021-10-24 DIAGNOSIS — F411 Generalized anxiety disorder: Secondary | ICD-10-CM | POA: Diagnosis not present

## 2021-10-24 DIAGNOSIS — O99344 Other mental disorders complicating childbirth: Secondary | ICD-10-CM | POA: Diagnosis not present

## 2021-11-02 DIAGNOSIS — N6321 Unspecified lump in the left breast, upper outer quadrant: Secondary | ICD-10-CM | POA: Diagnosis not present

## 2021-11-17 DIAGNOSIS — R300 Dysuria: Secondary | ICD-10-CM | POA: Diagnosis not present

## 2021-11-17 DIAGNOSIS — R102 Pelvic and perineal pain: Secondary | ICD-10-CM | POA: Diagnosis not present

## 2021-12-05 DIAGNOSIS — Z6828 Body mass index (BMI) 28.0-28.9, adult: Secondary | ICD-10-CM | POA: Diagnosis not present

## 2021-12-05 DIAGNOSIS — N760 Acute vaginitis: Secondary | ICD-10-CM | POA: Diagnosis not present

## 2022-01-09 DIAGNOSIS — N941 Unspecified dyspareunia: Secondary | ICD-10-CM | POA: Diagnosis not present

## 2022-01-09 DIAGNOSIS — R102 Pelvic and perineal pain: Secondary | ICD-10-CM | POA: Diagnosis not present

## 2022-01-10 DIAGNOSIS — S3141XA Laceration without foreign body of vagina and vulva, initial encounter: Secondary | ICD-10-CM | POA: Diagnosis not present

## 2022-01-10 DIAGNOSIS — N898 Other specified noninflammatory disorders of vagina: Secondary | ICD-10-CM | POA: Diagnosis not present

## 2022-01-10 DIAGNOSIS — O901 Disruption of perineal obstetric wound: Secondary | ICD-10-CM | POA: Diagnosis not present

## 2022-01-10 DIAGNOSIS — L723 Sebaceous cyst: Secondary | ICD-10-CM | POA: Diagnosis not present

## 2022-03-07 DIAGNOSIS — F411 Generalized anxiety disorder: Secondary | ICD-10-CM | POA: Diagnosis not present

## 2022-04-27 DIAGNOSIS — N951 Menopausal and female climacteric states: Secondary | ICD-10-CM | POA: Diagnosis not present

## 2022-04-27 DIAGNOSIS — Z1329 Encounter for screening for other suspected endocrine disorder: Secondary | ICD-10-CM | POA: Diagnosis not present

## 2022-06-28 ENCOUNTER — Encounter: Payer: Self-pay | Admitting: Family Medicine

## 2022-06-28 ENCOUNTER — Ambulatory Visit (INDEPENDENT_AMBULATORY_CARE_PROVIDER_SITE_OTHER): Payer: BC Managed Care – PPO | Admitting: Family Medicine

## 2022-06-28 VITALS — BP 102/61 | HR 88 | Temp 98.9°F | Ht 60.0 in | Wt 161.2 lb

## 2022-06-28 DIAGNOSIS — Z23 Encounter for immunization: Secondary | ICD-10-CM

## 2022-06-28 DIAGNOSIS — R7303 Prediabetes: Secondary | ICD-10-CM | POA: Diagnosis not present

## 2022-06-28 DIAGNOSIS — E890 Postprocedural hypothyroidism: Secondary | ICD-10-CM

## 2022-06-28 DIAGNOSIS — M359 Systemic involvement of connective tissue, unspecified: Secondary | ICD-10-CM

## 2022-06-28 DIAGNOSIS — Z862 Personal history of diseases of the blood and blood-forming organs and certain disorders involving the immune mechanism: Secondary | ICD-10-CM | POA: Diagnosis not present

## 2022-06-28 DIAGNOSIS — E559 Vitamin D deficiency, unspecified: Secondary | ICD-10-CM | POA: Diagnosis not present

## 2022-06-28 DIAGNOSIS — R6889 Other general symptoms and signs: Secondary | ICD-10-CM | POA: Diagnosis not present

## 2022-06-28 DIAGNOSIS — F3181 Bipolar II disorder: Secondary | ICD-10-CM | POA: Diagnosis not present

## 2022-06-28 DIAGNOSIS — F411 Generalized anxiety disorder: Secondary | ICD-10-CM

## 2022-06-28 DIAGNOSIS — M797 Fibromyalgia: Secondary | ICD-10-CM

## 2022-06-28 NOTE — Progress Notes (Signed)
Established Patient Office Visit  Subjective   Patient ID: Jamie Waters, female    DOB: 21-Nov-1990  Age: 31 y.o. MRN: [id number]  Chief Complaint  Patient presents with   Medical Management of Chronic Issues    HPI Jamie Waters is here for a chronic follow up. She would like to have labs check today. She was anemic during her pregnancy and had to have iron infusion. She is 8 months postpartum now. She has not been taking an iron supplement. She continues to feel sluggish. She had a dental appt recently and dentist asked her if she was still anemic.   She has a history of prediabetes and had gestational diabetes with her pregnancy that was diet controlled. She has not been checking her blood sugars.   She had a partial thyroidectomy in 2010. Her last TSH was normal but this has been over a year ago.   She also has a history of fibromyalgia and a connective tissue disease. She was previously seeing rheumatology for this. There was a question of a lupus diagnosis and at one point she was on plaquenil. She reports daily pain especially in her hips and back. Sometimes in her shoulders and knees. Also chronic fatigue. She sometimes has swelling in feet and hands. She would like a new referral.   She has an appointment next week with psychiatry for management of bipolar disorder, anxiety, and depression.      06/28/2022    4:23 PM 04/28/2021    3:41 PM 11/25/2020   10:53 AM  Depression screen PHQ 2/9  Decreased Interest _0 Down, Depressed, Hopeless _1 PHQ - 2 Score _2 Altered sleeping _3 Tired, decreased energy _4 Change in appetite 1 1 0  Feeling bad or failure about yourself  _5 Trouble concentrating 0 2 1  Moving slowly or fidgety/restless 0 0 0  Suicidal thoughts 0 0 0  PHQ-9 Score _6 Difficult doing work/chores Somewhat difficult Somewhat difficult Somewhat difficult      06/28/2022    4:24 PM 04/28/2021    3:42 PM 11/25/2020   10:55 AM 04/22/2017     8:26 AM  GAD 7 : Generalized Anxiety Score  Nervous, Anxious, on Edge _7 Control/stop worrying _8 Worry too much - different things _9 Trouble relaxing _10 Restless 0 0 0 0  Easily annoyed or irritable _11 Afraid - awful might happen _12 Total GAD 7 Score _13 Anxiety Difficulty Somewhat difficult Somewhat difficult Somewhat difficult Somewhat difficult      ROS As per HPI.    Objective:     BP 102/61   Pulse 88   Temp 98.9 F (37.2 C) (Temporal)   Ht 5' (1.524 m)   Wt 161 lb 4 oz (73.1 kg)   SpO2 97%   BMI 31.49 kg/m    Physical Exam Vitals and nursing note reviewed.  Constitutional:      General: She is not in acute distress.    Appearance: She is not ill-appearing, toxic-appearing or diaphoretic.  HENT:     Nose: Nose normal.  Neck:     Thyroid: No thyroid mass, thyromegaly or thyroid tenderness.  Cardiovascular:     Rate and Rhythm: Normal rate and regular rhythm.  Heart sounds: Normal heart sounds. No murmur heard. Pulmonary:     Effort: Pulmonary effort is normal. No respiratory distress.     Breath sounds: Normal breath sounds.  Abdominal:     General: Bowel sounds are normal. There is no distension.     Palpations: Abdomen is soft.     Tenderness: There is no abdominal tenderness. There is no guarding or rebound.  Musculoskeletal:        General: No swelling.     Right lower leg: No edema.     Left lower leg: No edema.  Skin:    General: Skin is warm and dry.  Neurological:     General: No focal deficit present.     Mental Status: She is alert and oriented to person, place, and time.  Psychiatric:        Mood and Affect: Mood normal.        Behavior: Behavior normal.      No results found for any visits on 06/28/22.    The ASCVD Risk score (Arnett DK, et al., 2019) failed to calculate for the following reasons:   The 2019 ASCVD risk score is only valid for ages 68 to 78    Assessment &  Plan:   Jamie Waters was seen today for medical management of chronic issues.  Diagnoses and all orders for this visit:  Postoperative hypothyroidism Labs pending. Not on medication.  -     TSH -     T4, Free  Prediabetes Diet controlled. Labs pending today.  -     CMP14+EGFR -     Lipid panel -     Bayer DCA Hb A1c Waived  History of iron deficiency anemia During pregnancy. Labs pending.  -     Anemia Profile B  Bipolar II disorder ([place]) Generalized anxiety disorder Managed by psychiatry. Has appointment next week.   Undifferentiated connective tissue disease ([place]) Fibromyalgia Referral placed.  -     Ambulatory referral to Rheumatology  Vitamin D insufficiency Not on supplement. Labs pending.  -     Vitamin D, 25-hydroxy  Need for immunization against influenza -     Flu Vaccine QUAD 68moIM (Fluarix, Fluzone & Alfiuria Quad PF)   Return in about 6 months (around 12/28/2022) for CPE.   The patient indicates understanding of these issues and agrees with the plan.  TGwenlyn Perking FNP

## 2022-06-29 ENCOUNTER — Other Ambulatory Visit: Payer: Self-pay | Admitting: Family Medicine

## 2022-06-29 DIAGNOSIS — Z862 Personal history of diseases of the blood and blood-forming organs and certain disorders involving the immune mechanism: Secondary | ICD-10-CM | POA: Insufficient documentation

## 2022-06-29 DIAGNOSIS — E559 Vitamin D deficiency, unspecified: Secondary | ICD-10-CM

## 2022-06-29 LAB — BAYER DCA HB A1C WAIVED: HB A1C (BAYER DCA - WAIVED): 5.3 % (ref 4.8–5.6)

## 2022-06-29 LAB — LIPID PANEL
Chol/HDL Ratio: 2.6 ratio (ref 0.0–4.4)
Cholesterol, Total: 177 mg/dL (ref 100–199)
HDL: 69 mg/dL (ref >39)
LDL Chol Calc (NIH): 94 mg/dL (ref 0–99)
Triglycerides: 78 mg/dL (ref 0–149)
VLDL Cholesterol Cal: 14 mg/dL (ref 5–40)

## 2022-06-29 LAB — CMP14+EGFR
ALT: 15 IU/L (ref 0–32)
AST: 15 IU/L (ref 0–40)
Albumin/Globulin Ratio: 2.1 (ref 1.2–2.2)
Albumin: 4.8 g/dL (ref 4.0–5.0)
Alkaline Phosphatase: 81 IU/L (ref 44–121)
BUN/Creatinine Ratio: 27 — ABNORMAL HIGH (ref 9–23)
BUN: 17 mg/dL (ref 6–20)
Bilirubin Total: <0.2 mg/dL (ref 0.0–1.2)
CO2: 28 mmol/L (ref 20–29)
Calcium: 9.8 mg/dL (ref 8.7–10.2)
Chloride: 100 mmol/L (ref 96–106)
Creatinine, Ser: 0.64 mg/dL (ref 0.57–1.00)
Globulin, Total: 2.3 g/dL (ref 1.5–4.5)
Glucose: 101 mg/dL — ABNORMAL HIGH (ref 70–99)
Potassium: 4.7 mmol/L (ref 3.5–5.2)
Sodium: 140 mmol/L (ref 134–144)
Total Protein: 7.1 g/dL (ref 6.0–8.5)
eGFR: 122 mL/min/{1.73_m2} (ref >59)

## 2022-06-29 LAB — ANEMIA PROFILE B
Basophils Absolute: 0 10*3/uL (ref 0.0–0.2)
Basos: 0 %
EOS (ABSOLUTE): 0.2 10*3/uL (ref 0.0–0.4)
Eos: 3 %
Ferritin: 15 ng/mL (ref 15–150)
Folate: 11.2 ng/mL (ref >3.0)
Hematocrit: 40.4 % (ref 34.0–46.6)
Hemoglobin: 12.8 g/dL (ref 11.1–15.9)
Immature Grans (Abs): 0 10*3/uL (ref 0.0–0.1)
Immature Granulocytes: 0 %
Iron Saturation: 6 % — CL (ref 15–55)
Iron: 26 ug/dL — ABNORMAL LOW (ref 27–159)
Lymphocytes Absolute: 1.9 10*3/uL (ref 0.7–3.1)
Lymphs: 24 %
MCH: 27.9 pg (ref 26.6–33.0)
MCHC: 31.7 g/dL (ref 31.5–35.7)
MCV: 88 fL (ref 79–97)
Monocytes Absolute: 0.7 10*3/uL (ref 0.1–0.9)
Monocytes: 8 %
Neutrophils Absolute: 5.1 10*3/uL (ref 1.4–7.0)
Neutrophils: 65 %
Platelets: 393 10*3/uL (ref 150–450)
RBC: 4.59 x10E6/uL (ref 3.77–5.28)
RDW: 14.5 % (ref 11.7–15.4)
Retic Ct Pct: 1 % (ref 0.6–2.6)
Total Iron Binding Capacity: 444 ug/dL (ref 250–450)
UIBC: 418 ug/dL (ref 131–425)
Vitamin B-12: 910 pg/mL (ref 232–1245)
WBC: 7.9 10*3/uL (ref 3.4–10.8)

## 2022-06-29 LAB — T4, FREE: Free T4: 0.97 ng/dL (ref 0.82–1.77)

## 2022-06-29 LAB — TSH: TSH: 1.71 u[IU]/mL (ref 0.450–4.500)

## 2022-06-29 LAB — VITAMIN D 25 HYDROXY (VIT D DEFICIENCY, FRACTURES): Vit D, 25-Hydroxy: 22.4 ng/mL — ABNORMAL LOW (ref 30.0–100.0)

## 2022-06-29 MED ORDER — VITAMIN D (ERGOCALCIFEROL) 1.25 MG (50000 UNIT) PO CAPS: 50000.0000 [IU] | ORAL_CAPSULE | ORAL | 0 refills | Status: AC

## 2022-07-03 DIAGNOSIS — F411 Generalized anxiety disorder: Secondary | ICD-10-CM | POA: Diagnosis not present

## 2022-07-03 DIAGNOSIS — F331 Major depressive disorder, recurrent, moderate: Secondary | ICD-10-CM | POA: Diagnosis not present

## 2022-08-02 DIAGNOSIS — F411 Generalized anxiety disorder: Secondary | ICD-10-CM | POA: Diagnosis not present

## 2022-08-02 DIAGNOSIS — F4310 Post-traumatic stress disorder, unspecified: Secondary | ICD-10-CM | POA: Diagnosis not present

## 2022-08-02 DIAGNOSIS — F3181 Bipolar II disorder: Secondary | ICD-10-CM | POA: Diagnosis not present

## 2022-08-02 DIAGNOSIS — F331 Major depressive disorder, recurrent, moderate: Secondary | ICD-10-CM | POA: Diagnosis not present

## 2022-08-30 DIAGNOSIS — F411 Generalized anxiety disorder: Secondary | ICD-10-CM | POA: Diagnosis not present

## 2022-08-30 DIAGNOSIS — F4310 Post-traumatic stress disorder, unspecified: Secondary | ICD-10-CM | POA: Diagnosis not present

## 2022-08-30 DIAGNOSIS — F3181 Bipolar II disorder: Secondary | ICD-10-CM | POA: Diagnosis not present

## 2022-08-30 DIAGNOSIS — F331 Major depressive disorder, recurrent, moderate: Secondary | ICD-10-CM | POA: Diagnosis not present

## 2022-09-26 DIAGNOSIS — F331 Major depressive disorder, recurrent, moderate: Secondary | ICD-10-CM | POA: Diagnosis not present

## 2022-09-26 DIAGNOSIS — F4310 Post-traumatic stress disorder, unspecified: Secondary | ICD-10-CM | POA: Diagnosis not present

## 2022-09-26 DIAGNOSIS — F411 Generalized anxiety disorder: Secondary | ICD-10-CM | POA: Diagnosis not present

## 2022-09-26 DIAGNOSIS — F3181 Bipolar II disorder: Secondary | ICD-10-CM | POA: Diagnosis not present

## 2022-09-28 ENCOUNTER — Encounter: Payer: BC Managed Care – PPO | Admitting: Family Medicine

## 2022-10-25 NOTE — Progress Notes (Signed)
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 Patients states she has had a positive ANA in the past. )   History of Present Illness: Jamie Waters is a 32 y.o. female here for evaluation for joint pains and fatigue with history of fibromyalgia and possible UCTD although mostly not confirmed with previous rheumatology clinic and not on maintenance treatment.  She has pains and lower extremity weakness problems dating back to middle school.  She saw pediatric rheumatology was not found to have any autoimmune disease at that time.  Subsequently has seen rheumatology intermittently due to persistent positive ANA and ongoing joint pain weakness fatigue and rash.  She recently saw Dr. Veneta Penton in 2020 with treatment plan for FMS.  Her most problematic joint pains are throughout the back but also gets trouble with her legs especially in the knees.  Previously was told she has osteoarthritis in her back and knees.  The weakness and joint mobility problem is worse in the past year she is fallen twice once while carrying her infant son which was very alarming.  Previously had sciatica but not recently and does not have any trouble with radiating peripheral numbness. Skin rashes have also been worse in the past year mostly this affects the face.  She gets central redness and heat not usually painful or itchy.  Has not noticed scalp rashes or redness extending into extremities.  This frequently gets worse if she is out in the sun but also just under the lights at her workplace which are fluorescent ceiling lights.  She also notices redness and flushing gets worse with heat alone even  when indoors. Previously tried hydroxychloroquine no improvement in pain or skin rashes.  She also took duloxetine in the past apparently stopped for causing shortness of breath.  But currently on the Celexa for management of her generalized anxiety and bipolar type II disorder. She had a series of first trimester miscarriages with ectopic pregnancy eventually had her son with in vitro fertilization after bilateral fallopian tubes removed.  Does not have history of abnormal bleeding or blood clots.  She reports previously seeing some pallor in her fingers with cold exposure but not lately and no triphasic changes.  Activities of Daily Living:  Patient reports morning stiffness for 1-2 hours.   Patient Reports nocturnal pain.  Difficulty dressing/grooming: Denies Difficulty climbing stairs: Reports Difficulty getting out of chair: Denies Difficulty using hands for taps, buttons, cutlery, and/or writing: Denies  Review of Systems  Constitutional:  Positive for fatigue.  HENT:  Negative for mouth sores and mouth dryness.   Eyes:  Negative for dryness.  Respiratory:  Positive for shortness of breath.   Cardiovascular:  Positive for palpitations. Negative for chest pain.  Gastrointestinal:  Positive for constipation. Negative for blood in stool and diarrhea.  Endocrine: Negative for increased urination.  Genitourinary:  Negative for involuntary urination.  Musculoskeletal:  Positive for joint pain, gait problem, joint pain, joint swelling, myalgias, muscle weakness, morning stiffness, muscle tenderness and myalgias.  Skin:  Positive for color change, rash and sensitivity to sunlight. Negative for hair loss.  Allergic/Immunologic: Negative for susceptible to infections.  Neurological:  Positive for headaches. Negative for dizziness.  Hematological:  Negative for swollen glands.  Psychiatric/Behavioral:  Positive for depressed mood and sleep disturbance. The patient is nervous/anxious.     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   History of multiple miscarriages 11/05/2016   Migraine without status migrainosus, not intractable 11/05/2016   Connective tissue disorder ([place]) 08/24/2016   Bipolar II disorder ([place]) 08/24/2016   Ectopic pregnancy, tubal 09/11/2015   Ectopic pregnancy 10/30/2014   Hypothyroidism 08/08/2011    Past Medical History:  Diagnosis Date   Anxiety    Arthritis    Asthma    Bipolar II disorder ([place])    Depression    Ectopic fetus    Ectopic pregnancy    Fibromyalgia    IBS (irritable bowel syndrome)    Miscarriage    PCOS (polycystic ovarian syndrome) 2015   Post partum depression    PTSD (post-traumatic stress disorder)    Raynauds syndrome 2015    Family History  Problem Relation Age of Onset   Uveitis Mother    Dementia Father    Alcohol abuse Father    Bipolar disorder Sister    Autism spectrum disorder Sister    Lupus Maternal Grandmother    Cancer Maternal Grandmother    Rheum arthritis Maternal Grandmother    Cancer Paternal Grandfather    Past Surgical History:  Procedure Laterality Date   cyst removed from throat     DIAGNOSTIC LAPAROSCOPY WITH REMOVAL OF ECTOPIC PREGNANCY     DIAGNOSTIC LAPAROSCOPY WITH REMOVAL OF ECTOPIC PREGNANCY Left 09/13/2016   Procedure: DIAGNOSTIC LAPAROSCOPY WITH REMOVAL OF ECTOPIC PREGNANCY;  Surgeon: Allyn Kenner, DO;  Location: [facility];  Service: Gynecology;  Laterality: Left;   LAPAROSCOPY N/A 09/11/2015   Procedure: LAPAROSCOPY  OPERATIVE, Right Salpingectomy with Removal Ectopic Pregnancy;  Surgeon: Vanessa Kick, MD;  Location: [facility];  Service: Gynecology;  Laterality: N/A;   THYROIDECTOMY, PARTIAL  2010   Social History   Social History Narrative   Not on file   Immunization History  Administered Date(s) Administered   Influenza, Seasonal, Injecte, Preservative Fre 05/03/2016, 06/03/2016, 06/25/2017, 06/03/2018   Influenza,inj,Quad PF,6+ Mos 09/12/2015, 06/28/2022   Influenza-Unspecified 05/03/2016, 06/25/2017     Objective: Vital Signs: BP 108/74 (BP Location: Right Arm, Patient Position: Sitting, Cuff Size: Normal)   Pulse 85   Resp 14   Ht 5' 1.5" (1.562 m)   Wt 166 lb (75.3 kg)   LMP 10/16/2022   BMI 30.86 kg/m    Physical Exam Eyes:     Conjunctiva/sclera: Conjunctivae normal.  Cardiovascular:     Rate and Rhythm: Normal rate and regular rhythm.  Pulmonary:     Effort: Pulmonary effort is normal.     Breath sounds: Normal breath sounds.  Lymphadenopathy:     Cervical: No cervical adenopathy.  Skin:    General: Skin is warm and dry.     Comments: Central  facial erythema with a few visible telangiectasias, blanching, no papules No digital pitting no skin lesions, normal nailfold capillaroscopy  Neurological:     Mental Status: She is alert.  Psychiatric:        Mood and Affect: Mood normal.      Musculoskeletal Exam:  Shoulders full ROM no tenderness or swelling Elbows full ROM no tenderness or swelling Wrists full ROM no tenderness or swelling Fingers full ROM no tenderness or swelling Diffuse back muscle tenderness to palpation on both sides, with radiation Hip ROM normal, lateral hip pain provoked with movement, tenderness to pressure Knees full ROM no tenderness or swelling Ankles full ROM no tenderness or swelling   Investigation: No additional findings.  Imaging: XR Thoracic Spine 2 View  Result Date: 10/26/2022 X-ray thoracic spine 2 views No abnormality in vertebral  bodies or disc space height.  There is slight rotation is scoliosis present is negligible appearing.  No significant spondylolisthesis.  No acute appearing bony abnormalities. Impression No significant osteoarthritis seen  XR Lumbar Spine 2-3 Views  Result Date: 10/26/2022 X-ray lumbar spine 2 views Very tiny changes for possible early anterior endplate osteophytes starting at level L2.  Normal disc height and no visible spondylolisthesis.  Appears to be partial straightening of the normal lumbar lordosis.  SI joints are patent bilaterally with no significant joint with changes sclerosis or erosions seen. Impression Minimal degenerative changes seen no acute abnormality or spondylolisthesis, no sacroiliitis  XR Cervical Spine 2 or 3 views  Result Date: 10/26/2022 X-ray cervical spine 2 views AP and lateral Postsurgical clips present consistent with history of partial thyroidectomy.  No abnormality of vertebral bodies and disc spaces.  There is some slight rotation present and minimal cervical spine levels with no appreciable spondylolisthesis.  Normal curvature. Impression No significant osteoarthritis   Recent Labs: Lab Results  Component Value Date   WBC 7.9 06/28/2022   HGB 12.8 06/28/2022   PLT 393 06/28/2022   NA 140 06/28/2022   K 4.7 06/28/2022   CL 100 06/28/2022   CO2 28 06/28/2022   GLUCOSE 101 (H) 06/28/2022   BUN 17 06/28/2022   CREATININE 0.64 06/28/2022   BILITOT <0.2 06/28/2022   ALKPHOS 81 06/28/2022   AST 15 06/28/2022   ALT 15 06/28/2022   PROT 7.1 06/28/2022   ALBUMIN 4.8 06/28/2022   CALCIUM 9.8 06/28/2022   GFRAA >60 01/30/2020    Speciality Comments: No specialty comments available.  Procedures:  No procedures performed Allergies: Cymbalta [duloxetine hcl] and Norvasc [amlodipine besylate]   Assessment / Plan:     Visit Diagnoses: Undifferentiated connective tissue disease ([place]) - Plan: Sedimentation rate, C-reactive protein, CK, C3 and C4, Anti-DNA  antibody, double-stranded, ANA  She has had a number of rheumatology evaluation over the years with persistent highly positive ANA and chronic bothersome but mostly nonspecific symptoms.   Will recheck labs today including ANA titer and pattern but also acute indicators including sed rate CRP CK complements and double-stranded DNA antibody.  Previous lack of response to hydroxychloroquine and weight gain and feels poorly with long-term steroids so would have higher threshold to recommend disease specific treatment. May benefit with trial of rosacea specific treatment for the facial rash if unclear.  Fibromyalgia Generalized anxiety disorder  Definitely has fibromyalgia syndrome with constellation of myofascial pain and touch sensitivity, chronic fatigue, headache, irritable bowel symptoms, and depression and anxiety features.  Already on SSRI treatment for the anxiety and bipolar.  Discussed possibly restarting a muscle  relaxant medication at nighttime she is concerned about sedation and if she might not wake up as needed to care for her infant son.  Recommend to check out fibroguide web page for additional self management ideas.  Chronic bilateral back pain, unspecified back location - Plan: XR Cervical Spine 2 or 3 views, XR Thoracic Spine 2 View, XR Lumbar Spine 2-3 Views  Diffuse back pains previously was informed she has osteoarthritis.  X-rays extending from cervical to lumbar spine today with very minimal degenerative disc disease.  Definitely nothing structurally concerning about the sciatica or leg weakness. I believe this is primarily myofascial pain so plan as above as next step.  Vitamin D insufficiency - Plan: VITAMIN D 25 Hydroxy (Vit-D Deficiency, Fractures)  Not on current supplement will check vitamin D level today.  Suspect deficiency with ongoing precautions for UV exposure avoidance.  Orders: Orders Placed This Encounter  Procedures   XR Cervical Spine 2 or 3 views   XR  Thoracic Spine 2 View   XR Lumbar Spine 2-3 Views   Sedimentation rate   C-reactive protein   VITAMIN D 25 Hydroxy (Vit-D Deficiency, Fractures)   CK   C3 and C4   Anti-DNA antibody, double-stranded   ANA   No orders of the defined types were placed in this encounter.    Follow-Up Instructions: Return in about 4 weeks (around 11/23/2022) for New pt f/u 40mo   CCollier Salina MD  Note - This record has been created using [employer]  Chart creation errors have been sought, but may not always  have been located. Such creation errors do not reflect on  the standard of medical care.

## 2022-10-26 ENCOUNTER — Ambulatory Visit (INDEPENDENT_AMBULATORY_CARE_PROVIDER_SITE_OTHER): Payer: BC Managed Care – PPO

## 2022-10-26 ENCOUNTER — Encounter: Payer: Self-pay | Admitting: Internal Medicine

## 2022-10-26 ENCOUNTER — Ambulatory Visit: Payer: BC Managed Care – PPO | Attending: Internal Medicine | Admitting: Internal Medicine

## 2022-10-26 VITALS — BP 108/74 | HR 85 | Resp 14 | Ht 61.5 in | Wt 166.0 lb

## 2022-10-26 DIAGNOSIS — E559 Vitamin D deficiency, unspecified: Secondary | ICD-10-CM

## 2022-10-26 DIAGNOSIS — F411 Generalized anxiety disorder: Secondary | ICD-10-CM

## 2022-10-26 DIAGNOSIS — M359 Systemic involvement of connective tissue, unspecified: Secondary | ICD-10-CM | POA: Diagnosis not present

## 2022-10-26 DIAGNOSIS — G8929 Other chronic pain: Secondary | ICD-10-CM

## 2022-10-26 DIAGNOSIS — M797 Fibromyalgia: Secondary | ICD-10-CM | POA: Diagnosis not present

## 2022-10-26 DIAGNOSIS — M549 Dorsalgia, unspecified: Secondary | ICD-10-CM

## 2022-10-26 NOTE — Patient Instructions (Signed)
I recommend checking out the [employer] patient-centered guide for fibromyalgia and chronic pain management: [email or web address]   

## 2022-10-30 DIAGNOSIS — F411 Generalized anxiety disorder: Secondary | ICD-10-CM | POA: Diagnosis not present

## 2022-10-30 DIAGNOSIS — F331 Major depressive disorder, recurrent, moderate: Secondary | ICD-10-CM | POA: Diagnosis not present

## 2022-10-30 DIAGNOSIS — F4310 Post-traumatic stress disorder, unspecified: Secondary | ICD-10-CM | POA: Diagnosis not present

## 2022-10-30 DIAGNOSIS — F3181 Bipolar II disorder: Secondary | ICD-10-CM | POA: Diagnosis not present

## 2022-10-30 LAB — VITAMIN D 25 HYDROXY (VIT D DEFICIENCY, FRACTURES): Vit D, 25-Hydroxy: 24 ng/mL — ABNORMAL LOW (ref 30–100)

## 2022-10-30 LAB — ANTI-DNA ANTIBODY, DOUBLE-STRANDED: ds DNA Ab: <1 IU/mL

## 2022-10-30 LAB — ANTI-NUCLEAR AB-TITER (ANA TITER)
ANA TITER: 1:80 {titer} — ABNORMAL HIGH
ANA Titer 1: 1:80 {titer} — ABNORMAL HIGH

## 2022-10-30 LAB — C-REACTIVE PROTEIN: CRP: 1 mg/L (ref <8.0)

## 2022-10-30 LAB — ANA: Anti Nuclear Antibody (ANA): POSITIVE — AB

## 2022-10-30 LAB — C3 AND C4
C3 Complement: 147 mg/dL (ref 83–193)
C4 Complement: 31 mg/dL (ref 15–57)

## 2022-10-30 LAB — CK: Total CK: 48 U/L (ref 29–143)

## 2022-10-30 LAB — SEDIMENTATION RATE: Sed Rate: 6 mm/h (ref 0–20)

## 2022-11-26 ENCOUNTER — Ambulatory Visit: Payer: BC Managed Care – PPO | Admitting: Internal Medicine

## 2022-12-06 DIAGNOSIS — F4310 Post-traumatic stress disorder, unspecified: Secondary | ICD-10-CM | POA: Diagnosis not present

## 2022-12-06 DIAGNOSIS — F331 Major depressive disorder, recurrent, moderate: Secondary | ICD-10-CM | POA: Diagnosis not present

## 2022-12-06 DIAGNOSIS — F411 Generalized anxiety disorder: Secondary | ICD-10-CM | POA: Diagnosis not present

## 2022-12-06 DIAGNOSIS — F3181 Bipolar II disorder: Secondary | ICD-10-CM | POA: Diagnosis not present

## 2022-12-13 DIAGNOSIS — Z01419 Encounter for gynecological examination (general) (routine) without abnormal findings: Secondary | ICD-10-CM | POA: Diagnosis not present

## 2022-12-13 DIAGNOSIS — Z6831 Body mass index (BMI) 31.0-31.9, adult: Secondary | ICD-10-CM | POA: Diagnosis not present

## 2022-12-19 DIAGNOSIS — E669 Obesity, unspecified: Secondary | ICD-10-CM | POA: Diagnosis not present

## 2022-12-28 ENCOUNTER — Encounter: Payer: BC Managed Care – PPO | Admitting: Family Medicine

## 2023-01-18 ENCOUNTER — Ambulatory Visit: Payer: BC Managed Care – PPO | Admitting: Internal Medicine

## 2023-01-29 DIAGNOSIS — F3181 Bipolar II disorder: Secondary | ICD-10-CM | POA: Diagnosis not present

## 2023-01-29 DIAGNOSIS — F4310 Post-traumatic stress disorder, unspecified: Secondary | ICD-10-CM | POA: Diagnosis not present

## 2023-01-29 DIAGNOSIS — F411 Generalized anxiety disorder: Secondary | ICD-10-CM | POA: Diagnosis not present

## 2023-02-21 DIAGNOSIS — F3181 Bipolar II disorder: Secondary | ICD-10-CM | POA: Diagnosis not present

## 2023-02-21 DIAGNOSIS — F4310 Post-traumatic stress disorder, unspecified: Secondary | ICD-10-CM | POA: Diagnosis not present

## 2023-02-21 DIAGNOSIS — F411 Generalized anxiety disorder: Secondary | ICD-10-CM | POA: Diagnosis not present

## 2023-02-22 ENCOUNTER — Ambulatory Visit: Payer: BC Managed Care – PPO | Attending: Internal Medicine | Admitting: Internal Medicine

## 2023-02-22 ENCOUNTER — Encounter: Payer: Self-pay | Admitting: Internal Medicine

## 2023-02-22 VITALS — BP 110/76 | HR 79 | Resp 12 | Ht 61.0 in | Wt 169.0 lb

## 2023-02-22 DIAGNOSIS — M549 Dorsalgia, unspecified: Secondary | ICD-10-CM

## 2023-02-22 DIAGNOSIS — M359 Systemic involvement of connective tissue, unspecified: Secondary | ICD-10-CM

## 2023-02-22 DIAGNOSIS — M797 Fibromyalgia: Secondary | ICD-10-CM

## 2023-02-22 DIAGNOSIS — G8929 Other chronic pain: Secondary | ICD-10-CM | POA: Diagnosis not present

## 2023-02-22 MED ORDER — CYCLOBENZAPRINE HCL 5 MG PO TABS: 5.0000 mg | ORAL_TABLET | Freq: Every day | ORAL | 0 refills | Status: AC

## 2023-02-22 NOTE — Progress Notes (Signed)
Office Visit Note  Patient: Jamie Waters             Date of Birth: August 11, 1991           MRN: [id number]             PCP: Gabriel Earing, FNP Referring: Gabriel Earing, FNP Visit Date: 02/22/2023   Subjective:  Follow-up (Patient states her stiffness has gotten worse. Patient states she has trouble lifting things. Patient states she has been nauseous for the past few weeks at night time. )   History of Present Illness: Jamie Waters is a 32 y.o. female here for follow up for ongoing joint and muscle pains fatigue and now with complaint of nausea at nighttime.  Since our last visit she met with nutrition clinic had tried some diet modifications with adding protein and now trying to focus on a lower carbohydrate intake.  Did not have any specific illness or other major medical events.  He is having a lot of pain throughout the upper and lower back and feels her arms and grip are weak or just more difficult to use.  Not specifically describing any numbness is not unintentionally dropping items.  Nauseated sensation at night is new not associated with any chest or abdominal pain or dyspepsia.  Previous HPI 10/26/22 Jamie Waters is a 33 y.o. female here for evaluation for joint pains and fatigue with history of fibromyalgia and possible UCTD although mostly not confirmed with previous rheumatology clinic and not on maintenance treatment.  She has pains and lower extremity weakness problems dating back to middle school.  She saw pediatric rheumatology was not found to have any autoimmune disease at that time.  Subsequently has seen rheumatology intermittently due to persistent positive ANA and ongoing joint pain weakness fatigue and rash.  She recently saw Dr. Allena Napoleon in 2020 with treatment plan for FMS.  Her most problematic joint pains are throughout the back but also gets trouble with her legs especially in the knees.  Previously was told she has osteoarthritis in her back and knees.  The  weakness and joint mobility problem is worse in the past year she is fallen twice once while carrying her infant son which was very alarming.  Previously had sciatica but not recently and does not have any trouble with radiating peripheral numbness. Skin rashes have also been worse in the past year mostly this affects the face.  She gets central redness and heat not usually painful or itchy.  Has not noticed scalp rashes or redness extending into extremities.  This frequently gets worse if she is out in the sun but also just under the lights at her workplace which are fluorescent ceiling lights.  She also notices redness and flushing gets worse with heat alone even when indoors. Previously tried hydroxychloroquine no improvement in pain or skin rashes.  She also took duloxetine in the past apparently stopped for causing shortness of breath.  But currently on the Celexa for management of her generalized anxiety and bipolar type II disorder. She had a series of first trimester miscarriages with ectopic pregnancy eventually had her son with in vitro fertilization after bilateral fallopian tubes removed.  Does not have history of abnormal bleeding or blood clots.  She reports previously seeing some pallor in her fingers with cold exposure but not lately and no triphasic changes.   Review of Systems  Constitutional:  Positive for fatigue.  HENT:  Negative for mouth sores and  mouth dryness.   Eyes:  Negative for dryness.  Respiratory:  Positive for shortness of breath.   Cardiovascular:  Negative for chest pain and palpitations.  Gastrointestinal:  Positive for constipation. Negative for blood in stool and diarrhea.  Endocrine: Positive for increased urination.  Genitourinary:  Negative for involuntary urination.  Musculoskeletal:  Positive for joint pain, gait problem, joint pain, myalgias, muscle weakness, morning stiffness, muscle tenderness and myalgias. Negative for joint swelling.  Skin:  Positive  for color change, rash, hair loss and sensitivity to sunlight.  Allergic/Immunologic: Negative for susceptible to infections.  Neurological:  Negative for dizziness and headaches.  Hematological:  Negative for swollen glands.  Psychiatric/Behavioral:  Positive for depressed mood and sleep disturbance. The patient is nervous/anxious.     PMFS History:  Patient Active Problem List   Diagnosis Date Noted   Chronic back pain 10/26/2022   History of iron deficiency anemia 06/29/2022   Vitamin D insufficiency 06/28/2022   BMI 25.0-25.9,adult 11/25/2020   Prediabetes 11/25/2020   Mild intermittent asthma without complication 11/25/2020   Fibromyalgia 11/05/2016   TMJ click 11/05/2016   Chronic bilateral low back pain without sciatica 11/05/2016   Generalized anxiety disorder 11/05/2016   Undifferentiated connective tissue disease (HCC) 11/05/2016   History of multiple miscarriages 11/05/2016   Migraine without status migrainosus, not intractable 11/05/2016   Connective tissue disorder (HCC) 08/24/2016   Bipolar II disorder ([facility]) 08/24/2016   Ectopic pregnancy, tubal 09/11/2015   Ectopic pregnancy 10/30/2014   Hypothyroidism 08/08/2011    Past Medical History:  Diagnosis Date   Anxiety    Arthritis    Asthma    Bipolar II disorder ([facility])    Depression    Ectopic fetus    Ectopic pregnancy    Fibromyalgia    IBS (irritable bowel syndrome)    Miscarriage    PCOS (polycystic ovarian syndrome) 2015   Post partum depression    PTSD (post-traumatic stress disorder)    Raynauds syndrome 2015    Family History  Problem Relation Age of Onset   Uveitis Mother    Dementia Father    Alcohol abuse Father    Bipolar disorder Sister    Autism spectrum disorder Sister    Lupus Maternal Grandmother    Cancer Maternal Grandmother    Rheum arthritis Maternal Grandmother    Cancer Paternal Grandfather    Past Surgical History:  Procedure Laterality Date   cyst removed from throat      DIAGNOSTIC LAPAROSCOPY WITH REMOVAL OF ECTOPIC PREGNANCY     DIAGNOSTIC LAPAROSCOPY WITH REMOVAL OF ECTOPIC PREGNANCY Left 09/13/2016   Procedure: DIAGNOSTIC LAPAROSCOPY WITH REMOVAL OF ECTOPIC PREGNANCY;  Surgeon: Philip Aspen, DO;  Location: [facility];  Service: Gynecology;  Laterality: Left;   LAPAROSCOPY N/A 09/11/2015   Procedure: LAPAROSCOPY OPERATIVE, Right Salpingectomy with Removal Ectopic Pregnancy;  Surgeon: Waynard Reeds, MD;  Location: [facility];  Service: Gynecology;  Laterality: N/A;   THYROIDECTOMY, PARTIAL  2010   Social History   Social History Narrative   Not on file   Immunization History  Administered Date(s) Administered   Influenza, Seasonal, Injecte, Preservative Fre 05/03/2016, 06/03/2016, 06/25/2017, 06/03/2018   Influenza,inj,Quad PF,6+ Mos 09/12/2015, 06/28/2022   Influenza-Unspecified 05/03/2016, 06/25/2017     Objective: Vital Signs: BP 110/76 (BP Location: Left Arm, Patient Position: Sitting, Cuff Size: Normal)   Pulse 79   Resp 12   Ht 5\' 1"  (1.549 m)   Wt 169 lb (76.7 kg)   LMP 01/18/2023  Breastfeeding No   BMI 31.93 kg/m    Physical Exam Eyes:     Conjunctiva/sclera: Conjunctivae normal.  Cardiovascular:     Rate and Rhythm: Normal rate and regular rhythm.  Pulmonary:     Effort: Pulmonary effort is normal.     Breath sounds: Normal breath sounds.  Musculoskeletal:     Right lower leg: No edema.     Left lower leg: No edema.  Lymphadenopathy:     Cervical: No cervical adenopathy.  Skin:    General: Skin is warm and dry.     Findings: No rash.  Neurological:     Mental Status: She is alert.  Psychiatric:        Mood and Affect: Mood normal.      Musculoskeletal Exam:  Shoulders full ROM, lateral tenderness to pressure Paraspinal muscle tenderness to palpation at the cervical spine extending down to midway along scapulae Elbows full ROM no tenderness or swelling Wrists full ROM no tenderness or swelling Fingers full ROM no tenderness  or swelling  Low back midline and paraspinal muscle tenderness, tenderness at SI joints no radiation Knees full ROM no tenderness or swelling   Investigation: No additional findings.  Imaging: No results found.  Recent Labs: Lab Results  Component Value Date   WBC 7.9 06/28/2022   HGB 12.8 06/28/2022   PLT 393 06/28/2022   NA 140 06/28/2022   K 4.7 06/28/2022   CL 100 06/28/2022   CO2 28 06/28/2022   GLUCOSE 101 (H) 06/28/2022   BUN 17 06/28/2022   CREATININE 0.64 06/28/2022   BILITOT <0.2 06/28/2022   ALKPHOS 81 06/28/2022   AST 15 06/28/2022   ALT 15 06/28/2022   PROT 7.1 06/28/2022   ALBUMIN 4.8 06/28/2022   CALCIUM 9.8 06/28/2022   GFRAA >60 01/30/2020    Speciality Comments: No specialty comments available.  Procedures:  No procedures performed Allergies: Cymbalta [duloxetine hcl] and Norvasc [amlodipine besylate]   Assessment / Plan:     Visit Diagnoses: Fibromyalgia - Plan: cyclobenzaprine (FLEXERIL) 5 MG tablet  Increased pain and fatigue complaint in a broad distribution looks more consistent with some exacerbation of her fibromyalgia syndrome.  We previously discussed trial of adding at night muscle relaxer medicine for some myofascial type pain in the back.  She was concerned about excessive nighttime sedation but now with worsening trouble sleeping and no longer needing frequent nighttime awakening to 10 to her young child will try adding the 5 mg Flexeril at night.  Undifferentiated connective tissue disease (HCC)  Do not see any new evidence of definite joint or skin inflammation on exam today.  Lab testing at initial visit with only borderline elevated antibody markers.  Chronic bilateral back pain, unspecified back location  Provided some printed exercises for shoulder and upper back impingement or myofascial pain.  Recommend we could refer for formal physical therapy if not making adequate progress.  Orders: No orders of the defined types were  placed in this encounter.  Meds ordered this encounter  Medications   cyclobenzaprine (FLEXERIL) 5 MG tablet    Sig: Take 1 tablet (5 mg total) by mouth at bedtime.    Dispense:  30 tablet    Refill:  0     Follow-Up Instructions: No follow-ups on file.   Fuller Plan, MD  Note - This record has been created using [employer].  Chart creation errors have been sought, but may not always  have been located. Such creation errors do  not reflect on  the standard of medical care.

## 2023-02-22 NOTE — Patient Instructions (Signed)
I think your current symptoms are more related to the fibromyalgia syndrome and myofascial pain. I recommend we try adding a muscle relaxer medication at night to decrease this pain and stiffness. Also add some range of motion exercises for the shoulder and upper back.  Keep working on the diet, increasing vegetables and healthy carbohydrates can also improve inflammation.  If symptoms are not making any progress we could refer to physical therapy or a rehab specialist for next steps.  You can reach out to local physical therapy or occupational therapy office about functional capacity exam for the documentation if needing to look into long term disability.

## 2023-03-28 DIAGNOSIS — S0501XA Injury of conjunctiva and corneal abrasion without foreign body, right eye, initial encounter: Secondary | ICD-10-CM | POA: Diagnosis not present

## 2023-04-01 DIAGNOSIS — D8989 Other specified disorders involving the immune mechanism, not elsewhere classified: Secondary | ICD-10-CM | POA: Diagnosis not present

## 2023-04-01 DIAGNOSIS — M797 Fibromyalgia: Secondary | ICD-10-CM | POA: Diagnosis not present

## 2023-04-01 DIAGNOSIS — M359 Systemic involvement of connective tissue, unspecified: Secondary | ICD-10-CM | POA: Diagnosis not present

## 2023-04-01 DIAGNOSIS — R768 Other specified abnormal immunological findings in serum: Secondary | ICD-10-CM | POA: Diagnosis not present

## 2023-04-01 DIAGNOSIS — E559 Vitamin D deficiency, unspecified: Secondary | ICD-10-CM | POA: Diagnosis not present

## 2023-04-04 DIAGNOSIS — F4310 Post-traumatic stress disorder, unspecified: Secondary | ICD-10-CM | POA: Diagnosis not present

## 2023-04-04 DIAGNOSIS — F411 Generalized anxiety disorder: Secondary | ICD-10-CM | POA: Diagnosis not present

## 2023-04-04 DIAGNOSIS — F3181 Bipolar II disorder: Secondary | ICD-10-CM | POA: Diagnosis not present

## 2023-05-07 DIAGNOSIS — F411 Generalized anxiety disorder: Secondary | ICD-10-CM | POA: Diagnosis not present

## 2023-05-07 DIAGNOSIS — F3181 Bipolar II disorder: Secondary | ICD-10-CM | POA: Diagnosis not present

## 2023-05-07 DIAGNOSIS — F4310 Post-traumatic stress disorder, unspecified: Secondary | ICD-10-CM | POA: Diagnosis not present

## 2023-06-06 DIAGNOSIS — F411 Generalized anxiety disorder: Secondary | ICD-10-CM | POA: Diagnosis not present

## 2023-06-06 DIAGNOSIS — F4310 Post-traumatic stress disorder, unspecified: Secondary | ICD-10-CM | POA: Diagnosis not present

## 2023-06-06 DIAGNOSIS — F3181 Bipolar II disorder: Secondary | ICD-10-CM | POA: Diagnosis not present

## 2023-07-09 DIAGNOSIS — F4310 Post-traumatic stress disorder, unspecified: Secondary | ICD-10-CM | POA: Diagnosis not present

## 2023-07-09 DIAGNOSIS — F411 Generalized anxiety disorder: Secondary | ICD-10-CM | POA: Diagnosis not present

## 2023-07-09 DIAGNOSIS — F3181 Bipolar II disorder: Secondary | ICD-10-CM | POA: Diagnosis not present

## 2023-07-18 DIAGNOSIS — Z3141 Encounter for fertility testing: Secondary | ICD-10-CM | POA: Diagnosis not present

## 2023-07-18 DIAGNOSIS — Z319 Encounter for procreative management, unspecified: Secondary | ICD-10-CM | POA: Diagnosis not present

## 2023-08-08 DIAGNOSIS — F411 Generalized anxiety disorder: Secondary | ICD-10-CM | POA: Diagnosis not present

## 2023-08-08 DIAGNOSIS — F3181 Bipolar II disorder: Secondary | ICD-10-CM | POA: Diagnosis not present

## 2023-08-08 DIAGNOSIS — F4310 Post-traumatic stress disorder, unspecified: Secondary | ICD-10-CM | POA: Diagnosis not present

## 2023-08-20 DIAGNOSIS — Z3183 Encounter for assisted reproductive fertility procedure cycle: Secondary | ICD-10-CM | POA: Diagnosis not present

## 2023-08-26 DIAGNOSIS — Z3183 Encounter for assisted reproductive fertility procedure cycle: Secondary | ICD-10-CM | POA: Diagnosis not present

## 2023-08-26 DIAGNOSIS — Z319 Encounter for procreative management, unspecified: Secondary | ICD-10-CM | POA: Diagnosis not present

## 2023-09-02 DIAGNOSIS — Z3183 Encounter for assisted reproductive fertility procedure cycle: Secondary | ICD-10-CM | POA: Diagnosis not present

## 2023-09-09 DIAGNOSIS — F4310 Post-traumatic stress disorder, unspecified: Secondary | ICD-10-CM | POA: Diagnosis not present

## 2023-09-09 DIAGNOSIS — F411 Generalized anxiety disorder: Secondary | ICD-10-CM | POA: Diagnosis not present

## 2023-09-09 DIAGNOSIS — F339 Major depressive disorder, recurrent, unspecified: Secondary | ICD-10-CM | POA: Diagnosis not present

## 2023-09-09 DIAGNOSIS — F3181 Bipolar II disorder: Secondary | ICD-10-CM | POA: Diagnosis not present

## 2023-09-10 DIAGNOSIS — Z3183 Encounter for assisted reproductive fertility procedure cycle: Secondary | ICD-10-CM | POA: Diagnosis not present

## 2023-09-10 DIAGNOSIS — Z3200 Encounter for pregnancy test, result unknown: Secondary | ICD-10-CM | POA: Diagnosis not present

## 2023-09-10 DIAGNOSIS — Z319 Encounter for procreative management, unspecified: Secondary | ICD-10-CM | POA: Diagnosis not present

## 2023-09-30 DIAGNOSIS — N840 Polyp of corpus uteri: Secondary | ICD-10-CM | POA: Diagnosis not present

## 2023-12-18 DIAGNOSIS — F4310 Post-traumatic stress disorder, unspecified: Secondary | ICD-10-CM | POA: Diagnosis not present

## 2023-12-18 DIAGNOSIS — F3181 Bipolar II disorder: Secondary | ICD-10-CM | POA: Diagnosis not present

## 2023-12-18 DIAGNOSIS — F411 Generalized anxiety disorder: Secondary | ICD-10-CM | POA: Diagnosis not present

## 2024-01-14 DIAGNOSIS — F3181 Bipolar II disorder: Secondary | ICD-10-CM | POA: Diagnosis not present

## 2024-01-14 DIAGNOSIS — F4310 Post-traumatic stress disorder, unspecified: Secondary | ICD-10-CM | POA: Diagnosis not present

## 2024-01-14 DIAGNOSIS — F411 Generalized anxiety disorder: Secondary | ICD-10-CM | POA: Diagnosis not present

## 2024-06-24 ENCOUNTER — Ambulatory Visit: Payer: Self-pay

## 2024-06-24 ENCOUNTER — Encounter: Payer: Self-pay | Admitting: Family Medicine

## 2024-06-24 ENCOUNTER — Ambulatory Visit: Admitting: Family Medicine

## 2024-06-24 VITALS — BP 105/66 | HR 89 | Temp 97.9°F | Ht 61.0 in | Wt 211.2 lb

## 2024-06-24 DIAGNOSIS — R6889 Other general symptoms and signs: Secondary | ICD-10-CM | POA: Diagnosis not present

## 2024-06-24 DIAGNOSIS — J029 Acute pharyngitis, unspecified: Secondary | ICD-10-CM

## 2024-06-24 MED ORDER — PROMETHAZINE-DM 6.25-15 MG/5ML PO SYRP: 5.0000 mL | ORAL_SOLUTION | Freq: Two times a day (BID) | ORAL | 0 refills | Status: AC | PRN

## 2024-06-24 MED ORDER — NAPROXEN 500 MG PO TABS: 500.0000 mg | ORAL_TABLET | Freq: Two times a day (BID) | ORAL | 0 refills | Status: AC | PRN

## 2024-06-24 MED ORDER — BENZONATATE 200 MG PO CAPS
200.0000 mg | ORAL_CAPSULE | Freq: Three times a day (TID) | ORAL | 0 refills | Status: AC | PRN
Start: 2024-06-24 — End: ?

## 2024-06-24 MED ORDER — FLUTICASONE PROPIONATE 50 MCG/ACT NA SUSP: 2.0000 | Freq: Every day | NASAL | 6 refills | Status: AC

## 2024-06-24 NOTE — Telephone Encounter (Signed)
 Pt has appt

## 2024-06-24 NOTE — Telephone Encounter (Signed)
 FYI Only or Action Required?: FYI only for provider.  Patient was last seen in primary care on 06/28/2022 by Jamie Annabella HERO, FNP.  Called Nurse Triage reporting Cough.  Symptoms began yesterday.  Interventions attempted: Rest, hydration, or home remedies.  Symptoms are: gradually worsening.  Triage Disposition: See Physician Within 24 Hours  Patient/caregiver understands and will follow disposition?: Yes   Reason for Disposition  SEVERE coughing spells (e.g., whooping sound after coughing, vomiting after coughing)  Answer Assessment - Initial Assessment Questions 1. ONSET: When did the cough begin?      Yesterday but worsening bad 2. SEVERITY: How bad is the cough today?      Coughing around the clock  3. SPUTUM: Describe the color of your sputum (e.g., none, dry cough; clear, white, yellow, green)     No 4. HEMOPTYSIS: Are you coughing up any blood? If Yes, ask: How much? (e.g., flecks, streaks, tablespoons, etc.)     No 5. DIFFICULTY BREATHING: Are you having difficulty breathing? If Yes, ask: How bad is it? (e.g., mild, moderate, severe)      No shortness of breath 6. FEVER: Do you have a fever? If Yes, ask: What is your temperature, how was it measured, and when did it start?     99.5 7. CARDIAC HISTORY: Do you have any history of heart disease? (e.g., heart attack, congestive heart failure)      No 8. LUNG HISTORY: Do you have any history of lung disease?  (e.g., pulmonary embolus, asthma, emphysema)     No 10. OTHER SYMPTOMS: Do you have any other symptoms? (e.g., runny nose, wheezing, chest pain)       Cough, body aches, sore throat 11. PREGNANCY: Is there any chance you are pregnant? When was your last menstrual period?       No  Protocols used: Cough - Acute Productive-A-AH

## 2024-06-24 NOTE — Patient Instructions (Signed)
It appears that you have a viral upper respiratory infection (cold).  Cold symptoms can last up to 2 weeks.   ? ?- Get plenty of rest and drink plenty of fluids. ?- Try to breathe moist air. Use a cold mist humidifier. ?- Consume warm fluids (soup or tea) to provide relief for a stuffy nose and to loosen phlegm. ?- For nasal stuffiness, try saline nasal spray or a Neti Pot. Afrin nasal spray can also be used but this product should not be used longer than 3 days or it will cause rebound nasal stuffiness (worsening nasal congestion). ?- For sore throat pain relief: use chloraseptic spray, suck on throat lozenges, hard candy or popsicles; gargle with warm salt water (1/4 tsp. salt per 8 oz. of water); and eat soft, bland foods. ?- Eat a well-balanced diet. If you cannot, ensure you are getting enough nutrients by taking a daily multivitamin. ?- Avoid dairy products, as they can thicken phlegm. ?- Avoid alcohol, as it impairs your body?s immune system. ? ?CONTACT YOUR DOCTOR IF YOU EXPERIENCE ANY OF THE FOLLOWING: ?- High fever ?- Ear pain ?- Sinus-type headache ?- Unusually severe cold symptoms ?- Cough that gets worse while other cold symptoms improve ?- Flare up of any chronic lung problem, such as asthma ?- Your symptoms persist longer than 2 weeks ? ? ?

## 2024-06-24 NOTE — Progress Notes (Signed)
 Subjective: CC: URI PCP: Joesph Annabella HERO, FNP YEP:Jdyozb Jamie Waters is a 33 y.o. female presenting to clinic today for:  Patient reports that she had sudden onset of cough, congestion, sinus headache and ear fullness yesterday.  She also has sore throat.  Her son is sick with similar as is her husband.  She denies any associated shortness of breath, wheeze or purulence from nares.  She does have a history of asthma but again is not having any shortness of breath.  She has not tried any treatment so far.  Her last menstrual cycle was 04/13/2024 but she notes that she has PCOS and BTL in place.  There is no chance of pregnancy.  Missed work today and needs note   ROS: Per HPI  Allergies  Allergen Reactions   Cymbalta [Duloxetine Hcl] Shortness Of Breath   Norvasc [Amlodipine Besylate] Shortness Of Breath and Other (See Comments)    Reaction:  Tightness in chest    Past Medical History:  Diagnosis Date   Anxiety    Arthritis    Asthma    Bipolar II disorder (HCC)    Depression    Ectopic fetus    Ectopic pregnancy    Fibromyalgia    IBS (irritable bowel syndrome)    Miscarriage    PCOS (polycystic ovarian syndrome) 2015   Post partum depression    PTSD (post-traumatic stress disorder)    Raynauds syndrome 2015    Current Outpatient Medications:    albuterol  (VENTOLIN  HFA) 108 (90 Base) MCG/ACT inhaler, Inhale 2 puffs into the lungs every 6 (six) hours as needed., Disp: 6.7 g, Rfl: 0   ARIPiprazole (ABILIFY) 2 MG tablet, Take 2 mg by mouth daily., Disp: , Rfl:    citalopram  (CELEXA ) 10 MG tablet, Take 10 mg by mouth daily., Disp: , Rfl:    citalopram  (CELEXA ) 40 MG tablet, Take 1 tablet (40 mg total) by mouth daily., Disp: 90 tablet, Rfl: 2   cyclobenzaprine  (FLEXERIL ) 5 MG tablet, Take 1 tablet (5 mg total) by mouth at bedtime., Disp: 30 tablet, Rfl: 0   hydrOXYzine  (ATARAX ) 10 MG tablet, Take by mouth., Disp: , Rfl:    hydrOXYzine  (ATARAX ) 25 MG tablet, Take 25 mg by mouth 2  (two) times daily as needed. (Patient not taking: Reported on 02/22/2023), Disp: , Rfl:    lamoTRIgine  (LAMICTAL ) 100 MG tablet, Take 1 tablet (100 mg total) by mouth daily., Disp: 30 tablet, Rfl: 0   LINZESS 290 MCG CAPS capsule, Take 290 mcg by mouth daily., Disp: , Rfl:    Vitamin D , Ergocalciferol , (DRISDOL ) 1.25 MG (50000 UNIT) CAPS capsule, Take 1 capsule (50,000 Units total) by mouth every 7 (seven) days. (Patient not taking: Reported on 10/26/2022), Disp: 8 capsule, Rfl: 0 Social History   Socioeconomic History   Marital status: Married    Spouse name: Toribio   Number of children: Not on file   Years of education: Not on file   Highest education level: Not on file  Occupational History   Not on file  Tobacco Use   Smoking status: Never    Passive exposure: Current   Smokeless tobacco: Never  Vaping Use   Vaping status: Never Used  Substance and Sexual Activity   Alcohol use: No   Drug use: No   Sexual activity: Yes    Birth control/protection: None  Other Topics Concern   Not on file  Social History Narrative   Not on file   Social Drivers of  Health   Financial Resource Strain: Low Risk  (03/07/2022)   Received from [facility]   Overall Financial Resource Strain (CARDIA)    Difficulty of Paying Living Expenses: Not very hard  Food Insecurity: Low Risk  (03/19/2023)   Received from [facility]   Hunger Vital Sign    Within the past 12 months, you worried that your food would run out before you got money to buy more: Never true    Within the past 12 months, the food you bought just didn't last and you didn't have money to get more. : Never true  Transportation Needs: No Transportation Needs (03/19/2023)   Received from [facility]    In the past 12 months, has lack of reliable transportation kept you from medical appointments, meetings, work or from getting things needed for daily living? : No  Physical Activity: Inactive (03/07/2022)   Received  from [facility]   Exercise Vital Sign    On average, how many days per week do you engage in moderate to strenuous exercise (like a brisk walk)?: 0 days    On average, how many minutes do you engage in exercise at this level?: 0 min  Stress: Stress Concern Present (03/07/2022)   Received from [facility] - Occupational Stress Questionnaire    Feeling of Stress : Rather much  Social Connections: Unknown (03/17/2023)   Received from [facility]   Social Network    Social Network: Not on file  Intimate Partner Violence: Unknown (03/17/2023)   Received from [facility]   HITS    Physically Hurt: Not on file    Insult or Talk Down To: Not on file    Threaten Physical Harm: Not on file    Scream or Curse: Not on file   Family History  Problem Relation Age of Onset   Uveitis Mother    Dementia Father    Alcohol abuse Father    Bipolar disorder Sister    Autism spectrum disorder Sister    Lupus Maternal Grandmother    Cancer Maternal Grandmother    Rheum arthritis Maternal Grandmother    Cancer Paternal Grandfather     Objective: Office vital signs reviewed. BP 105/66   Pulse 89   Temp 97.9 F (36.6 C)   Ht 5' 1 (1.549 m)   Wt 211 lb 4 oz (95.8 kg)   SpO2 96%   BMI 39.92 kg/m   Physical Examination:  General: Awake, alert, tired but nontoxic-appearing female, No acute distress HEENT: Normal    Neck: No masses palpated. No lymphadenopathy    Ears: Tympanic membranes intact, normal light reflex, no erythema, no bulging    Eyes: PERRLA, extraocular membranes intact, sclera white    Nose: nasal turbinates moist, clear nasal discharge    Throat: moist mucus membranes, no erythema, no tonsillar exudate.  Airway is patent Cardio: regular rate and rhythm, S1S2 heard, no murmurs appreciated Pulm: clear to auscultation bilaterally, no wheezes, rhonchi or rales; normal work of breathing on room air  Assessment/ Plan: 33 y.o.  female   Flu-like symptoms - Plan: Veritor Flu A/B Waived, Novel Coronavirus, NAA ([facility]), promethazine -dextromethorphan (PROMETHAZINE -DM) 6.25-15 MG/5ML syrup, naproxen  (NAPROSYN ) 500 MG tablet, benzonatate  (TESSALON ) 200 MG capsule, fluticasone  (FLONASE ) 50 MCG/ACT nasal spray  Sore throat - Plan: Rapid Strep Screen ([facility] ONLY), Veritor Flu A/B Waived, Novel Coronavirus, NAA ([facility])   Negative rapid flu and strep.  COVID sent.  Certainly seems viral at this time.  Cough suppressants provided.  Caution sedation with promethazine  with dextromethorphan.  Flonase  and Naprosyn  sent.  Home care instructions reviewed and reasons for reevaluation discussed.  Discussed signs and symptoms of bacterial infection and she will contact us  if this develops and we can prescribe antibiotics appropriately.  Work note provided.   Norene CHRISTELLA Fielding, DO [facility] [phone]

## 2024-06-25 LAB — VERITOR FLU A/B WAIVED
Influenza A: NEGATIVE
Influenza B: NEGATIVE

## 2024-06-25 LAB — NOVEL CORONAVIRUS, NAA: SARS-CoV-2, NAA: NOT DETECTED

## 2024-06-25 LAB — CULTURE, GROUP A STREP

## 2024-06-25 LAB — RAPID STREP SCREEN (MED CTR MEBANE ONLY): Strep Gp A Ag, IA W/Reflex: NEGATIVE

## 2024-06-26 ENCOUNTER — Ambulatory Visit: Payer: Self-pay | Admitting: Family Medicine

## 2024-07-03 DIAGNOSIS — Z01411 Encounter for gynecological examination (general) (routine) with abnormal findings: Secondary | ICD-10-CM | POA: Diagnosis not present

## 2024-07-03 DIAGNOSIS — L2989 Other pruritus: Secondary | ICD-10-CM | POA: Diagnosis not present

## 2024-07-03 DIAGNOSIS — Z0000 Encounter for general adult medical examination without abnormal findings: Secondary | ICD-10-CM | POA: Diagnosis not present

## 2024-07-03 DIAGNOSIS — R21 Rash and other nonspecific skin eruption: Secondary | ICD-10-CM | POA: Diagnosis not present

## 2024-07-03 DIAGNOSIS — L680 Hirsutism: Secondary | ICD-10-CM | POA: Diagnosis not present

## 2024-07-03 DIAGNOSIS — Z6841 Body Mass Index (BMI) 40.0 and over, adult: Secondary | ICD-10-CM | POA: Diagnosis not present

## 2024-07-03 DIAGNOSIS — Z87898 Personal history of other specified conditions: Secondary | ICD-10-CM | POA: Diagnosis not present

## 2024-07-03 DIAGNOSIS — Z13220 Encounter for screening for lipoid disorders: Secondary | ICD-10-CM | POA: Diagnosis not present

## 2024-08-23 DIAGNOSIS — J101 Influenza due to other identified influenza virus with other respiratory manifestations: Secondary | ICD-10-CM | POA: Diagnosis not present

## 2024-08-23 DIAGNOSIS — R051 Acute cough: Secondary | ICD-10-CM | POA: Diagnosis not present

## 2024-08-23 DIAGNOSIS — R110 Nausea: Secondary | ICD-10-CM | POA: Diagnosis not present

## 2024-08-23 DIAGNOSIS — R0981 Nasal congestion: Secondary | ICD-10-CM | POA: Diagnosis not present
